# Patient Record
Sex: Female | Born: 1970
Health system: Southern US, Community
[De-identification: ages and names within clinical notes are randomized; demographics above are authoritative.]

## PROBLEM LIST (undated history)

## (undated) DIAGNOSIS — U071 COVID-19: Secondary | ICD-10-CM

## (undated) DIAGNOSIS — J189 Pneumonia, unspecified organism: Secondary | ICD-10-CM

## (undated) DIAGNOSIS — Z87442 Personal history of urinary calculi: Secondary | ICD-10-CM

## (undated) DIAGNOSIS — E785 Hyperlipidemia, unspecified: Secondary | ICD-10-CM

## (undated) DIAGNOSIS — R7303 Prediabetes: Secondary | ICD-10-CM

## (undated) DIAGNOSIS — B019 Varicella without complication: Secondary | ICD-10-CM

## (undated) DIAGNOSIS — M65312 Trigger thumb, left thumb: Secondary | ICD-10-CM

## (undated) DIAGNOSIS — K219 Gastro-esophageal reflux disease without esophagitis: Secondary | ICD-10-CM

## (undated) DIAGNOSIS — K769 Liver disease, unspecified: Secondary | ICD-10-CM

## (undated) HISTORY — DX: Hyperlipidemia, unspecified: E78.5

## (undated) HISTORY — DX: Prediabetes: R73.03

## (undated) HISTORY — DX: COVID-19: U07.1

## (undated) HISTORY — DX: Varicella without complication: B01.9

## (undated) HISTORY — PX: WISDOM TOOTH EXTRACTION: SHX21

## (undated) HISTORY — PX: ABDOMINAL HYSTERECTOMY: SHX81

## (undated) HISTORY — PX: OTHER SURGICAL HISTORY: SHX169

## (undated) HISTORY — DX: Liver disease, unspecified: K76.9

## (undated) HISTORY — PX: SHOULDER OPEN ROTATOR CUFF REPAIR: SHX2407

## (undated) HISTORY — PX: BACK SURGERY: SHX140

---

## 1994-03-24 DIAGNOSIS — Z8489 Family history of other specified conditions: Secondary | ICD-10-CM

## 1994-03-24 HISTORY — DX: Family history of other specified conditions: Z84.89

## 2006-06-23 ENCOUNTER — Ambulatory Visit: Payer: Self-pay | Admitting: Family Medicine

## 2006-08-26 ENCOUNTER — Ambulatory Visit: Payer: Self-pay | Admitting: Nurse Practitioner

## 2006-08-27 ENCOUNTER — Ambulatory Visit: Payer: Self-pay | Admitting: Family Medicine

## 2006-09-01 ENCOUNTER — Ambulatory Visit: Payer: Self-pay | Admitting: Nurse Practitioner

## 2006-11-12 ENCOUNTER — Ambulatory Visit: Payer: Self-pay | Admitting: Family Medicine

## 2008-04-13 ENCOUNTER — Ambulatory Visit: Payer: Self-pay | Admitting: Internal Medicine

## 2008-07-11 ENCOUNTER — Ambulatory Visit: Payer: Self-pay | Admitting: Family Medicine

## 2010-05-09 ENCOUNTER — Ambulatory Visit: Payer: Self-pay | Admitting: Family Medicine

## 2010-11-24 ENCOUNTER — Ambulatory Visit: Payer: Self-pay

## 2011-06-11 ENCOUNTER — Emergency Department: Payer: Self-pay | Admitting: Emergency Medicine

## 2011-06-11 LAB — COMPREHENSIVE METABOLIC PANEL
Albumin: 3.4 g/dL (ref 3.4–5.0)
Alkaline Phosphatase: 85 U/L (ref 50–136)
BUN: 12 mg/dL (ref 7–18)
Bilirubin,Total: 0.3 mg/dL (ref 0.2–1.0)
Chloride: 107 mmol/L (ref 98–107)
Creatinine: 0.81 mg/dL (ref 0.60–1.30)
Glucose: 98 mg/dL (ref 65–99)
SGPT (ALT): 19 U/L
Total Protein: 7.3 g/dL (ref 6.4–8.2)

## 2011-06-11 LAB — CK TOTAL AND CKMB (NOT AT ARMC)
CK, Total: 111 U/L (ref 21–215)
CK-MB: 0.8 ng/mL (ref 0.5–3.6)

## 2011-06-11 LAB — CBC
HGB: 14.2 g/dL (ref 12.0–16.0)
Platelet: 314 10*3/uL (ref 150–440)
RBC: 4.73 10*6/uL (ref 3.80–5.20)
WBC: 5.4 10*3/uL (ref 3.6–11.0)

## 2011-06-11 LAB — TROPONIN I: Troponin-I: <0.02 ng/mL

## 2011-06-11 LAB — PRO B NATRIURETIC PEPTIDE: B-Type Natriuretic Peptide: 20 pg/mL (ref 0–125)

## 2012-01-12 ENCOUNTER — Ambulatory Visit: Payer: Self-pay | Admitting: Internal Medicine

## 2013-01-04 ENCOUNTER — Emergency Department: Payer: Self-pay | Admitting: Emergency Medicine

## 2013-01-04 LAB — URINALYSIS, COMPLETE
Blood: NEGATIVE
Glucose,UR: NEGATIVE mg/dL (ref 0–75)
Leukocyte Esterase: NEGATIVE
Ph: 8 (ref 4.5–8.0)
Protein: NEGATIVE
RBC,UR: NONE SEEN /HPF (ref 0–5)
Specific Gravity: 1.004 (ref 1.003–1.030)
Squamous Epithelial: <1
WBC UR: NONE SEEN /HPF (ref 0–5)

## 2013-11-03 ENCOUNTER — Emergency Department: Payer: Self-pay | Admitting: Emergency Medicine

## 2013-11-04 LAB — CBC
HCT: 43.4 % (ref 35.0–47.0)
HGB: 14.3 g/dL (ref 12.0–16.0)
MCH: 29.5 pg (ref 26.0–34.0)
MCHC: 32.9 g/dL (ref 32.0–36.0)
MCV: 90 fL (ref 80–100)
Platelet: 299 10*3/uL (ref 150–440)
RBC: 4.84 10*6/uL (ref 3.80–5.20)
RDW: 13.8 % (ref 11.5–14.5)
WBC: 8.4 10*3/uL (ref 3.6–11.0)

## 2013-11-04 LAB — BASIC METABOLIC PANEL
ANION GAP: 8 (ref 7–16)
BUN: 13 mg/dL (ref 7–18)
Calcium, Total: 8.9 mg/dL (ref 8.5–10.1)
Chloride: 108 mmol/L — ABNORMAL HIGH (ref 98–107)
Co2: 24 mmol/L (ref 21–32)
Creatinine: 0.98 mg/dL (ref 0.60–1.30)
EGFR (African American): >60
EGFR (Non-African Amer.): >60
Glucose: 183 mg/dL — ABNORMAL HIGH (ref 65–99)
Osmolality: 284 (ref 275–301)
POTASSIUM: 3.7 mmol/L (ref 3.5–5.1)
Sodium: 140 mmol/L (ref 136–145)

## 2013-11-04 LAB — TROPONIN I: Troponin-I: <0.02 ng/mL

## 2015-02-01 LAB — HM MAMMOGRAPHY

## 2015-02-01 LAB — HM PAP SMEAR: HM Pap smear: NEGATIVE

## 2015-04-26 ENCOUNTER — Encounter: Payer: Self-pay | Admitting: Internal Medicine

## 2015-06-07 ENCOUNTER — Ambulatory Visit
Admission: RE | Admit: 2015-06-07 | Discharge: 2015-06-07 | Disposition: A | Discharge status: Home / Self Care | Payer: BLUE CROSS/BLUE SHIELD | Source: Ambulatory Visit | Attending: Urology | Admitting: Urology

## 2015-06-07 ENCOUNTER — Encounter: Payer: Self-pay | Admitting: *Deleted

## 2015-06-07 ENCOUNTER — Encounter
Admission: RE | Disposition: A | Discharge status: Home / Self Care | Payer: Self-pay | Source: Ambulatory Visit | Attending: Urology

## 2015-06-07 DIAGNOSIS — Z841 Family history of disorders of kidney and ureter: Secondary | ICD-10-CM | POA: Insufficient documentation

## 2015-06-07 DIAGNOSIS — Z8249 Family history of ischemic heart disease and other diseases of the circulatory system: Secondary | ICD-10-CM | POA: Insufficient documentation

## 2015-06-07 DIAGNOSIS — Z9889 Other specified postprocedural states: Secondary | ICD-10-CM | POA: Diagnosis not present

## 2015-06-07 DIAGNOSIS — N200 Calculus of kidney: Secondary | ICD-10-CM | POA: Diagnosis not present

## 2015-06-07 DIAGNOSIS — Z833 Family history of diabetes mellitus: Secondary | ICD-10-CM | POA: Diagnosis not present

## 2015-06-07 DIAGNOSIS — E7800 Pure hypercholesterolemia, unspecified: Secondary | ICD-10-CM | POA: Diagnosis not present

## 2015-06-07 DIAGNOSIS — Z79899 Other long term (current) drug therapy: Secondary | ICD-10-CM | POA: Insufficient documentation

## 2015-06-07 DIAGNOSIS — Z90710 Acquired absence of both cervix and uterus: Secondary | ICD-10-CM | POA: Diagnosis not present

## 2015-06-07 DIAGNOSIS — E119 Type 2 diabetes mellitus without complications: Secondary | ICD-10-CM | POA: Insufficient documentation

## 2015-06-07 DIAGNOSIS — F17200 Nicotine dependence, unspecified, uncomplicated: Secondary | ICD-10-CM | POA: Diagnosis not present

## 2015-06-07 HISTORY — PX: EXTRACORPOREAL SHOCK WAVE LITHOTRIPSY: SHX1557

## 2015-06-07 SURGERY — LITHOTRIPSY, ESWL
Anesthesia: Moderate Sedation | Laterality: Right

## 2015-06-07 MED ORDER — DOCUSATE SODIUM 100 MG PO CAPS: 200.0000 mg | ORAL_CAPSULE | Freq: Two times a day (BID) | ORAL | Status: DC

## 2015-06-07 MED ORDER — ONDANSETRON 8 MG PO TBDP: 8.0000 mg | ORAL_TABLET | Freq: Four times a day (QID) | ORAL | Status: DC | PRN

## 2015-06-07 MED ORDER — FUROSEMIDE 10 MG/ML IJ SOLN: 10.0000 mg | Freq: Once | INTRAMUSCULAR | Status: DC

## 2015-06-07 MED ORDER — DIPHENHYDRAMINE HCL 25 MG PO CAPS
ORAL_CAPSULE | ORAL | Status: AC
  Filled 2015-06-07: qty 1

## 2015-06-07 MED ORDER — MIDAZOLAM HCL 2 MG/2ML IJ SOLN
INTRAMUSCULAR | Status: AC
  Filled 2015-06-07: qty 2

## 2015-06-07 MED ORDER — PROMETHAZINE HCL 25 MG/ML IJ SOLN
INTRAMUSCULAR | Status: AC
  Filled 2015-06-07: qty 1

## 2015-06-07 MED ORDER — LEVOFLOXACIN 500 MG PO TABS
500.0000 mg | ORAL_TABLET | Freq: Once | ORAL | Status: AC
Start: 2015-06-07 — End: 2015-06-07
  Administered 2015-06-07: 500 mg via ORAL

## 2015-06-07 MED ORDER — PROMETHAZINE HCL 25 MG/ML IJ SOLN
25.0000 mg | Freq: Once | INTRAMUSCULAR | Status: AC
  Administered 2015-06-07: 25 mg via INTRAMUSCULAR

## 2015-06-07 MED ORDER — TAMSULOSIN HCL 0.4 MG PO CAPS: 0.4000 mg | ORAL_CAPSULE | Freq: Every day | ORAL | Status: DC

## 2015-06-07 MED ORDER — DIPHENHYDRAMINE HCL 25 MG PO CAPS
25.0000 mg | ORAL_CAPSULE | ORAL | Status: AC
  Administered 2015-06-07: 25 mg via ORAL

## 2015-06-07 MED ORDER — LEVOFLOXACIN 500 MG PO TABS
ORAL_TABLET | ORAL | Status: AC
  Filled 2015-06-07: qty 1

## 2015-06-07 MED ORDER — MIDAZOLAM HCL 2 MG/2ML IJ SOLN
1.0000 mg | Freq: Once | INTRAMUSCULAR | Status: AC
  Administered 2015-06-07: 1 mg via INTRAMUSCULAR

## 2015-06-07 MED ORDER — NUCYNTA 50 MG PO TABS: 50.0000 mg | ORAL_TABLET | Freq: Four times a day (QID) | ORAL | Status: DC | PRN

## 2015-06-07 MED ORDER — LEVOFLOXACIN 500 MG PO TABS: 500.0000 mg | ORAL_TABLET | Freq: Every day | ORAL | Status: DC

## 2015-06-07 MED ORDER — DEXTROSE-NACL 5-0.45 % IV SOLN
INTRAVENOUS | Status: DC
  Administered 2015-06-07: 12:00:00 via INTRAVENOUS

## 2015-06-07 MED ORDER — MORPHINE SULFATE (PF) 10 MG/ML IV SOLN
10.0000 mg | Freq: Once | INTRAVENOUS | Status: AC
Start: 2015-06-07 — End: 2015-06-07
  Administered 2015-06-07: 10 mg via INTRAMUSCULAR

## 2015-06-07 MED ORDER — MORPHINE SULFATE (PF) 10 MG/ML IV SOLN
INTRAVENOUS | Status: AC
  Filled 2015-06-07: qty 1

## 2015-06-07 NOTE — Discharge Instructions (Signed)
AMBULATORY SURGERY  DISCHARGE INSTRUCTIONS   1) The drugs that you were given will stay in your system until tomorrow so for the next 24 hours you should not:  A) Drive an automobile B) Make any legal decisions C) Drink any alcoholic beverage   2) You may resume regular meals tomorrow.  Today it is better to start with liquids and gradually work up to solid foods.  You may eat anything you prefer, but it is better to start with liquids, then soup and crackers, and gradually work up to solid foods.   3) Please notify your doctor immediately if you have any unusual bleeding, trouble breathing, redness and pain at the surgery site, drainage, fever, or pain not relieved by medication.    4) Additional Instructions:        Please contact your physician with any problems or Same Day Surgery at [phone], Monday through Friday 6 am to 4 pm, or Joanna at [facility] number at [phone]. Dietary Guidelines to Help Prevent Kidney Stones Your risk of kidney stones can be decreased by adjusting the foods you eat. The most important thing you can do is drink enough fluid. You should drink enough fluid to keep your urine clear or pale yellow. The following guidelines provide specific information for the type of kidney stone you have had. GUIDELINES ACCORDING TO TYPE OF KIDNEY STONE Calcium Oxalate Kidney Stones  Reduce the amount of salt you eat. Foods that have a lot of salt cause your body to release excess calcium into your urine. The excess calcium can combine with a substance called oxalate to form kidney stones.  Reduce the amount of animal protein you eat if the amount you eat is excessive. Animal protein causes your body to release excess calcium into your urine. Ask your dietitian how much protein from animal sources you should be eating.  Avoid foods that are high in oxalates. If you take vitamins, they should have less than 500 mg of vitamin C. Your body turns vitamin  C into oxalates. You do not need to avoid fruits and vegetables high in vitamin C. Calcium Phosphate Kidney Stones  Reduce the amount of salt you eat to help prevent the release of excess calcium into your urine.  Reduce the amount of animal protein you eat if the amount you eat is excessive. Animal protein causes your body to release excess calcium into your urine. Ask your dietitian how much protein from animal sources you should be eating.  Get enough calcium from food or take a calcium supplement (ask your dietitian for recommendations). Food sources of calcium that do not increase your risk of kidney stones include:  Broccoli.  Dairy products, such as cheese and yogurt.  Pudding. Uric Acid Kidney Stones  Do not have more than 6 oz of animal protein per day. FOOD SOURCES Animal Protein Sources  Meat (all types).  Poultry.  Eggs.  Fish, seafood. Foods High in [employer] seasonings.  Soy sauce.  Teriyaki sauce.  Cured and processed meats.  Salted crackers and snack foods.  Fast food.  Canned soups and most canned foods. Foods High in Oxalates  Grains:  Amaranth.  Barley.  Grits.  Wheat germ.  Bran.  Buckwheat flour.  All bran cereals.  Pretzels.  Whole wheat bread.  Vegetables:  Beans (wax).  Beets and beet greens.  Collard greens.  Eggplant.  [place].  Leeks.  Okra.  Parsley.  Rutabagas.  Spinach.  Swiss chard.  Tomato paste.  Fried potatoes.  Sweet potatoes.  Fruits:  Red currants.  Figs.  Kiwi.  Rhubarb.  Meat and Other Protein Sources:  Beans (dried).  Soy burgers and other soybean products.  Miso.  Nuts (peanuts, almonds, pecans, cashews, hazelnuts).  Nut butters.  Sesame seeds and tahini (paste made of sesame seeds).  Poppy seeds.  Beverages:  Chocolate drink mixes.  Soy milk.  Instant iced tea.  Juices made from high-oxalate fruits or  vegetables.  Other:  Carob.  Chocolate.  Fruitcake.  Marmalades.   This information is not intended to replace advice given to you by your health care provider. Make sure you discuss any questions you have with your health care provider.   Document Released: 07/05/2010 Document Revised: 03/15/2013 Document Reviewed: 02/04/2013 Elsevier Interactive Patient Education 2016 Elsevier Inc.  Kidney Stones Kidney stones (urolithiasis) are deposits that form inside your kidneys. The intense pain is caused by the stone moving through the urinary tract. When the stone moves, the ureter goes into spasm around the stone. The stone is usually passed in the urine.  CAUSES   A disorder that makes certain neck glands produce too much parathyroid hormone (primary hyperparathyroidism).  A buildup of uric acid crystals, similar to gout in your joints.  Narrowing (stricture) of the ureter.  A kidney obstruction present at birth (congenital obstruction).  Previous surgery on the kidney or ureters.  Numerous kidney infections. SYMPTOMS   Feeling sick to your stomach (nauseous).  Throwing up (vomiting).  Blood in the urine (hematuria).  Pain that usually spreads (radiates) to the groin.  Frequency or urgency of urination. DIAGNOSIS   Taking a history and physical exam.  Blood or urine tests.  CT scan.  Occasionally, an examination of the inside of the urinary bladder (cystoscopy) is performed. TREATMENT   Observation.  Increasing your fluid intake.  Extracorporeal shock wave lithotripsy--This is a noninvasive procedure that uses shock waves to break up kidney stones.  Surgery may be needed if you have severe pain or persistent obstruction. There are various surgical procedures. Most of the procedures are performed with the use of small instruments. Only small incisions are needed to accommodate these instruments, so recovery time is minimized. The size, location, and chemical  composition are all important variables that will determine the proper choice of action for you. Talk to your health care provider to better understand your situation so that you will minimize the risk of injury to yourself and your kidney.  HOME CARE INSTRUCTIONS   Drink enough water and fluids to keep your urine clear or pale yellow. This will help you to pass the stone or stone fragments.  Strain all urine through the provided strainer. Keep all particulate matter and stones for your health care provider to see. The stone causing the pain may be as small as a grain of salt. It is very important to use the strainer each and every time you pass your urine. The collection of your stone will allow your health care provider to analyze it and verify that a stone has actually passed. The stone analysis will often identify what you can do to reduce the incidence of recurrences.  Only take over-the-counter or prescription medicines for pain, discomfort, or fever as directed by your health care provider.  Keep all follow-up visits as told by your health care provider. This is important.  Get follow-up X-rays if required. The absence of pain does not always mean that the stone has passed. It may have only  stopped moving. If the urine remains completely obstructed, it can cause loss of kidney function or even complete destruction of the kidney. It is your responsibility to make sure X-rays and follow-ups are completed. Ultrasounds of the kidney can show blockages and the status of the kidney. Ultrasounds are not associated with any radiation and can be performed easily in a matter of minutes.  Make changes to your daily diet as told by your health care provider. You may be told to:  Limit the amount of salt that you eat.  Eat 5 or more servings of fruits and vegetables each day.  Limit the amount of meat, poultry, fish, and eggs that you eat.  Collect a 24-hour urine sample as told by your health care  provider.You may need to collect another urine sample every 6-12 months. SEEK MEDICAL CARE IF:  You experience pain that is progressive and unresponsive to any pain medicine you have been prescribed. SEEK IMMEDIATE MEDICAL CARE IF:   Pain cannot be controlled with the prescribed medicine.  You have a fever or shaking chills.  The severity or intensity of pain increases over 18 hours and is not relieved by pain medicine.  You develop a new onset of abdominal pain.  You feel faint or pass out.  You are unable to urinate.   This information is not intended to replace advice given to you by your health care provider. Make sure you discuss any questions you have with your health care provider.   Document Released: 03/10/2005 Document Revised: 11/29/2014 Document Reviewed: 08/11/2012 Elsevier Interactive Patient Education 2016 [place].  Kidney Stones Kidney stones (urolithiasis) are solid masses that form inside your kidneys. The intense pain is caused by the stone moving through the kidney, ureter, bladder, and urethra (urinary tract). When the stone moves, the ureter starts to spasm around the stone. The stone is usually passed in your pee (urine).  HOME CARE  Drink enough fluids to keep your pee clear or pale yellow. This helps to get the stone out.  Take a 24-hour pee (urine) sample as told by your doctor. You may need to take another sample every 6-12 months.  Strain all pee through the provided strainer. Do not pee without peeing through the strainer, not even once. If you pee the stone out, catch it in the strainer. The stone may be as small as a grain of salt. Take this to your doctor. This will help your doctor figure out what you can do to try to prevent more kidney stones.  Only take medicine as told by your doctor.  Make changes to your daily diet as told by your doctor. You may be told to:  Limit how much salt you eat.  Eat 5 or more servings of fruits and  vegetables each day.  Limit how much meat, poultry, fish, and eggs you eat.  Keep all follow-up visits as told by your doctor. This is important.  Get follow-up X-rays as told by your doctor. GET HELP IF: You have pain that gets worse even if you have been taking pain medicine. GET HELP RIGHT AWAY IF:   Your pain does not get better with medicine.  You have a fever or shaking chills.  Your pain increases and gets worse over 18 hours.  You have new belly (abdominal) pain.  You feel faint or pass out.  You are unable to pee.   This information is not intended to replace advice given to you by your health  care provider. Make sure you discuss any questions you have with your health care provider.   Document Released: 08/27/2007 Document Revised: 11/29/2014 Document Reviewed: 08/11/2012 Elsevier Interactive Patient Education 2016 [place], Care After Refer to this sheet in the next few weeks. These instructions provide you with information on caring for yourself after your procedure. Your health care provider may also give you more specific instructions. Your treatment has been planned according to current medical practices, but problems sometimes occur. Call your health care provider if you have any problems or questions after your procedure. WHAT TO EXPECT AFTER THE PROCEDURE   Your urine may have a red tinge for a few days after treatment. Blood loss is usually minimal.  You may have soreness in the back or flank area. This usually goes away after a few days. The procedure can cause blotches or bruises on the back where the pressure wave enters the skin. These marks usually cause only minimal discomfort and should disappear in a short time.  Stone fragments should begin to pass within 24 hours of treatment. However, a delayed passage is not unusual.  You may have pain, discomfort, and feel sick to your stomach (nauseated) when the crushed fragments of stone are  passed down the tube from the kidney to the bladder. Stone fragments can pass soon after the procedure and may last for up to 4-8 weeks.  A small number of patients may have severe pain when stone fragments are not able to pass, which leads to an obstruction.  If your stone is greater than 1 inch (2.5 cm) in diameter or if you have multiple stones that have a combined diameter greater than 1 inch (2.5 cm), you may require more than one treatment.  If you had a stent placed prior to your procedure, you may experience some discomfort, especially during urination. You may experience the pain or discomfort in your flank or back, or you may experience a sharp pain or discomfort at the base of your penis or in your lower abdomen. The discomfort usually lasts only a few minutes after urinating. HOME CARE INSTRUCTIONS   Rest at home until you feel your energy improving.  Only take over-the-counter or prescription medicines for pain, discomfort, or fever as directed by your health care provider. Depending on the type of lithotripsy, you may need to take antibiotics and anti-inflammatory medicines for a few days.  Drink enough water and fluids to keep your urine clear or pale yellow. This helps "flush" your kidneys. It helps pass any remaining pieces of stone and prevents stones from coming back.  Most people can resume daily activities within 1-2 days after standard lithotripsy. It can take longer to recover from laser and percutaneous lithotripsy.  Strain all urine through the provided strainer. Keep all particulate matter and stones for your health care provider to see. The stone may be as small as a grain of salt. It is very important to use the strainer each and every time you pass your urine. Any stones that are found can be sent to a medical lab for examination.  Visit your health care provider for a follow-up appointment in a few weeks. Your doctor may remove your stent if you have one. Your health  care provider will also check to see whether stone particles still remain. SEEK MEDICAL CARE IF:   Your pain is not relieved by medicine.  You have a lasting nauseous feeling.  You feel there is too much blood  in the urine.  You develop persistent problems with frequent or painful urination that does not at least partially improve after 2 days following the procedure.  You have a congested cough.  You feel lightheaded.  You develop a rash or any other signs that might suggest an allergic problem.  You develop any reaction or side effects to your medicine(s). SEEK IMMEDIATE MEDICAL CARE IF:   You experience severe back or flank pain or both.  You see nothing but blood when you urinate.  You cannot pass any urine at all.  You have a fever or shaking chills.  You develop shortness of breath, difficulty breathing, or chest pain.  You develop vomiting that will not stop after 6-8 hours.  You have a fainting episode.   This information is not intended to replace advice given to you by your health care provider. Make sure you discuss any questions you have with your health care provider.   Document Released: 03/30/2007 Document Revised: 11/29/2014 Document Reviewed: 09/23/2012 Elsevier Interactive Patient Education [employer].  Lithotripsy Lithotripsy is a treatment that can sometimes help eliminate kidney stones and pain that they cause. A form of lithotripsy, also known as extracorporeal shock wave lithotripsy, is a nonsurgical procedure that helps your body rid itself of the kidney stone when it is too big to pass on its own. Extracorporeal shock wave lithotripsy is a method of crushing a kidney stone with shock waves. These shock waves pass through your body and are focused on your stone. They cause the kidney stones to crumble while still in the urinary tract. It is then easier for the smaller pieces of stone to pass in the urine. Lithotripsy usually takes about an  hour. It is done in a hospital, a lithotripsy center, or a mobile unit. It usually does not require an overnight stay. Your health care provider will instruct you on preparation for the procedure. Your health care provider will tell you what to expect afterward. LET [facility] CARE PROVIDER KNOW ABOUT:  Any allergies you have.  All medicines you are taking, including vitamins, herbs, eye drops, creams, and over-the-counter medicines.  Previous problems you or members of your family have had with the use of anesthetics.  Any blood disorders you have.  Previous surgeries you have had.  Medical conditions you have. RISKS AND COMPLICATIONS Generally, lithotripsy for kidney stones is a safe procedure. However, as with any procedure, complications can occur. Possible complications include:  Infection.  Bleeding of the kidney.  Bruising of the kidney or skin.  Obstruction of the ureter.  Failure of the stone to fragment. BEFORE THE PROCEDURE  Do not eat or drink for 6-8 hours prior to the procedure. You may, however, take the medications with a sip of water that your physician instructs you to take  Do not take aspirin or aspirin-containing products for 7 days prior to your procedure  Do not take nonsteroidal anti-inflammatory products for 7 days prior to your procedure PROCEDURE A stent (flexible tube with holes) may be placed in your ureter. The ureter is the tube that transports the urine from the kidneys to the bladder. Your health care provider may place a stent before the procedure. This will help keep urine flowing from the kidney if the fragments of the stone block the ureter. You may have an IV tube placed in one of your veins to give you fluids and medicines. These medicines may help you relax or make you sleep. During the procedure,  you will lie comfortably on a fluid-filled cushion or in a warm-water bath. After an X-ray or ultrasound exam to locate your stone, shock waves are  aimed at the stone. If you are awake, you may feel a tapping sensation as the shock waves pass through your body. If large stone particles remain after treatment, a second procedure may be necessary at a later date. For comfort during the test:  Relax as much as possible.  Try to remain still as much as possible.  Try to follow instructions to speed up the test.  Let your health care provider know if you are uncomfortable, anxious, or in pain. AFTER THE PROCEDURE  After surgery, you will be taken to the recovery area. A nurse will watch and check your progress. Once you're awake, stable, and taking fluids well, you will be allowed to go home as long as there are no problems. You will also be allowed to pass your urine before discharge.You may be given antibiotics to help prevent infection. You may also be prescribed pain medicine if needed. In a week or two, your health care provider may remove your stent, if you have one. You may first have an X-ray exam to check on how successful the fragmentation of your stone has been and how much of the stone has passed. Your health care provider will check to see whether or not stone particles remain. SEEK IMMEDIATE MEDICAL CARE IF:  You develop a fever or shaking chills.  Your pain is not relieved by medicine.  You feel sick to your stomach (nauseated) and you vomit.  You develop heavy bleeding.  You have difficulty urinating.  You start to pass your stent from your penis.   This information is not intended to replace advice given to you by your health care provider. Make sure you discuss any questions you have with your health care provider.   Document Released: 03/07/2000 Document Revised: 03/31/2014 Document Reviewed: 09/23/2012 Elsevier Interactive Patient Education 2016 [place].  Renal Colic Renal colic is pain that is caused by passing a kidney stone. The pain can be sharp and severe. It may be felt in the back, abdomen, side  (flank), or groin. It can cause nausea. Renal colic can come and go. HOME CARE INSTRUCTIONS Watch your condition for any changes. The following actions may help to lessen any discomfort that you are feeling:  Take medicines only as directed by your health care provider.  Ask your health care provider if it is okay to take over-the-counter pain medicine.  Drink enough fluid to keep your urine clear or pale yellow. Drink 6-8 glasses of water each day.  Limit the amount of salt that you eat to less than 2 grams per day.  Reduce the amount of protein in your diet. Eat less meat, fish, nuts, and dairy.  Avoid foods such as spinach, rhubarb, nuts, or bran. These may make kidney stones more likely to form. SEEK MEDICAL CARE IF:  You have a fever or chills.  Your urine smells bad or looks cloudy.  You have pain or burning when you pass urine. SEEK IMMEDIATE MEDICAL CARE IF:  Your flank pain or groin pain suddenly worsens.  You become confused or disoriented or you lose consciousness.   This information is not intended to replace advice given to you by your health care provider. Make sure you discuss any questions you have with your health care provider.   Document Released: 12/18/2004 Document Revised: 03/31/2014 Document Reviewed: 01/18/2014  Elsevier Interactive Patient Education ©2016 Elsevier Inc. ° °

## 2015-06-29 ENCOUNTER — Other Ambulatory Visit (HOSPITAL_COMMUNITY): Payer: Self-pay | Admitting: Orthopedic Surgery

## 2015-06-29 ENCOUNTER — Other Ambulatory Visit: Payer: Self-pay | Admitting: Orthopedic Surgery

## 2015-06-29 DIAGNOSIS — M545 Low back pain: Secondary | ICD-10-CM

## 2015-07-12 ENCOUNTER — Encounter (HOSPITAL_COMMUNITY): Payer: Self-pay

## 2015-07-12 ENCOUNTER — Encounter (HOSPITAL_COMMUNITY)
Admission: RE | Admit: 2015-07-12 | Discharge: 2015-07-12 | Disposition: A | Discharge status: Home / Self Care | Payer: BLUE CROSS/BLUE SHIELD | Source: Ambulatory Visit | Attending: Orthopedic Surgery | Admitting: Orthopedic Surgery

## 2015-07-12 DIAGNOSIS — Z01818 Encounter for other preprocedural examination: Secondary | ICD-10-CM | POA: Insufficient documentation

## 2015-07-12 DIAGNOSIS — M545 Low back pain: Secondary | ICD-10-CM | POA: Diagnosis not present

## 2015-07-12 HISTORY — DX: Gastro-esophageal reflux disease without esophagitis: K21.9

## 2015-07-12 HISTORY — DX: Pneumonia, unspecified organism: J18.9

## 2015-07-12 NOTE — Pre-Procedure Instructions (Signed)
Lori Lang  07/12/2015      [facility] [id number] - [place], [place] AT [facility] OF [address] [place] [address] Phone: [phone] Fax: [phone]    Your procedure is scheduled on April 27  Report to [facility] at 600 Am   Call this number if you have problems the morning of surgery:  [phone]   Remember:  Do not eat food or drink liquids after midnight.  Take these medicines the morning of surgery with A SIP OF WATER  Oxycodone-acetaminophen (Percocet) if needed   Stop taking  Aspirin, Aleve, Ibuprofen, Advil, motrin, Herbal medications, Fish oilt   Do not wear jewelry, make-up or nail polish.  Do not wear lotions, powders, or perfumes.  You may wear deodorant.  Do not shave 48 hours prior to surgery.  Men may shave face and neck.  Do not bring valuables to the hospital.  [facility] is not responsible for any belongings or valuables.  Contacts, dentures or bridgework may not be worn into surgery.  Leave your suitcase in the car.  After surgery it may be brought to your room.  For patients admitted to the hospital, discharge time will be determined by your treatment team.  Patients discharged the day of surgery will not be allowed to drive home.    Special instructions:  [facility] - Preparing for Surgery  Before surgery, you can play an important role.  Because skin is not sterile, your skin needs to be as free of germs as possible.  You can reduce the number of germs on you skin by washing with CHG (chlorahexidine gluconate) soap before surgery.  CHG is an antiseptic cleaner which kills germs and bonds with the skin to continue killing germs even after washing.  Please DO NOT use if you have an allergy to CHG or antibacterial soaps.  If your skin becomes reddened/irritated stop using the CHG and inform your nurse when you arrive at Short Stay.  Do not shave (including legs and  underarms) for at least 48 hours prior to the first CHG shower.  You may shave your face.  Please follow these instructions carefully:   1.  Shower with CHG Soap the night before surgery and the                                morning of Surgery.  2.  If you choose to wash your hair, wash your hair first as usual with your       normal shampoo.  3.  After you shampoo, rinse your hair and body thoroughly to remove the                      Shampoo.  4.  Use CHG as you would any other liquid soap.  You can apply chg directly       to the skin and wash gently with scrungie or a clean washcloth.  5.  Apply the CHG Soap to your body ONLY FROM THE NECK DOWN.        Do not use on open wounds or open sores.  Avoid contact with your eyes,       ears, mouth and genitals (private parts).  Wash genitals (private parts)       with your normal soap.  6.  Wash thoroughly, paying special attention to the area where your surgery        will be performed.  7.  Thoroughly rinse your body with warm water from the neck down.  8.  DO NOT shower/wash with your normal soap after using and rinsing off       the CHG Soap.  9.  Pat yourself dry with a clean towel.            10.  Wear clean pajamas.            11.  Place clean sheets on your bed the night of your first shower and do not        sleep with pets.  Day of Surgery  Do not apply any lotions/deoderants the morning of surgery.  Please wear clean clothes to the hospital/surgery center.     Please read over the following fact sheets that you were given. Pain Booklet, Coughing and Deep Breathing, MRSA Information and Surgical Site Infection Prevention

## 2015-07-12 NOTE — Progress Notes (Signed)
PCP is Dr Geroge Baseman with [facility]  Denies ever seeing a cardiologist Denies ever having a card cath, stress test, or echo.

## 2015-07-19 ENCOUNTER — Ambulatory Visit (HOSPITAL_COMMUNITY): Payer: BLUE CROSS/BLUE SHIELD | Admitting: Certified Registered Nurse Anesthetist

## 2015-07-19 ENCOUNTER — Ambulatory Visit (HOSPITAL_COMMUNITY)
Admission: RE | Admit: 2015-07-19 | Discharge: 2015-07-19 | Disposition: A | Discharge status: Home / Self Care | Payer: BLUE CROSS/BLUE SHIELD | Source: Ambulatory Visit | Attending: Orthopedic Surgery | Admitting: Orthopedic Surgery

## 2015-07-19 ENCOUNTER — Encounter (HOSPITAL_COMMUNITY): Payer: Self-pay | Admitting: *Deleted

## 2015-07-19 ENCOUNTER — Encounter (HOSPITAL_COMMUNITY)
Admission: RE | Disposition: A | Discharge status: Home / Self Care | Payer: Self-pay | Source: Ambulatory Visit | Attending: Orthopedic Surgery

## 2015-07-19 DIAGNOSIS — M5127 Other intervertebral disc displacement, lumbosacral region: Secondary | ICD-10-CM | POA: Insufficient documentation

## 2015-07-19 DIAGNOSIS — M545 Low back pain: Secondary | ICD-10-CM

## 2015-07-19 DIAGNOSIS — F17200 Nicotine dependence, unspecified, uncomplicated: Secondary | ICD-10-CM | POA: Diagnosis not present

## 2015-07-19 DIAGNOSIS — Z6832 Body mass index (BMI) 32.0-32.9, adult: Secondary | ICD-10-CM | POA: Diagnosis not present

## 2015-07-19 HISTORY — PX: RADIOLOGY WITH ANESTHESIA: SHX6223

## 2015-07-19 SURGERY — RADIOLOGY WITH ANESTHESIA
Anesthesia: Monitor Anesthesia Care

## 2015-07-19 MED ORDER — LACTATED RINGERS IV SOLN
INTRAVENOUS | Status: DC
  Administered 2015-07-19: 07:00:00 via INTRAVENOUS

## 2015-07-19 NOTE — Progress Notes (Signed)
Nose ring noted- instructed to remove before going into MRI

## 2015-07-19 NOTE — Anesthesia Preprocedure Evaluation (Addendum)
Anesthesia Evaluation  Patient identified by MRN, date of birth, ID band Patient awake    Reviewed: Allergy & Precautions, NPO status , Patient's Chart, lab work & pertinent test results  History of Anesthesia Complications Negative for: history of anesthetic complications  Airway Mallampati: II  TM Distance: >3 FB Neck ROM: Full    Dental  (+) Teeth Intact   Pulmonary neg shortness of breath, neg sleep apnea, neg COPD, neg recent URI, Current Smoker,    breath sounds clear to auscultation       Cardiovascular negative cardio ROS   Rhythm:Regular     Neuro/Psych Low back pain negative psych ROS   GI/Hepatic negative GI ROS, Neg liver ROS,   Endo/Other  Morbid obesity  Renal/GU negative Renal ROS     Musculoskeletal   Abdominal   Peds  Hematology negative hematology ROS (+)   Anesthesia Other Findings   Reproductive/Obstetrics                            Anesthesia Physical Anesthesia Plan  ASA: II  Anesthesia Plan: MAC   Post-op Pain Management:    Induction: Intravenous  Airway Management Planned: Natural Airway, Nasal Cannula and Simple Face Mask  Additional Equipment: None  Intra-op Plan:   Post-operative Plan:   Informed Consent: I have reviewed the patients History and Physical, chart, labs and discussed the procedure including the risks, benefits and alternatives for the proposed anesthesia with the patient or authorized representative who has indicated his/her understanding and acceptance.   Dental advisory given  Plan Discussed with: CRNA and Surgeon  Anesthesia Plan Comments:        Anesthesia Quick Evaluation

## 2015-07-19 NOTE — Transfer of Care (Signed)
Immediate Anesthesia Transfer of Care Note  Patient: Lori Lang  Procedure(s) Performed: Procedure(s): MRI LUMBER SPIN WITHOUT (N/A)  Patient Location: PACU  Anesthesia Type:MAC  Level of Consciousness: awake, alert , oriented, patient cooperative and responds to stimulation  Airway & Oxygen Therapy: Patient Spontanous Breathing and Patient connected to nasal cannula oxygen  Post-op Assessment: Report given to RN, Post -op Vital signs reviewed and stable, Patient moving all extremities and Patient moving all extremities X 4  Post vital signs: Reviewed and stable  Last Vitals:  Filed Vitals:   07/19/15 0644  BP: 132/80  Pulse: 70  Temp: 36.6 C  Resp: 20    Last Pain:  Filed Vitals:   07/19/15 0921  PainSc: 3       Patients Stated Pain Goal: 7 ([id number])  Complications: No apparent anesthesia complications

## 2015-07-20 ENCOUNTER — Encounter (HOSPITAL_COMMUNITY): Payer: Self-pay | Admitting: Radiology

## 2015-07-25 MED FILL — Midazolam HCl Inj 5 MG/5ML (Base Equivalent): INTRAMUSCULAR | Qty: 5 | Status: AC

## 2015-07-25 MED FILL — Fentanyl Citrate Preservative Free (PF) Inj 100 MCG/2ML: INTRAMUSCULAR | Qty: 4 | Status: AC

## 2015-10-06 ENCOUNTER — Emergency Department: Payer: BLUE CROSS/BLUE SHIELD

## 2015-10-06 ENCOUNTER — Emergency Department
Admission: EM | Admit: 2015-10-06 | Discharge: 2015-10-07 | Disposition: A | Discharge status: Home / Self Care | Payer: BLUE CROSS/BLUE SHIELD | Attending: Emergency Medicine | Admitting: Emergency Medicine

## 2015-10-06 ENCOUNTER — Encounter: Payer: Self-pay | Admitting: Emergency Medicine

## 2015-10-06 DIAGNOSIS — N200 Calculus of kidney: Secondary | ICD-10-CM | POA: Diagnosis not present

## 2015-10-06 DIAGNOSIS — Z8719 Personal history of other diseases of the digestive system: Secondary | ICD-10-CM | POA: Diagnosis not present

## 2015-10-06 DIAGNOSIS — R1032 Left lower quadrant pain: Secondary | ICD-10-CM | POA: Diagnosis present

## 2015-10-06 DIAGNOSIS — Z79899 Other long term (current) drug therapy: Secondary | ICD-10-CM | POA: Insufficient documentation

## 2015-10-06 DIAGNOSIS — F17210 Nicotine dependence, cigarettes, uncomplicated: Secondary | ICD-10-CM | POA: Diagnosis not present

## 2015-10-06 LAB — CBC
HEMATOCRIT: 45.4 % (ref 35.0–47.0)
HEMOGLOBIN: 15.5 g/dL (ref 12.0–16.0)
MCH: 30.3 pg (ref 26.0–34.0)
MCHC: 34.2 g/dL (ref 32.0–36.0)
MCV: 88.5 fL (ref 80.0–100.0)
Platelets: 334 10*3/uL (ref 150–440)
RBC: 5.13 MIL/uL (ref 3.80–5.20)
RDW: 13.9 % (ref 11.5–14.5)
WBC: 9.7 10*3/uL (ref 3.6–11.0)

## 2015-10-06 LAB — BASIC METABOLIC PANEL
ANION GAP: 10 (ref 5–15)
BUN: 14 mg/dL (ref 6–20)
CHLORIDE: 107 mmol/L (ref 101–111)
CO2: 21 mmol/L — AB (ref 22–32)
Calcium: 9.1 mg/dL (ref 8.9–10.3)
Creatinine, Ser: 0.85 mg/dL (ref 0.44–1.00)
GFR calc non Af Amer: >60 mL/min (ref >60)
GLUCOSE: 127 mg/dL — AB (ref 65–99)
POTASSIUM: 4 mmol/L (ref 3.5–5.1)
Sodium: 138 mmol/L (ref 135–145)

## 2015-10-06 LAB — URINALYSIS COMPLETE WITH MICROSCOPIC (ARMC ONLY)
BILIRUBIN URINE: NEGATIVE
GLUCOSE, UA: NEGATIVE mg/dL
KETONES UR: NEGATIVE mg/dL
LEUKOCYTES UA: NEGATIVE
Nitrite: NEGATIVE
Protein, ur: 30 mg/dL — AB
Specific Gravity, Urine: 1.021 (ref 1.005–1.030)
pH: 8 (ref 5.0–8.0)

## 2015-10-06 MED ORDER — KETOROLAC TROMETHAMINE 30 MG/ML IJ SOLN
30.0000 mg | Freq: Once | INTRAMUSCULAR | Status: AC
  Administered 2015-10-06: 30 mg via INTRAVENOUS
  Filled 2015-10-06: qty 1

## 2015-10-06 MED ORDER — MORPHINE SULFATE (PF) 4 MG/ML IV SOLN
INTRAVENOUS | Status: DC
Start: 2015-10-06 — End: 2015-10-07
  Filled 2015-10-06: qty 1

## 2015-10-06 MED ORDER — MORPHINE SULFATE (PF) 4 MG/ML IV SOLN
4.0000 mg | Freq: Once | INTRAVENOUS | Status: AC
  Administered 2015-10-06: 4 mg via INTRAVENOUS

## 2015-10-06 MED ORDER — FENTANYL CITRATE (PF) 100 MCG/2ML IJ SOLN
50.0000 ug | INTRAMUSCULAR | Status: DC | PRN
  Administered 2015-10-06: 50 ug via INTRAVENOUS
  Filled 2015-10-06: qty 2

## 2015-10-06 MED ORDER — ONDANSETRON HCL 4 MG/2ML IJ SOLN
4.0000 mg | Freq: Once | INTRAMUSCULAR | Status: AC
  Administered 2015-10-06: 4 mg via INTRAVENOUS
  Filled 2015-10-06: qty 2

## 2015-10-06 NOTE — ED Notes (Signed)
Pt states had back surgery, microdiscetomy on 06/27. Pt states onset of sudden left lower back pain radiating to left groin approx 2 hours pta. Pt states has had chills, denies nausea, vomiting, diarrhea. Pt denies shob, but is hyperventilating in triage. Pt with history of renal calculi, but states this pain feels different. Cms intact to all extremities.

## 2015-10-06 NOTE — ED Notes (Addendum)
Patient assisted out of the car to a wheelchair. States she has had two hours of left back and flank pain that radiates down into her groin. Back surgery two weeks ago.

## 2015-10-07 MED ORDER — OXYCODONE-ACETAMINOPHEN 5-325 MG PO TABS
1.0000 | ORAL_TABLET | Freq: Once | ORAL | Status: AC
  Administered 2015-10-07: 1 via ORAL

## 2015-10-07 MED ORDER — TAMSULOSIN HCL 0.4 MG PO CAPS
ORAL_CAPSULE | ORAL | Status: AC
  Administered 2015-10-07: 0.4 mg via ORAL
  Filled 2015-10-07: qty 1

## 2015-10-07 MED ORDER — OXYCODONE-ACETAMINOPHEN 5-325 MG PO TABS: 1.0000 | ORAL_TABLET | ORAL | Status: DC | PRN

## 2015-10-07 MED ORDER — TAMSULOSIN HCL 0.4 MG PO CAPS
0.4000 mg | ORAL_CAPSULE | Freq: Once | ORAL | Status: AC
Start: 2015-10-07 — End: 2015-10-07
  Administered 2015-10-07: 0.4 mg via ORAL

## 2015-10-07 MED ORDER — OXYCODONE-ACETAMINOPHEN 5-325 MG PO TABS
ORAL_TABLET | ORAL | Status: AC
  Administered 2015-10-07: 1 via ORAL
  Filled 2015-10-07: qty 1

## 2015-10-07 NOTE — ED Provider Notes (Signed)
[facility] Emergency Department Provider Note  ____________________________________________  Time seen: 12:05 AM  I have reviewed the triage vital signs and the nursing notes.   HISTORY  Chief Complaint Back Pain and Flank Pain      HPI Lori Lang is a 45 y.o. female with history of one previous kidney stone presents with acute onset of 10 out of 10 left flank/groin pain approximately 2 hours before presentation. Patient denies any nausea no vomiting or diarrhea. Patient denies any fever or chills. Patient denies any urinary symptoms urinary frequency urgency immature area or dysuria.    Past Medical History  Diagnosis Date  . Pneumonia   . Kidney stones   . GERD (gastroesophageal reflux disease)     There are no active problems to display for this patient.   Past Surgical History  Procedure Laterality Date  . Extracorporeal shock wave lithotripsy Right 06/07/2015    Procedure: EXTRACORPOREAL SHOCK WAVE LITHOTRIPSY (ESWL);  Surgeon: Royston Cowper, MD;  Location: [facility] ORS;  Service: Urology;  Laterality: Right;  . Shoulder open rotator cuff repair Bilateral 2007 2008  . Wisdom tooth extraction    . Abdominal hysterectomy    . Radiology with anesthesia N/A 07/19/2015    Procedure: MRI LUMBER SPIN WITHOUT;  Surgeon: Medication Radiologist, MD;  Location: [facility];  Service: Radiology;  Laterality: N/A;    Current Outpatient Rx  Name  Route  Sig  Dispense  Refill  . diazepam (VALIUM) 5 MG tablet   Oral   Take 5 mg by mouth every 6 (six) hours as needed for muscle spasms.         Marland Kitchen oxyCODONE-acetaminophen (PERCOCET) 7.5-325 MG tablet   Oral   Take 1 tablet by mouth every 4 (four) hours as needed for severe pain.         Marland Kitchen gabapentin (NEURONTIN) 300 MG capsule   Oral   Take 300 mg by mouth at bedtime.            Allergies No known drug allergies History reviewed. No pertinent family history.  Social History Social History   Substance Use Topics  . Smoking status: Current Every Day Smoker -- 0.50 packs/day for 20 years    Types: Cigarettes  . Smokeless tobacco: Never Used  . Alcohol Use: No    Review of Systems  Constitutional: Negative for fever. Eyes: Negative for visual changes. ENT: Negative for sore throat. Cardiovascular: Negative for chest pain. Respiratory: Negative for shortness of breath. Gastrointestinal: Positive for left flank/complaint. Genitourinary: Negative for dysuria. Musculoskeletal: Negative for back pain. Skin: Negative for rash. Neurological: Negative for headaches, focal weakness or numbness.  10-point ROS otherwise negative.  ____________________________________________   PHYSICAL EXAM:  VITAL SIGNS: ED Triage Vitals  Enc Vitals Group     BP 10/06/15 2010 136/86 mmHg     Pulse Rate 10/06/15 2010 80     Resp 10/06/15 2010 24     Temp 10/06/15 2010 97.9 F (36.6 C)     Temp Source 10/06/15 2010 Oral     SpO2 10/06/15 2010 100 %     Weight 10/06/15 2010 192 lb (87.091 kg)     Height 10/06/15 2010 5\' 5"  (1.651 m)     Head Cir --      Peak Flow --      Pain Score 10/06/15 2010 10     Pain Loc --      Pain Edu? --      Excl.  in [place]? --     Constitutional: Alert and oriented. Well appearing and in no distress. Eyes: Conjunctivae are normal. PERRL. Normal extraocular movements. ENT   Head: Normocephalic and atraumatic.   Nose: No congestion/rhinnorhea.   Mouth/Throat: Mucous membranes are moist.   Neck: No stridor. Hematological/Lymphatic/Immunilogical: No cervical lymphadenopathy. Cardiovascular: Normal rate, regular rhythm. Normal and symmetric distal pulses are present in all extremities. No murmurs, rubs, or gallops. Respiratory: Normal respiratory effort without tachypnea nor retractions. Breath sounds are clear and equal bilaterally. No wheezes/rales/rhonchi. Gastrointestinal: Soft and nontender. No distention. There is no CVA  tenderness. Genitourinary: deferred Musculoskeletal: Nontender with normal range of motion in all extremities. No joint effusions.  No lower extremity tenderness nor edema. Neurologic:  Normal speech and language. No gross focal neurologic deficits are appreciated. Speech is normal.  Skin:  Skin is warm, dry and intact. No rash noted. Psychiatric: Mood and affect are normal. Speech and behavior are normal. Patient exhibits appropriate insight and judgment.  ____________________________________________    LABS (pertinent positives/negatives)  Labs Reviewed  URINALYSIS COMPLETEWITH MICROSCOPIC ([facility] ONLY) - Abnormal; Notable for the following:    Color, Urine YELLOW (*)    APPearance HAZY (*)    Hgb urine dipstick 3+ (*)    Protein, ur 30 (*)    Bacteria, UA RARE (*)    Squamous Epithelial / LPF 0-5 (*)    All other components within normal limits  BASIC METABOLIC PANEL - Abnormal; Notable for the following:    CO2 21 (*)    Glucose, Bld 127 (*)    All other components within normal limits  CBC       RADIOLOGY  CT Renal Stone Study (Final result) Result time: 10/06/15 22:15:07   Final result by Rad Results In Interface (10/06/15 22:15:07)   Narrative:   CLINICAL DATA: Sudden onset left lower back pain radiating to the left groin at 6 p.m. Chills. Previous history of renal stones. Back surgery on 06/27.  EXAM: CT ABDOMEN AND PELVIS WITHOUT CONTRAST  TECHNIQUE: Multidetector CT imaging of the abdomen and pelvis was performed following the standard protocol without IV contrast.  COMPARISON: 09/01/2006  FINDINGS: The lung bases are clear.  Mild left hydronephrosis without hydroureter. Stranding around the left kidney in ureter. There is a stone in the bladder which likely represents or recently passed stone from the left ureter. No additional left renal or ureteral stones are demonstrated. There is a nonobstructing stone in the midpole of the right  kidney.  Heterogeneous appearance of the dome of the liver demonstrating a poorly defined mass on noncontrast imaging. This corresponds to the lesion seen on the previous contrast-enhanced study and likely representing hemangioma. The unenhanced appearance of the gallbladder, pancreas, spleen, adrenal glands, inferior vena cava, and retroperitoneal lymph nodes is unremarkable. Mild scattered calcification of the aorta. The stomach, small bowel, and colon are not abnormally distended. No free air or free fluid in the abdomen.  Pelvis: The appendix is normal. Surgical absence of the uterus. No pelvic mass or lymphadenopathy. No free or loculated pelvic fluid collections. Postoperative changes in the lumbar spine with left hemi laminectomy at L5 and associated infiltration and fluid collection in the posterior subcutaneous fat. This likely represents postoperative change with a residual postoperative fluid collection. Infection cannot be entirely excluded.  IMPRESSION: Stone in the bladder likely representing recently passed stone from the left ureter. There is some residual left hydronephrosis and stranding around the left kidney. Nonobstructing intrarenal stone in the right  kidney. Lesion in the dome of the liver is poorly delineated on noncontrast imaging but was present on a previous study from 2008, likely representing hemangioma. Postoperative changes in the lumbar spine and soft tissues.   Electronically Signed By: Lucienne Capers M.D. On: 10/06/2015 22:15         Procedures     INITIAL IMPRESSION / ASSESSMENT AND PLAN / ED COURSE  Pertinent labs & imaging results that were available during my care of the patient were reviewed by me and considered in my medical decision making (see chart for details).  ----------------------------------------- 12:38 AM on 10/07/2015 -----------------------------------------  Pain currently 3 out of 10 status post fentanyl  morphine and Toradol. CT scan revealed evidence of a recently passed left ureteral stone.  ____________________________________________   FINAL CLINICAL IMPRESSION(S) / ED DIAGNOSES  Final diagnoses:  Kidney stone on left side      Gregor Hams, MD 10/07/15 [phone]

## 2015-10-07 NOTE — Discharge Instructions (Signed)
Kidney Stones °Kidney stones (urolithiasis) are deposits that form inside your kidneys. The intense pain is caused by the stone moving through the urinary tract. When the stone moves, the ureter goes into spasm around the stone. The stone is usually passed in the urine.  °CAUSES  °· A disorder that makes certain neck glands produce too much parathyroid hormone (primary hyperparathyroidism). °· A buildup of uric acid crystals, similar to gout in your joints. °· Narrowing (stricture) of the ureter. °· A kidney obstruction present at birth (congenital obstruction). °· Previous surgery on the kidney or ureters. °· Numerous kidney infections. °SYMPTOMS  °· Feeling sick to your stomach (nauseous). °· Throwing up (vomiting). °· Blood in the urine (hematuria). °· Pain that usually spreads (radiates) to the groin. °· Frequency or urgency of urination. °DIAGNOSIS  °· Taking a history and physical exam. °· Blood or urine tests. °· CT scan. °· Occasionally, an examination of the inside of the urinary bladder (cystoscopy) is performed. °TREATMENT  °· Observation. °· Increasing your fluid intake. °· Extracorporeal shock wave lithotripsy--This is a noninvasive procedure that uses shock waves to break up kidney stones. °· Surgery may be needed if you have severe pain or persistent obstruction. There are various surgical procedures. Most of the procedures are performed with the use of small instruments. Only small incisions are needed to accommodate these instruments, so recovery time is minimized. °The size, location, and chemical composition are all important variables that will determine the proper choice of action for you. Talk to your health care provider to better understand your situation so that you will minimize the risk of injury to yourself and your kidney.  °HOME CARE INSTRUCTIONS  °· Drink enough water and fluids to keep your urine clear or pale yellow. This will help you to pass the stone or stone fragments. °· Strain  all urine through the provided strainer. Keep all particulate matter and stones for your health care provider to see. The stone causing the pain may be as small as a grain of salt. It is very important to use the strainer each and every time you pass your urine. The collection of your stone will allow your health care provider to analyze it and verify that a stone has actually passed. The stone analysis will often identify what you can do to reduce the incidence of recurrences. °· Only take over-the-counter or prescription medicines for pain, discomfort, or fever as directed by your health care provider. °· Keep all follow-up visits as told by your health care provider. This is important. °· Get follow-up X-rays if required. The absence of pain does not always mean that the stone has passed. It may have only stopped moving. If the urine remains completely obstructed, it can cause loss of kidney function or even complete destruction of the kidney. It is your responsibility to make sure X-rays and follow-ups are completed. Ultrasounds of the kidney can show blockages and the status of the kidney. Ultrasounds are not associated with any radiation and can be performed easily in a matter of minutes. °· Make changes to your daily diet as told by your health care provider. You may be told to: °¨ Limit the amount of salt that you eat. °¨ Eat 5 or more servings of fruits and vegetables each day. °¨ Limit the amount of meat, poultry, fish, and eggs that you eat. °· Collect a 24-hour urine sample as told by your health care provider. You may need to collect another urine sample every 6-12   months. °SEEK MEDICAL CARE IF: °· You experience pain that is progressive and unresponsive to any pain medicine you have been prescribed. °SEEK IMMEDIATE MEDICAL CARE IF:  °· Pain cannot be controlled with the prescribed medicine. °· You have a fever or shaking chills. °· The severity or intensity of pain increases over 18 hours and is not  relieved by pain medicine. °· You develop a new onset of abdominal pain. °· You feel faint or pass out. °· You are unable to urinate. °  °This information is not intended to replace advice given to you by your health care provider. Make sure you discuss any questions you have with your health care provider. °  °Document Released: 03/10/2005 Document Revised: 11/29/2014 Document Reviewed: 08/11/2012 °Elsevier Interactive Patient Education ©2016 [employer] ° °

## 2016-01-16 ENCOUNTER — Other Ambulatory Visit: Payer: Self-pay | Admitting: Orthopedic Surgery

## 2016-02-08 ENCOUNTER — Encounter (HOSPITAL_BASED_OUTPATIENT_CLINIC_OR_DEPARTMENT_OTHER): Payer: Self-pay | Admitting: *Deleted

## 2016-02-14 NOTE — Discharge Instructions (Addendum)
Discharge Instructions   You have a light dressing on your hand.  You may begin gentle motion of your fingers and hand immediately, but you should not do any heavy lifting or gripping.  Elevate your hand above your elbow to reduce pain & swelling of the digits.  Ice over the operative site may be helpful to reduce pain & swelling.  DO NOT USE HEAT. Pain medicine has been prescribed for you.  Use your medicine for severe pain over the first 48 hours, and then you can begin to taper your use.  Take 800 mg of ibuprofen over the counter 3x per day and Tylenol as it states on the bottle. Leave the dressing in place until the third day after your surgery and then remove it, leaving it open to air.  After the bandage has been removed you may shower, regularly washing the incision and letting the water run over it, but not submerging it (no swimming, soaking it in dishwater, etc.) You may drive a car when you are off of prescription pain medications and can safely control your vehicle with both hands. We will address whether therapy will be required or not when you return to the office. You may have already made your follow-up appointment when we completed your preop visit.  If not, please call our office today or the next business day to make your return appointment for 10-15 days after surgery.   Please call [phone] during normal business hours or [phone] after hours for any problems. Including the following:  - excessive redness of the incisions - drainage for more than 4 days - fever of more than 101.5 F  *Please note that pain medications will not be refilled after hours or on weekends.  Post Anesthesia Home Care Instructions  Activity: Get plenty of rest for the remainder of the day. A responsible adult should stay with you for 24 hours following the procedure.  For the next 24 hours, DO NOT: -Drive a car -Paediatric nurse -Drink alcoholic beverages -Take any medication unless  instructed by your physician -Make any legal decisions or sign important papers.  Meals: Start with liquid foods such as gelatin or soup. Progress to regular foods as tolerated. Avoid greasy, spicy, heavy foods. If nausea and/or vomiting occur, drink only clear liquids until the nausea and/or vomiting subsides. Call your physician if vomiting continues.  Special Instructions/Symptoms: Your throat may feel dry or sore from the anesthesia or the breathing tube placed in your throat during surgery. If this causes discomfort, gargle with warm salt water. The discomfort should disappear within 24 hours.  If you had a scopolamine patch placed behind your ear for the management of post- operative nausea and/or vomiting:  1. The medication in the patch is effective for 72 hours, after which it should be removed.  Wrap patch in a tissue and discard in the trash. Wash hands thoroughly with soap and water. 2. You may remove the patch earlier than 72 hours if you experience unpleasant side effects which may include dry mouth, dizziness or visual disturbances. 3. Avoid touching the patch. Wash your hands with soap and water after contact with the patch.

## 2016-02-14 NOTE — Op Note (Signed)
02/18/2016  9:07 AM  PATIENT:  Lori Lang  46 y.o. female  PRE-OPERATIVE DIAGNOSIS:  Left trigger thumb  POST-OPERATIVE DIAGNOSIS:  Same  PROCEDURE:  Left trigger thumb release  SURGEON: Rayvon Char. Grandville Silos, MD  PHYSICIAN ASSISTANT: Morley Kos, OPA-C  ANESTHESIA:  local and MAC  SPECIMENS:  None   DRAINS:   None  EBL:  less than 50 mL  PREOPERATIVE INDICATIONS:  [name] is a  45 y.o. female with left trigger thumb that persists despite non-operative measures  The risks benefits and alternatives were discussed with the patient preoperatively including but not limited to the risks of infection, bleeding, nerve injury, cardiopulmonary complications, the need for revision surgery, among others, and the patient verbalized understanding and consented to proceed.  OPERATIVE IMPLANTS: none  OPERATIVE PROCEDURE:  After receiving prophylactic antibiotics, the patient was escorted to the operative theatre and placed in a supine position.  The surgical site was anesthetized with a mixture of lidocaine and Marcaine. A surgical "time-out" was performed during which the planned procedure, proposed operative site, and the correct patient identity were compared to the operative consent and agreement confirmed by the circulating nurse according to current facility policy.  Following application of a tourniquet to the operative extremity, the exposed skin was prepped with Chloraprep and draped in the usual sterile fashion.  The limb was exsanguinated with an Esmarch bandage and the tourniquet inflated to approximately 127mmHg higher than systolic BP.  Transverse incision was made at the base of the thumb volarly. Care was taken to protect and preserve the neurovascular bundles. Spreading dissection was carried down to the level of the flexor tendon sheath. The A1 pulley was incised, with care to preserve the oblique pulley. There was some thickening with a small retinacular cyst in the  wall of the A1 pulley which was excised. The tendon was found to be with a longitudinal split tear, and this was tubularized again with a 4-0 Vicryl Rapide interrupted suture. Tourniquet was released, the wound irrigated, and the skin closed with 4-0 Vicryl Rapide interrupted sutures. A light dressing was applied and she was taken to the recovery room in stable condition.  DISPOSITION: She'll be discharged home today with typical postop instructions, returning 10:15 days.

## 2016-02-14 NOTE — H&P (Signed)
Lori Lang is an 45 y.o. female.   CC / Reason for Visit: Left trigger thumb HPI: This patient is a 45 year old, right-hand-dominant, female who arrives to clinic today indicating that she would like her left trigger thumb treated.  She states that she had an injection back in March of this year which was helpful for approximately 3 months, but the trigger thumb has since come back and is very painful.  It pops and catches per the patient's report in the morning and at times becomes stuck.   Past Medical History:  Diagnosis Date  . GERD (gastroesophageal reflux disease)   . Kidney stones   . Pneumonia   . Trigger finger of left thumb     Past Surgical History:  Procedure Laterality Date  . ABDOMINAL HYSTERECTOMY    . BACK SURGERY  08/2015  . EXTRACORPOREAL SHOCK WAVE LITHOTRIPSY Right 06/07/2015   Procedure: EXTRACORPOREAL SHOCK WAVE LITHOTRIPSY (ESWL);  Surgeon: Royston Cowper, MD;  Location: [facility];  Service: Urology;  Laterality: Right;  . RADIOLOGY WITH ANESTHESIA N/A 07/19/2015   Procedure: MRI LUMBER SPIN WITHOUT;  Surgeon: Medication Radiologist, MD;  Location: [place];  Service: Radiology;  Laterality: N/A;  . SHOULDER OPEN ROTATOR CUFF REPAIR Bilateral 2007 2008  . WISDOM TOOTH EXTRACTION      History reviewed. No pertinent family history. Social History:  reports that she has been smoking Cigarettes.  She has a 10.00 pack-year smoking history. She has never used smokeless tobacco. She reports that she does not drink alcohol or use drugs.  Allergies: No Known Allergies  No prescriptions prior to admission.    No results found for this or any previous visit (from the past 48 hour(s)). No results found.  Review of Systems  Eyes: Positive for redness.  All other systems reviewed and are negative.   Height 5\' 5"  (1.651 m), weight 88.5 kg (195 lb). Physical Exam  Constitutional:  WD, WN, NAD HEENT:  NCAT, EOMI Neuro/Psych:  Alert & oriented to person, place, and  time; appropriate mood & affect Lymphatic: No generalized UE edema or lymphadenopathy Extremities / MSK:  Both UE are normal with respect to appearance, ranges of motion, joint stability, muscle strength/tone, sensation, & perfusion except as otherwise noted:  The left thumb appears normal.  There is pain with flexion of the IP especially over the A1 pulley.  Palpation exacerbates this and causes  locking and catching.  NVI.  Labs / X-rays:  No radiographic studies obtained today.  Assessment: Left thumb trigger finger  Plan:  The findings are discussed with the patient.  She wishes to proceed with left thumb trigger finger release. The details of the operative procedure were discussed with the patient.  Questions were invited and answered.  In addition to the goal of the procedure, the risks of the procedure to include but not limited to bleeding; infection; damage to the nerves or blood vessels that could result in bleeding, numbness, weakness, chronic pain, and the need for additional procedures; stiffness; the need for revision surgery; and anesthetic risks were reviewed.  No specific outcome was guaranteed or implied.    Wrigley Winborne A., MD 02/14/2016, 9:05 AM

## 2016-02-18 ENCOUNTER — Encounter (HOSPITAL_BASED_OUTPATIENT_CLINIC_OR_DEPARTMENT_OTHER): Payer: Self-pay | Admitting: Certified Registered"

## 2016-02-18 ENCOUNTER — Ambulatory Visit (HOSPITAL_BASED_OUTPATIENT_CLINIC_OR_DEPARTMENT_OTHER)
Admission: RE | Admit: 2016-02-18 | Discharge: 2016-02-18 | Disposition: A | Discharge status: Home / Self Care | Payer: BLUE CROSS/BLUE SHIELD | Source: Ambulatory Visit | Attending: Orthopedic Surgery | Admitting: Orthopedic Surgery

## 2016-02-18 ENCOUNTER — Encounter (HOSPITAL_BASED_OUTPATIENT_CLINIC_OR_DEPARTMENT_OTHER)
Admission: RE | Disposition: A | Discharge status: Home / Self Care | Payer: Self-pay | Source: Ambulatory Visit | Attending: Orthopedic Surgery

## 2016-02-18 ENCOUNTER — Ambulatory Visit (HOSPITAL_BASED_OUTPATIENT_CLINIC_OR_DEPARTMENT_OTHER): Payer: BLUE CROSS/BLUE SHIELD | Admitting: Certified Registered"

## 2016-02-18 DIAGNOSIS — F17210 Nicotine dependence, cigarettes, uncomplicated: Secondary | ICD-10-CM | POA: Diagnosis not present

## 2016-02-18 DIAGNOSIS — Z79899 Other long term (current) drug therapy: Secondary | ICD-10-CM | POA: Insufficient documentation

## 2016-02-18 DIAGNOSIS — M65312 Trigger thumb, left thumb: Secondary | ICD-10-CM | POA: Insufficient documentation

## 2016-02-18 HISTORY — DX: Trigger thumb, left thumb: M65.312

## 2016-02-18 HISTORY — PX: TRIGGER FINGER RELEASE: SHX641

## 2016-02-18 SURGERY — RELEASE, A1 PULLEY, FOR TRIGGER FINGER
Anesthesia: Monitor Anesthesia Care | Site: Thumb | Laterality: Left

## 2016-02-18 MED ORDER — PROPOFOL 500 MG/50ML IV EMUL
INTRAVENOUS | Status: DC | PRN
  Administered 2016-02-18: 50 ug/kg/min via INTRAVENOUS

## 2016-02-18 MED ORDER — EPHEDRINE 5 MG/ML INJ
INTRAVENOUS | Status: AC
  Filled 2016-02-18: qty 10

## 2016-02-18 MED ORDER — OXYCODONE HCL 5 MG PO TABS
ORAL_TABLET | ORAL | Status: AC
  Filled 2016-02-18: qty 1

## 2016-02-18 MED ORDER — FENTANYL CITRATE (PF) 100 MCG/2ML IJ SOLN
50.0000 ug | INTRAMUSCULAR | Status: DC | PRN
  Administered 2016-02-18: 100 ug via INTRAVENOUS

## 2016-02-18 MED ORDER — CEFAZOLIN SODIUM-DEXTROSE 2-4 GM/100ML-% IV SOLN: 2.0000 g | INTRAVENOUS | Status: DC

## 2016-02-18 MED ORDER — FENTANYL CITRATE (PF) 100 MCG/2ML IJ SOLN
INTRAMUSCULAR | Status: AC
Start: 2016-02-18 — End: 2016-02-18
  Filled 2016-02-18: qty 2

## 2016-02-18 MED ORDER — LACTATED RINGERS IV SOLN
INTRAVENOUS | Status: DC
  Administered 2016-02-18: 11:00:00 via INTRAVENOUS

## 2016-02-18 MED ORDER — ONDANSETRON HCL 4 MG/2ML IJ SOLN
INTRAMUSCULAR | Status: DC | PRN
  Administered 2016-02-18: 4 mg via INTRAVENOUS

## 2016-02-18 MED ORDER — PROPOFOL 10 MG/ML IV BOLUS
INTRAVENOUS | Status: AC
  Filled 2016-02-18: qty 20

## 2016-02-18 MED ORDER — LIDOCAINE HCL 2 % IJ SOLN
INTRAMUSCULAR | Status: AC
  Filled 2016-02-18: qty 20

## 2016-02-18 MED ORDER — MIDAZOLAM HCL 2 MG/2ML IJ SOLN
INTRAMUSCULAR | Status: AC
  Filled 2016-02-18: qty 2

## 2016-02-18 MED ORDER — OXYCODONE HCL 5 MG PO TABS
5.0000 mg | ORAL_TABLET | Freq: Once | ORAL | Status: AC
  Administered 2016-02-18: 5 mg via ORAL

## 2016-02-18 MED ORDER — MIDAZOLAM HCL 2 MG/2ML IJ SOLN
1.0000 mg | INTRAMUSCULAR | Status: DC | PRN
  Administered 2016-02-18: 2 mg via INTRAVENOUS

## 2016-02-18 MED ORDER — SCOPOLAMINE 1 MG/3DAYS TD PT72
1.0000 | MEDICATED_PATCH | Freq: Once | TRANSDERMAL | Status: DC | PRN
Start: 2016-02-18 — End: 2016-02-18

## 2016-02-18 MED ORDER — FENTANYL CITRATE (PF) 100 MCG/2ML IJ SOLN: 25.0000 ug | INTRAMUSCULAR | Status: DC | PRN

## 2016-02-18 MED ORDER — OXYCODONE HCL 5 MG PO TABS: 5.0000 mg | ORAL_TABLET | Freq: Four times a day (QID) | ORAL | 0 refills | Status: DC | PRN

## 2016-02-18 MED ORDER — LIDOCAINE HCL 2 % IJ SOLN
INTRAMUSCULAR | Status: DC | PRN
  Administered 2016-02-18: 1.5 mL

## 2016-02-18 MED ORDER — BUPIVACAINE HCL (PF) 0.5 % IJ SOLN
INTRAMUSCULAR | Status: DC | PRN
  Administered 2016-02-18: 1.5 mL

## 2016-02-18 MED ORDER — CEFAZOLIN SODIUM-DEXTROSE 2-4 GM/100ML-% IV SOLN
INTRAVENOUS | Status: AC
  Filled 2016-02-18: qty 100

## 2016-02-18 MED ORDER — BUPIVACAINE HCL (PF) 0.5 % IJ SOLN
INTRAMUSCULAR | Status: AC
  Filled 2016-02-18: qty 60

## 2016-02-18 MED ORDER — LACTATED RINGERS IV SOLN: INTRAVENOUS | Status: DC

## 2016-02-18 SURGICAL SUPPLY — 40 items
BLADE SURG 15 STRL LF DISP TIS (BLADE) ×1 IMPLANT
BLADE SURG 15 STRL SS (BLADE) ×1
BNDG COHESIVE 1X5 TAN STRL LF (GAUZE/BANDAGES/DRESSINGS) ×2 IMPLANT
BNDG CONFORM 2 STRL LF (GAUZE/BANDAGES/DRESSINGS) ×2 IMPLANT
BNDG ESMARK 4X9 LF (GAUZE/BANDAGES/DRESSINGS) ×2 IMPLANT
BNDG GAUZE ELAST 4 BULKY (GAUZE/BANDAGES/DRESSINGS) ×2 IMPLANT
CHLORAPREP W/TINT 26ML (MISCELLANEOUS) ×2 IMPLANT
COVER BACK TABLE 60X90IN (DRAPES) ×2 IMPLANT
COVER MAYO STAND STRL (DRAPES) ×2 IMPLANT
CUFF TOURNIQUET SINGLE 18IN (TOURNIQUET CUFF) ×2 IMPLANT
DRAPE EXTREMITY T 121X128X90 (DRAPE) ×2 IMPLANT
DRAPE SURG 17X23 STRL (DRAPES) ×2 IMPLANT
DRSG EMULSION OIL 3X3 NADH (GAUZE/BANDAGES/DRESSINGS) ×2 IMPLANT
GLOVE BIO SURGEON STRL SZ7.5 (GLOVE) ×2 IMPLANT
GLOVE BIOGEL PI IND STRL 7.0 (GLOVE) ×1 IMPLANT
GLOVE BIOGEL PI IND STRL 7.5 (GLOVE) ×1 IMPLANT
GLOVE BIOGEL PI IND STRL 8 (GLOVE) ×1 IMPLANT
GLOVE BIOGEL PI INDICATOR 7.0 (GLOVE) ×1
GLOVE BIOGEL PI INDICATOR 7.5 (GLOVE) ×1
GLOVE BIOGEL PI INDICATOR 8 (GLOVE) ×1
GLOVE ECLIPSE 6.5 STRL STRAW (GLOVE) ×2 IMPLANT
GLOVE SURG SYN 7.5  E (GLOVE) ×1
GLOVE SURG SYN 7.5 E (GLOVE) ×1 IMPLANT
GOWN STRL REUS W/ TWL LRG LVL3 (GOWN DISPOSABLE) ×2 IMPLANT
GOWN STRL REUS W/TWL LRG LVL3 (GOWN DISPOSABLE) ×2
GOWN STRL REUS W/TWL XL LVL3 (GOWN DISPOSABLE) ×2 IMPLANT
NDL SAFETY ECLIPSE 18X1.5 (NEEDLE) ×1 IMPLANT
NEEDLE HYPO 18GX1.5 SHARP (NEEDLE) ×1
NEEDLE HYPO 25X1 1.5 SAFETY (NEEDLE) ×2 IMPLANT
NS IRRIG 1000ML POUR BTL (IV SOLUTION) ×2 IMPLANT
PACK BASIN DAY SURGERY FS (CUSTOM PROCEDURE TRAY) ×2 IMPLANT
SPONGE GAUZE 4X4 12PLY STER LF (GAUZE/BANDAGES/DRESSINGS) ×2 IMPLANT
STOCKINETTE 6  STRL (DRAPES) ×1
STOCKINETTE 6 STRL (DRAPES) ×1 IMPLANT
SUT VICRYL RAPIDE 4/0 PS 2 (SUTURE) ×2 IMPLANT
SYR 10ML LL (SYRINGE) ×2 IMPLANT
SYR BULB 3OZ (MISCELLANEOUS) ×2 IMPLANT
TOWEL OR 17X24 6PK STRL BLUE (TOWEL DISPOSABLE) ×2 IMPLANT
TOWEL OR NON WOVEN STRL DISP B (DISPOSABLE) IMPLANT
UNDERPAD 30X30 (UNDERPADS AND DIAPERS) ×2 IMPLANT

## 2016-02-18 NOTE — Transfer of Care (Signed)
Immediate Anesthesia Transfer of Care Note  Patient: Lori Lang  Procedure(s) Performed: Procedure(s): LEFT TRIGGER THUMB RELEASE (Left)  Patient Location: PACU  Anesthesia Type:MAC  Level of Consciousness: awake, alert , oriented and patient cooperative  Airway & Oxygen Therapy: Patient Spontanous Breathing and Patient connected to face mask oxygen  Post-op Assessment: Report given to RN and Post -op Vital signs reviewed and stable  Post vital signs: Reviewed and stable  Last Vitals:  Vitals:   02/18/16 0944  BP: 126/64  Pulse: 75  Resp: 18  Temp: 36.6 C    Last Pain:  Vitals:   02/18/16 0952  TempSrc:   PainSc: 3       Patients Stated Pain Goal: 1 ([id number])  Complications: No apparent anesthesia complications

## 2016-02-18 NOTE — Interval H&P Note (Signed)
History and Physical Interval Note:  02/18/2016 11:31 AM  Lori Lang  has presented today for surgery, with the diagnosis of LEFT TRIGGER THUMB M65.312  The various methods of treatment have been discussed with the patient and family. After consideration of risks, benefits and other options for treatment, the patient has consented to  Procedure(s): LEFT TRIGGER THUMB RELEASE (Left) as a surgical intervention .  The patient's history has been reviewed, patient examined, no change in status, stable for surgery.  I have reviewed the patient's chart and labs.  Questions were answered to the patient's satisfaction.     Ken Bonn A.

## 2016-02-18 NOTE — Anesthesia Preprocedure Evaluation (Addendum)
Anesthesia Evaluation  Patient identified by MRN, date of birth, ID band Patient awake    Reviewed: Allergy & Precautions, H&P , NPO status , Patient's Chart, lab work & pertinent test results  Airway Mallampati: II  TM Distance: >3 FB Neck ROM: Full    Dental no notable dental hx. (+) Teeth Intact, Dental Advisory Given   Pulmonary Current Smoker,    Pulmonary exam normal breath sounds clear to auscultation       Cardiovascular negative cardio ROS   Rhythm:Regular Rate:Normal     Neuro/Psych negative neurological ROS  negative psych ROS   GI/Hepatic Neg liver ROS, GERD  Medicated and Controlled,  Endo/Other  negative endocrine ROS  Renal/GU Renal disease  negative genitourinary   Musculoskeletal   Abdominal   Peds  Hematology negative hematology ROS (+)   Anesthesia Other Findings   Reproductive/Obstetrics negative OB ROS                           Anesthesia Physical Anesthesia Plan  ASA: II  Anesthesia Plan: MAC   Post-op Pain Management:    Induction: Intravenous  Airway Management Planned: Simple Face Mask  Additional Equipment:   Intra-op Plan:   Post-operative Plan:   Informed Consent: I have reviewed the patients History and Physical, chart, labs and discussed the procedure including the risks, benefits and alternatives for the proposed anesthesia with the patient or authorized representative who has indicated his/her understanding and acceptance.   Dental advisory given  Plan Discussed with: CRNA  Anesthesia Plan Comments:         Anesthesia Quick Evaluation

## 2016-02-18 NOTE — Anesthesia Procedure Notes (Signed)
Procedure Name: MAC Date/Time: 02/18/2016 11:45 AM Performed by: Eilidh Marcano D Pre-anesthesia Checklist: Patient identified, Emergency Drugs available, Suction available, Patient being monitored and Timeout performed Patient Re-evaluated:Patient Re-evaluated prior to inductionOxygen Delivery Method: Simple face mask

## 2016-02-18 NOTE — Anesthesia Postprocedure Evaluation (Signed)
Anesthesia Post Note  Patient: Lori Lang  Procedure(s) Performed: Procedure(s) (LRB): LEFT TRIGGER THUMB RELEASE (Left)  Patient location during evaluation: PACU Anesthesia Type: MAC Level of consciousness: awake and alert Pain management: pain level controlled Vital Signs Assessment: post-procedure vital signs reviewed and stable Respiratory status: spontaneous breathing, nonlabored ventilation and respiratory function stable Cardiovascular status: stable and blood pressure returned to baseline Anesthetic complications: no    Last Vitals:  Vitals:   02/18/16 1230 02/18/16 1245  BP: 119/81 (!) 122/55  Pulse: 74 71  Resp: 17 18  Temp:  36.7 C    Last Pain:  Vitals:   02/18/16 1245  TempSrc:   PainSc: 3                  Abdelrahman Nair,W. EDMOND

## 2016-02-19 ENCOUNTER — Encounter (HOSPITAL_BASED_OUTPATIENT_CLINIC_OR_DEPARTMENT_OTHER): Payer: Self-pay | Admitting: Orthopedic Surgery

## 2016-08-15 DIAGNOSIS — E669 Obesity, unspecified: Secondary | ICD-10-CM | POA: Diagnosis not present

## 2016-08-15 DIAGNOSIS — E069 Thyroiditis, unspecified: Secondary | ICD-10-CM | POA: Diagnosis not present

## 2016-08-15 DIAGNOSIS — E049 Nontoxic goiter, unspecified: Secondary | ICD-10-CM | POA: Diagnosis not present

## 2016-08-19 ENCOUNTER — Other Ambulatory Visit: Payer: Self-pay | Admitting: Internal Medicine

## 2016-08-19 DIAGNOSIS — E049 Nontoxic goiter, unspecified: Secondary | ICD-10-CM

## 2016-08-25 ENCOUNTER — Encounter: Payer: Self-pay | Admitting: Internal Medicine

## 2016-08-25 DIAGNOSIS — D751 Secondary polycythemia: Secondary | ICD-10-CM | POA: Diagnosis not present

## 2016-08-25 DIAGNOSIS — E049 Nontoxic goiter, unspecified: Secondary | ICD-10-CM | POA: Diagnosis not present

## 2016-08-26 ENCOUNTER — Ambulatory Visit
Admission: RE | Admit: 2016-08-26 | Discharge: 2016-08-26 | Disposition: A | Discharge status: Home / Self Care | Payer: BLUE CROSS/BLUE SHIELD | Source: Ambulatory Visit | Attending: Internal Medicine | Admitting: Internal Medicine

## 2016-08-26 DIAGNOSIS — E049 Nontoxic goiter, unspecified: Secondary | ICD-10-CM | POA: Diagnosis not present

## 2016-08-26 DIAGNOSIS — E042 Nontoxic multinodular goiter: Secondary | ICD-10-CM | POA: Diagnosis not present

## 2017-02-12 ENCOUNTER — Other Ambulatory Visit: Payer: Self-pay

## 2017-02-12 ENCOUNTER — Encounter: Payer: Self-pay | Admitting: Emergency Medicine

## 2017-02-12 ENCOUNTER — Emergency Department
Admission: EM | Admit: 2017-02-12 | Discharge: 2017-02-12 | Disposition: A | Discharge status: Home / Self Care | Payer: BLUE CROSS/BLUE SHIELD | Attending: Emergency Medicine | Admitting: Emergency Medicine

## 2017-02-12 DIAGNOSIS — F17210 Nicotine dependence, cigarettes, uncomplicated: Secondary | ICD-10-CM | POA: Insufficient documentation

## 2017-02-12 DIAGNOSIS — J029 Acute pharyngitis, unspecified: Secondary | ICD-10-CM | POA: Diagnosis not present

## 2017-02-12 DIAGNOSIS — Z79899 Other long term (current) drug therapy: Secondary | ICD-10-CM | POA: Diagnosis not present

## 2017-02-12 DIAGNOSIS — H6691 Otitis media, unspecified, right ear: Secondary | ICD-10-CM | POA: Diagnosis not present

## 2017-02-12 DIAGNOSIS — H6690 Otitis media, unspecified, unspecified ear: Secondary | ICD-10-CM

## 2017-02-12 DIAGNOSIS — R0981 Nasal congestion: Secondary | ICD-10-CM | POA: Diagnosis not present

## 2017-02-12 LAB — POCT RAPID STREP A: Streptococcus, Group A Screen (Direct): NEGATIVE

## 2017-02-12 MED ORDER — AMOXICILLIN-POT CLAVULANATE 875-125 MG PO TABS: 1.0000 | ORAL_TABLET | Freq: Once | ORAL | Status: DC

## 2017-02-12 MED ORDER — AMOXICILLIN-POT CLAVULANATE 875-125 MG PO TABS: 1.0000 | ORAL_TABLET | Freq: Two times a day (BID) | ORAL | 0 refills | Status: AC

## 2017-02-12 MED ORDER — AMOXICILLIN-POT CLAVULANATE 875-125 MG PO TABS
1.0000 | ORAL_TABLET | Freq: Once | ORAL | Status: AC
Start: 2017-02-12 — End: 2017-02-12
  Administered 2017-02-12: 1 via ORAL
  Filled 2017-02-12: qty 1

## 2017-02-12 MED ORDER — IBUPROFEN 800 MG PO TABS
800.0000 mg | ORAL_TABLET | Freq: Once | ORAL | Status: AC
  Administered 2017-02-12: 800 mg via ORAL
  Filled 2017-02-12: qty 1

## 2017-02-12 MED ORDER — IBUPROFEN 800 MG PO TABS: 800.0000 mg | ORAL_TABLET | Freq: Three times a day (TID) | ORAL | 0 refills | Status: DC | PRN

## 2017-02-12 NOTE — ED Triage Notes (Signed)
Patient ambulatory to triage with steady gait, without difficulty or distress noted; pt reports x 2 days having right ear pain, sore throat, congestion

## 2017-02-12 NOTE — ED Provider Notes (Signed)
[facility] Emergency Department Provider Note   First MD Initiated Contact with Patient 02/12/17 [phone]     (approximate)  I have reviewed the triage vital signs and the nursing notes.   HISTORY  Chief Complaint Sore Throat and Otalgia    HPI Lori Lang is a 46 y.o. female with below list of chronic medical conditions presents to the emergency department with 2-day history of right ear pain sore throat nasal congestion.  Patient denies any fever afebrile on presentation temperature 98.5.  Patient does admit to daily tobacco use   Past Medical History:  Diagnosis Date  . GERD (gastroesophageal reflux disease)   . Kidney stones   . Pneumonia   . Trigger finger of left thumb     There are no active problems to display for this patient.   Past Surgical History:  Procedure Laterality Date  . ABDOMINAL HYSTERECTOMY    . BACK SURGERY  08/2015  . EXTRACORPOREAL SHOCK WAVE LITHOTRIPSY Right 06/07/2015   Procedure: EXTRACORPOREAL SHOCK WAVE LITHOTRIPSY (ESWL);  Surgeon: Royston Cowper, MD;  Location: [facility];  Service: Urology;  Laterality: Right;  . RADIOLOGY WITH ANESTHESIA N/A 07/19/2015   Procedure: MRI LUMBER SPIN WITHOUT;  Surgeon: Medication Radiologist, MD;  Location: [place];  Service: Radiology;  Laterality: N/A;  . SHOULDER OPEN ROTATOR CUFF REPAIR Bilateral 2007 2008  . TRIGGER FINGER RELEASE Left 02/18/2016   Procedure: LEFT TRIGGER THUMB RELEASE;  Surgeon: Milly Jakob, MD;  Location: [place];  Service: Orthopedics;  Laterality: Left;  . WISDOM TOOTH EXTRACTION      Prior to Admission medications   Medication Sig Start Date End Date Taking? Authorizing Provider  amoxicillin-clavulanate (AUGMENTIN) 875-125 MG tablet Take 1 tablet by mouth 2 (two) times daily for 10 days. 02/12/17 02/22/17  Gregor Hams, MD  diazepam (VALIUM) 5 MG tablet Take 5 mg by mouth every 6 (six) hours as needed for muscle spasms.    [provider]  gabapentin (NEURONTIN) 300 MG capsule Take 300 mg by mouth at bedtime.     [provider]  ibuprofen (ADVIL,MOTRIN) 800 MG tablet Take 1 tablet (800 mg total) by mouth every 8 (eight) hours as needed. 02/12/17   Gregor Hams, MD  oxyCODONE (ROXICODONE) 5 MG immediate release tablet Take 1 tablet (5 mg total) by mouth every 6 (six) hours as needed for severe pain or breakthrough pain (use tylenol and ibuprofen regularly as well). 02/18/16   Milly Jakob, MD    Allergies No known drug allergies No family history on file.  Social History Social History   Tobacco Use  . Smoking status: Current Every Day Smoker    Packs/day: 0.50    Years: 20.00    Pack years: 10.00    Types: Cigarettes  . Smokeless tobacco: Never Used  Substance Use Topics  . Alcohol use: No  . Drug use: No    Review of Systems Constitutional: No fever/chills Eyes: No visual changes. ENT: No sore throat.  Positive for right ear pain.  Positive for nasal congestion.  Positive for sore throat Cardiovascular: Denies chest pain. Respiratory: Denies shortness of breath. Gastrointestinal: No abdominal pain.  No nausea, no vomiting.  No diarrhea.  No constipation. Genitourinary: Negative for dysuria. Musculoskeletal: Negative for neck pain.  Negative for back pain. Integumentary: Negative for rash. Neurological: Negative for headaches, focal weakness or numbness.   ____________________________________________   PHYSICAL EXAM:  VITAL SIGNS: ED Triage Vitals [02/12/17  0343]  Enc Vitals Group     BP (!) 149/89     Pulse Rate 85     Resp 20     Temp 98.5 F (36.9 C)     Temp Source Oral     SpO2 97 %     Weight 83.9 kg (185 lb)     Height 1.651 m (5\' 5" )     Head Circumference      Peak Flow      Pain Score 10     Pain Loc      Pain Edu?      Excl. in [place]?     Constitutional: Alert and oriented. Well appearing and in no acute distress. Eyes: Conjunctivae are normal.    Head: Atraumatic. Ears:  Healthy appearing ear canals and right TM erythema bulging Mouth/Throat: Mucous membranes are moist.  Pharyngeal erythema Neck: No stridor.   Cardiovascular: Normal rate, regular rhythm. Good peripheral circulation. Grossly normal heart sounds. Respiratory: Normal respiratory effort.  No retractions. Lungs CTAB. Gastrointestinal: Soft and nontender. No distention.  Musculoskeletal: No lower extremity tenderness nor edema. No gross deformities of extremities. Neurologic:  Normal speech and language. No gross focal neurologic deficits are appreciated.  Skin:  Skin is warm, dry and intact. No rash noted.   ____________________________________________   LABS (all labs ordered are listed, but only abnormal results are displayed)  Labs Reviewed  CULTURE, GROUP A STREP [facility])  POCT RAPID STREP A    Procedures   ____________________________________________   INITIAL IMPRESSION / ASSESSMENT AND PLAN / ED COURSE  As part of my medical decision making, I reviewed the following data within the electronic MEDICAL RECORD [id number] year old female present with above-stated history and physical exam consistent with right otitis media and pharyngitis.  Patient given Augmentin in the emergency department will be prescribed the same for home ____________________________________________  FINAL CLINICAL IMPRESSION(S) / ED DIAGNOSES  Final diagnoses:  Pharyngitis, unspecified etiology  Acute otitis media, unspecified otitis media type     MEDICATIONS GIVEN DURING THIS VISIT:  Medications  amoxicillin-clavulanate (AUGMENTIN) 875-125 MG per tablet 1 tablet (1 tablet Oral Given 02/12/17 0431)  ibuprofen (ADVIL,MOTRIN) tablet 800 mg (800 mg Oral Given 02/12/17 0431)     ED Discharge Orders        Ordered    amoxicillin-clavulanate (AUGMENTIN) 875-125 MG tablet  2 times daily     02/12/17 0414    ibuprofen (ADVIL,MOTRIN) 800 MG tablet  Every 8 hours PRN     02/12/17  0414       Note:  This document was prepared using Dragon voice recognition software and may include unintentional dictation errors.    Gregor Hams, MD 02/12/17 [phone]

## 2017-02-14 LAB — CULTURE, GROUP A STREP (THRC)

## 2017-06-15 DIAGNOSIS — M545 Low back pain: Secondary | ICD-10-CM | POA: Diagnosis not present

## 2017-08-25 ENCOUNTER — Ambulatory Visit: Payer: BLUE CROSS/BLUE SHIELD | Admitting: Internal Medicine

## 2017-08-25 ENCOUNTER — Encounter: Payer: Self-pay | Admitting: Internal Medicine

## 2017-08-25 ENCOUNTER — Ambulatory Visit (INDEPENDENT_AMBULATORY_CARE_PROVIDER_SITE_OTHER): Payer: BLUE CROSS/BLUE SHIELD

## 2017-08-25 VITALS — BP 100/60 | HR 88 | Temp 98.6°F | Ht 65.0 in | Wt 197.0 lb

## 2017-08-25 DIAGNOSIS — Z1389 Encounter for screening for other disorder: Secondary | ICD-10-CM

## 2017-08-25 DIAGNOSIS — R062 Wheezing: Secondary | ICD-10-CM | POA: Diagnosis not present

## 2017-08-25 DIAGNOSIS — Z1329 Encounter for screening for other suspected endocrine disorder: Secondary | ICD-10-CM

## 2017-08-25 DIAGNOSIS — E559 Vitamin D deficiency, unspecified: Secondary | ICD-10-CM

## 2017-08-25 DIAGNOSIS — Z1159 Encounter for screening for other viral diseases: Secondary | ICD-10-CM

## 2017-08-25 DIAGNOSIS — R739 Hyperglycemia, unspecified: Secondary | ICD-10-CM

## 2017-08-25 DIAGNOSIS — E785 Hyperlipidemia, unspecified: Secondary | ICD-10-CM | POA: Diagnosis not present

## 2017-08-25 DIAGNOSIS — Z0184 Encounter for antibody response examination: Secondary | ICD-10-CM | POA: Diagnosis not present

## 2017-08-25 DIAGNOSIS — R05 Cough: Secondary | ICD-10-CM | POA: Diagnosis not present

## 2017-08-25 DIAGNOSIS — M5417 Radiculopathy, lumbosacral region: Secondary | ICD-10-CM | POA: Insufficient documentation

## 2017-08-25 DIAGNOSIS — R232 Flushing: Secondary | ICD-10-CM | POA: Insufficient documentation

## 2017-08-25 DIAGNOSIS — Z13220 Encounter for screening for lipoid disorders: Secondary | ICD-10-CM

## 2017-08-25 DIAGNOSIS — M5416 Radiculopathy, lumbar region: Secondary | ICD-10-CM

## 2017-08-25 DIAGNOSIS — Z1231 Encounter for screening mammogram for malignant neoplasm of breast: Secondary | ICD-10-CM | POA: Diagnosis not present

## 2017-08-25 MED ORDER — GABAPENTIN 300 MG PO CAPS: 300.0000 mg | ORAL_CAPSULE | Freq: Every day | ORAL | 0 refills | Status: DC

## 2017-08-25 NOTE — Patient Instructions (Addendum)
Gabapentin 300 mg at night may increase to 2x per day if tolerated  [facility] in [place] Dr. Ronald Lobo  Schedule mammogram  Schedule fasting labs    Bronchospasm, Adult Bronchospasm is a tightening of the airways going into the lungs. During an episode, it may be harder to breathe. You may cough, and you may make a whistling sound when you breathe (wheeze). This condition often affects people with asthma. What are the causes? This condition is caused by swelling and irritation in the airways. It can be triggered by:  An infection (common).  Seasonal allergies.  An allergic reaction.  Exercise.  Irritants. These include pollution, cigarette smoke, strong odors, aerosol sprays, and paint fumes.  Weather changes. Winds increase molds and pollens in the air. Cold air may cause swelling.  Stress and emotional upset.  What are the signs or symptoms? Symptoms of this condition include:  Wheezing. If the episode was triggered by an allergy, wheezing may start right away or hours later.  Nighttime coughing.  Frequent or severe coughing with a simple cold.  Chest tightness.  Shortness of breath.  Decreased ability to exercise.  How is this diagnosed? This condition is usually diagnosed with a review of your medical history and a physical exam. Tests, such as lung function tests, are sometimes done to look for other conditions. The need for a chest X-ray depends on where the wheezing occurs and whether it is the first time you have wheezed. How is this treated? This condition may be treated with:  Inhaled medicines. These open up the airways and help you breathe. They can be taken with an inhaler or a nebulizer device.  Corticosteroid medicines. These may be given for severe bronchospasm, usually when it is associated with asthma.  Avoiding triggers, such as irritants, infection, or allergies.  Follow these instructions at home: Medicines  Take  over-the-counter and prescription medicines only as told by your health care provider.  If you need to use an inhaler or nebulizer to take your medicine, ask your health care provider to explain how to use it correctly. If you were given a spacer, always use it with your inhaler. Lifestyle  Reduce the number of triggers in your home. To do this: ? Change your heating and air conditioning filter at least once a month. ? Limit your use of fireplaces and wood stoves. ? Do not smoke. Do not allow smoking in your home. ? Avoid using perfumes and fragrances. ? Get rid of pests, such as roaches and mice, and their droppings. ? Remove any mold from your home. ? Keep your house clean and dust free. Use unscented cleaning products. ? Replace carpet with wood, tile, or vinyl flooring. Carpet can trap dander and dust. ? Use allergy-proof pillows, mattress covers, and box spring covers. ? Wash bed sheets and blankets every week in hot water. Dry them in a dryer. ? Use blankets that are made of polyester or cotton. ? Wash your hands often. ? Do not allow pets in your bedroom.  Avoid breathing in cold air when you exercise. General instructions  Have a plan for seeking medical care. Know when to call your health care provider and local emergency services, and where to get emergency care.  Stay up to date on your immunizations.  When you have an episode of bronchospasm, stay calm. Try to relax and breathe more slowly.  If you have asthma, make sure you have an asthma action plan.  Keep all follow-up  visits as told by your health care provider. This is important. Contact a health care provider if:  You have muscle aches.  You have chest pain.  The mucus that you cough up (sputum) changes from clear or white to yellow, green, gray, or bloody.  You have a fever.  Your sputum gets thicker. Get help right away if:  Your wheezing and coughing get worse, even after you take your prescribed  medicines.  It gets even harder to breathe.  You develop severe chest pain. Summary  Bronchospasm is a tightening of the airways going into the lungs.  During an episode of bronchospasm, you may have a harder time breathing. You may cough and make a whistling sound when you breathe (wheeze).  Avoid exposure to triggers such as smoke, dust, mold, animal dander, and fragrances.  When you have an episode of bronchospasm, stay calm. Try to relax and breathe more slowly. This information is not intended to replace advice given to you by your health care provider. Make sure you discuss any questions you have with your health care provider. Document Released: 03/13/2003 Document Revised: 03/06/2016 Document Reviewed: 03/06/2016 Elsevier Interactive Patient Education  2017 [employer].

## 2017-08-25 NOTE — Progress Notes (Addendum)
Chief Complaint  Patient presents with  . Establish Care   Establish care former Dr. Glenaire  1. H/o chronic back pain s/p bulging disc s/p repair 08/2015 with Dr. Lynann Bologna L4/5 discectomy. Back was better until 10 or 01/2017 then flared again and having numbness and tingling down left leg to left foot. She is not taking any meds I.e mobic, robaxin, flexeril, duexis b/c they do not help. She is seeing [facility] Dr. Suezanne Cheshire in [place] offices who rec she have another MRI but she needs under conscious sedation at [facility].  Back pain is 7/10 She reports she is applying for disability  2. Still smoking <1/2 ppd smoking since age 7 and max 1 ppd  3. C/o hot flashes since s/p hysterectomy 2006.   Review of Systems  Constitutional: Negative for weight loss.  HENT: Negative for hearing loss.   Eyes: Negative for blurred vision.  Respiratory: Negative for shortness of breath.   Cardiovascular: Negative for chest pain.  Genitourinary:       +hot flashes   Musculoskeletal: Positive for back pain.  Skin: Negative for rash.  Neurological: Positive for sensory change.  Psychiatric/Behavioral: Negative for depression.   Past Medical History:  Diagnosis Date  . Chicken pox   . GERD (gastroesophageal reflux disease)   . Hyperlipidemia   . Kidney stones   . Pneumonia   . Thyroid disease   . Trigger finger of left thumb    Past Surgical History:  Procedure Laterality Date  . ABDOMINAL HYSTERECTOMY     2006   . BACK SURGERY  08/2015  . BACK SURGERY     L4/5 discectomy 2017 Dr. Lynann Bologna   . breast reduction     1994  . EXTRACORPOREAL SHOCK WAVE LITHOTRIPSY Right 06/07/2015   Procedure: EXTRACORPOREAL SHOCK WAVE LITHOTRIPSY (ESWL);  Surgeon: Royston Cowper, MD;  Location: [facility];  Service: Urology;  Laterality: Right;  . RADIOLOGY WITH ANESTHESIA N/A 07/19/2015   Procedure: MRI LUMBER SPIN WITHOUT;  Surgeon: Medication Radiologist, MD;  Location: [place];  Service: Radiology;   Laterality: N/A;  . SHOULDER OPEN ROTATOR CUFF REPAIR Bilateral 2007 2008   right 2007, left 2008   . TRIGGER FINGER RELEASE Left 02/18/2016   Procedure: LEFT TRIGGER THUMB RELEASE;  Surgeon: Milly Jakob, MD;  Location: [place];  Service: Orthopedics;  Laterality: Left;  . WISDOM TOOTH EXTRACTION     Family History  Problem Relation Age of Onset  . Arthritis Mother   . Early death Mother   . Heart disease Mother   . Hyperlipidemia Mother   . Hypertension Mother   . Kidney disease Mother   . Early death Father   . Diabetes Father   . Hyperlipidemia Father   . Heart disease Father   . Hypertension Father   . Kidney disease Father   . Asthma Daughter   . Depression Daughter   . Diabetes Maternal Grandmother   . Heart disease Maternal Grandmother   . Heart disease Maternal Grandfather   . Early death Paternal Grandmother   . Diabetes Paternal Grandmother   . Cancer Paternal Grandfather        prostate    Social History   Socioeconomic History  . Marital status: Married    Spouse name: Not on file  . Number of children: Not on file  . Years of education: Not on file  . Highest education level: Not on file  Occupational History  . Not on  file  Social Needs  . Financial resource strain: Not on file  . Food insecurity:    Worry: Not on file    Inability: Not on file  . Transportation needs:    Medical: Not on file    Non-medical: Not on file  Tobacco Use  . Smoking status: Current Every Day Smoker    Packs/day: 0.50    Years: 20.00    Pack years: 10.00    Types: Cigarettes  . Smokeless tobacco: Never Used  Substance and Sexual Activity  . Alcohol use: No  . Drug use: No  . Sexual activity: Yes  Lifestyle  . Physical activity:    Days per week: Not on file    Minutes per session: Not on file  . Stress: Not on file  Relationships  . Social connections:    Talks on phone: Not on file    Gets together: Not on file    Attends religious  service: Not on file    Active member of club or organization: Not on file    Attends meetings of clubs or organizations: Not on file    Relationship status: Not on file  . Intimate partner violence:    Fear of current or ex partner: Not on file    Emotionally abused: Not on file    Physically abused: Not on file    Forced sexual activity: Not on file  Other Topics Concern  . Not on file  Social History Narrative   Married    2 kids son going to army   Daughter going to [employer] fall 2019    Owns guns    Wears seat belt    Safe in relationship    No outpatient medications have been marked as taking for the 08/25/17 encounter (Office Visit) with McLean-Scocuzza, Nino Glow, MD.   No Known Allergies No results found for this or any previous visit (from the past 2160 hour(s)). Objective  Body mass index is 32.78 kg/m. Wt Readings from Last 3 Encounters:  08/25/17 197 lb (89.4 kg)  02/12/17 185 lb (83.9 kg)  02/18/16 197 lb (89.4 kg)   Temp Readings from Last 3 Encounters:  08/25/17 98.6 F (37 C) (Oral)  02/12/17 98.5 F (36.9 C) (Oral)  02/18/16 98.1 F (36.7 C)   BP Readings from Last 3 Encounters:  08/25/17 100/60  02/12/17 124/78  02/18/16 (!) 122/55   Pulse Readings from Last 3 Encounters:  08/25/17 88  02/12/17 85  02/18/16 71    Physical Exam  Constitutional: She is oriented to person, place, and time. Vital signs are normal. She appears well-developed and well-nourished. She is cooperative.  HENT:  Head: Normocephalic and atraumatic.  Mouth/Throat: Oropharynx is clear and moist and mucous membranes are normal.  Eyes: Pupils are equal, round, and reactive to light. Conjunctivae are normal.  Cardiovascular: Normal rate, regular rhythm and normal heart sounds.  Pulmonary/Chest: Effort normal. She has wheezes.  Musculoskeletal: She exhibits tenderness.  Neurological: She is alert and oriented to person, place, and time. Gait normal.  Skin: Skin is warm, dry and  intact.  Psychiatric: She has a normal mood and affect. Her speech is normal and behavior is normal. Judgment and thought content normal. Cognition and memory are normal.  Nursing note and vitals reviewed.   Assessment   1. Chronic back pain with lumbar radiculopathy left s/p L4/5 discectomy 08/2015 Dr. Lynann Bologna  2. Tobacco abuse with bronchospasm  3. Hot flashes  4. HM Plan  1. Will repeat MRI lumbar and fax to Dr. Baldwin Crown in future  Needs MRI at [facility] conscious sedation Trial of gabapentin 300 mg qhs if can tolerate inc. To bid has been on tid in the past  2. rec cessation smoking <1 ppd pt not ready to quit  3. Trial of gabapentin 4.  Never flu shot  Tdap ? Last with Dr. Clemmie Krill check [facility]   Referred mammo, get copy [facility]  Get copy prior [facility] s/p hysterectomy in 2006/2007 for endometriosis  sch fasting labs  rec smoknig cessation smoker since age 53 y.o max 1 ppd now < 1ppd   Get records [facility], Dr. Clemmie Krill ,[facility]  [facility]  [facility] Dr. Bronson Curb  Reviewed records [facility]:  CT ab pelvis 04/26/15 right kidney stone, left kidney stone [place] 08/26/16 head and neck small bl cystic and partially cystic nodules do not meet criteria for bx or further f/u  CXR 08/25/16 negative    Provider: Dr. Olivia Mackie McLean-Scocuzza-Internal Medicine

## 2017-08-25 NOTE — Progress Notes (Signed)
Pre visit review using our clinic review tool, if applicable. No additional management support is needed unless otherwise documented below in the visit note. 

## 2017-08-26 ENCOUNTER — Telehealth: Payer: Self-pay | Admitting: *Deleted

## 2017-08-26 NOTE — Telephone Encounter (Signed)
Copied from [place] [id number]. Topic: Referral - Question >> Aug 26, 2017 12:44 PM Synthia Innocent wrote: Reason for CRM: Returning call to schedule MRI under sedation.

## 2017-08-28 ENCOUNTER — Other Ambulatory Visit (INDEPENDENT_AMBULATORY_CARE_PROVIDER_SITE_OTHER): Payer: BLUE CROSS/BLUE SHIELD

## 2017-08-28 ENCOUNTER — Telehealth: Payer: Self-pay | Admitting: Internal Medicine

## 2017-08-28 DIAGNOSIS — Z1329 Encounter for screening for other suspected endocrine disorder: Secondary | ICD-10-CM

## 2017-08-28 DIAGNOSIS — E785 Hyperlipidemia, unspecified: Secondary | ICD-10-CM

## 2017-08-28 DIAGNOSIS — M5416 Radiculopathy, lumbar region: Secondary | ICD-10-CM

## 2017-08-28 DIAGNOSIS — R739 Hyperglycemia, unspecified: Secondary | ICD-10-CM | POA: Diagnosis not present

## 2017-08-28 DIAGNOSIS — Z13220 Encounter for screening for lipoid disorders: Secondary | ICD-10-CM

## 2017-08-28 DIAGNOSIS — Z0184 Encounter for antibody response examination: Secondary | ICD-10-CM

## 2017-08-28 DIAGNOSIS — Z1159 Encounter for screening for other viral diseases: Secondary | ICD-10-CM

## 2017-08-28 DIAGNOSIS — E559 Vitamin D deficiency, unspecified: Secondary | ICD-10-CM

## 2017-08-28 DIAGNOSIS — R232 Flushing: Secondary | ICD-10-CM

## 2017-08-28 DIAGNOSIS — Z1389 Encounter for screening for other disorder: Secondary | ICD-10-CM | POA: Diagnosis not present

## 2017-08-28 NOTE — Progress Notes (Signed)
No immunizations found in [place]

## 2017-08-28 NOTE — Telephone Encounter (Signed)
ROI from [facility] received on 08/28/2017.

## 2017-08-28 NOTE — Addendum Note (Signed)
Addended by: Arby Barrette on: 08/28/2017 09:23 AM   Modules accepted: Orders

## 2017-08-29 LAB — URINALYSIS, ROUTINE W REFLEX MICROSCOPIC
Bilirubin, UA: NEGATIVE
Glucose, UA: NEGATIVE
KETONES UA: NEGATIVE
LEUKOCYTES UA: NEGATIVE
Nitrite, UA: NEGATIVE
Protein, UA: NEGATIVE
RBC, UA: NEGATIVE
SPEC GRAV UA: 1.026 (ref 1.005–1.030)
Urobilinogen, Ur: 0.2 mg/dL (ref 0.2–1.0)
pH, UA: 5.5 (ref 5.0–7.5)

## 2017-08-31 ENCOUNTER — Other Ambulatory Visit: Payer: Self-pay | Admitting: Internal Medicine

## 2017-08-31 DIAGNOSIS — Z720 Tobacco use: Secondary | ICD-10-CM | POA: Insufficient documentation

## 2017-08-31 DIAGNOSIS — D751 Secondary polycythemia: Secondary | ICD-10-CM | POA: Insufficient documentation

## 2017-08-31 DIAGNOSIS — E559 Vitamin D deficiency, unspecified: Secondary | ICD-10-CM

## 2017-08-31 DIAGNOSIS — R7303 Prediabetes: Secondary | ICD-10-CM

## 2017-08-31 DIAGNOSIS — E785 Hyperlipidemia, unspecified: Secondary | ICD-10-CM | POA: Insufficient documentation

## 2017-08-31 DIAGNOSIS — E119 Type 2 diabetes mellitus without complications: Secondary | ICD-10-CM | POA: Insufficient documentation

## 2017-08-31 LAB — CBC WITH DIFFERENTIAL/PLATELET
BASOS ABS: 53 {cells}/uL (ref 0–200)
BASOS PCT: 0.9 %
EOS ABS: 89 {cells}/uL (ref 15–500)
Eosinophils Relative: 1.5 %
HEMATOCRIT: 46.4 % — AB (ref 35.0–45.0)
HEMOGLOBIN: 15.7 g/dL — AB (ref 11.7–15.5)
LYMPHS ABS: 2230 {cells}/uL (ref 850–3900)
MCH: 29.3 pg (ref 27.0–33.0)
MCHC: 33.8 g/dL (ref 32.0–36.0)
MCV: 86.7 fL (ref 80.0–100.0)
MPV: 10.5 fL (ref 7.5–12.5)
Monocytes Relative: 9.5 %
NEUTROS ABS: 2968 {cells}/uL (ref 1500–7800)
Neutrophils Relative %: 50.3 %
Platelets: 297 10*3/uL (ref 140–400)
RBC: 5.35 10*6/uL — ABNORMAL HIGH (ref 3.80–5.10)
RDW: 13.5 % (ref 11.0–15.0)
Total Lymphocyte: 37.8 %
WBC mixed population: 561 cells/uL (ref 200–950)
WBC: 5.9 10*3/uL (ref 3.8–10.8)

## 2017-08-31 LAB — HEMOGLOBIN A1C
HEMOGLOBIN A1C: 6.1 %{Hb} — AB (ref <5.7)
MEAN PLASMA GLUCOSE: 128 (calc)
eAG (mmol/L): 7.1 (calc)

## 2017-08-31 LAB — COMPREHENSIVE METABOLIC PANEL
AG RATIO: 1.4 (calc) (ref 1.0–2.5)
ALKALINE PHOSPHATASE (APISO): 92 U/L (ref 33–115)
ALT: 17 U/L (ref 6–29)
AST: 15 U/L (ref 10–35)
Albumin: 4.1 g/dL (ref 3.6–5.1)
BILIRUBIN TOTAL: 0.3 mg/dL (ref 0.2–1.2)
BUN: 16 mg/dL (ref 7–25)
CALCIUM: 9.3 mg/dL (ref 8.6–10.2)
CO2: 23 mmol/L (ref 20–32)
Chloride: 105 mmol/L (ref 98–110)
Creat: 0.78 mg/dL (ref 0.50–1.10)
Globulin: 2.9 g/dL (calc) (ref 1.9–3.7)
Glucose, Bld: 102 mg/dL — ABNORMAL HIGH (ref 65–99)
Potassium: 4.2 mmol/L (ref 3.5–5.3)
SODIUM: 140 mmol/L (ref 135–146)
Total Protein: 7 g/dL (ref 6.1–8.1)

## 2017-08-31 LAB — LIPID PANEL
Cholesterol: 209 mg/dL — ABNORMAL HIGH (ref <200)
HDL: 36 mg/dL — ABNORMAL LOW (ref >50)
LDL CHOLESTEROL (CALC): 146 mg/dL — AB
Non-HDL Cholesterol (Calc): 173 mg/dL (calc) — ABNORMAL HIGH (ref <130)
Total CHOL/HDL Ratio: 5.8 (calc) — ABNORMAL HIGH (ref <5.0)
Triglycerides: 145 mg/dL (ref <150)

## 2017-08-31 LAB — HEPATITIS B SURFACE ANTIBODY, QUANTITATIVE: Hepatitis B-Post: 102 m[IU]/mL (ref >=10)

## 2017-08-31 LAB — T4, FREE: Free T4: 1.1 ng/dL (ref 0.8–1.8)

## 2017-08-31 LAB — MEASLES/MUMPS/RUBELLA IMMUNITY
Mumps IgG: 225 AU/mL
RUBELLA: 5.14 {index}
Rubeola IgG: 52.9 AU/mL

## 2017-08-31 LAB — TSH: TSH: 0.93 m[IU]/L

## 2017-08-31 LAB — VITAMIN D 25 HYDROXY (VIT D DEFICIENCY, FRACTURES): Vit D, 25-Hydroxy: 9 ng/mL — ABNORMAL LOW (ref 30–100)

## 2017-08-31 MED ORDER — CHOLECALCIFEROL 1.25 MG (50000 UT) PO CAPS: 50000.0000 [IU] | ORAL_CAPSULE | ORAL | 1 refills | Status: DC

## 2017-09-01 ENCOUNTER — Encounter: Payer: Self-pay | Admitting: Internal Medicine

## 2017-09-02 ENCOUNTER — Encounter: Payer: Self-pay | Admitting: Internal Medicine

## 2017-09-02 ENCOUNTER — Other Ambulatory Visit: Payer: Self-pay | Admitting: Internal Medicine

## 2017-09-02 DIAGNOSIS — J40 Bronchitis, not specified as acute or chronic: Secondary | ICD-10-CM

## 2017-09-02 DIAGNOSIS — J42 Unspecified chronic bronchitis: Secondary | ICD-10-CM

## 2017-09-02 MED ORDER — ALBUTEROL SULFATE HFA 108 (90 BASE) MCG/ACT IN AERS: 1.0000 | INHALATION_SPRAY | Freq: Four times a day (QID) | RESPIRATORY_TRACT | 11 refills | Status: DC | PRN

## 2017-09-02 NOTE — Progress Notes (Signed)
neg pap neg HPV Dr. Ammie Dalton 02/01/15  Mammogram [facility] 02/01/15 negative

## 2017-09-07 ENCOUNTER — Encounter: Payer: Self-pay | Admitting: Internal Medicine

## 2017-09-08 ENCOUNTER — Ambulatory Visit: Payer: BLUE CROSS/BLUE SHIELD | Admitting: Family Medicine

## 2017-09-08 ENCOUNTER — Encounter: Payer: Self-pay | Admitting: Family Medicine

## 2017-09-08 VITALS — BP 120/70 | HR 72 | Temp 98.7°F | Wt 196.5 lb

## 2017-09-08 DIAGNOSIS — H6991 Unspecified Eustachian tube disorder, right ear: Secondary | ICD-10-CM

## 2017-09-08 DIAGNOSIS — H9201 Otalgia, right ear: Secondary | ICD-10-CM

## 2017-09-08 DIAGNOSIS — H6981 Other specified disorders of Eustachian tube, right ear: Secondary | ICD-10-CM

## 2017-09-08 MED ORDER — FLUTICASONE PROPIONATE 50 MCG/ACT NA SUSP: 2.0000 | Freq: Every day | NASAL | 6 refills | Status: DC

## 2017-09-08 NOTE — Patient Instructions (Signed)
Please choose either Allegra, Claritin, or Zyrtec for symptoms with flonase.  You can take ibuprofen 2 to 3 tablets with food as needed. This medication should not be taken with Duexis as ibuprofen is included in this medication. So, both cannot be taken together.  If symptoms do not improve with treatment, worsen, or new symptoms develop, follow up for further evaluation and treatment.   Eustachian Tube Dysfunction The eustachian tube connects the middle ear to the back of the nose. It regulates air pressure in the middle ear by allowing air to move between the ear and nose. It also helps to drain fluid from the middle ear space. When the eustachian tube does not function properly, air pressure, fluid, or both can build up in the middle ear. Eustachian tube dysfunction can affect one or both ears. What are the causes? This condition happens when the eustachian tube becomes blocked or cannot open normally. This may result from:  Ear infections.  Colds and other upper respiratory infections.  Allergies.  Irritation, such as from cigarette smoke or acid from the stomach coming up into the esophagus (gastroesophageal reflux).  Sudden changes in air pressure, such as from descending in an airplane.  Abnormal growths in the nose or throat, such as nasal polyps, tumors, or enlarged tissue at the back of the throat (adenoids).  What increases the risk? This condition may be more likely to develop in people who smoke and people who are overweight. Eustachian tube dysfunction may also be more likely to develop in children, especially children who have:  Certain birth defects of the mouth, such as cleft palate.  Large tonsils and adenoids.  What are the signs or symptoms? Symptoms of this condition may include:  A feeling of fullness in the ear.  Ear pain.  Clicking or popping noises in the ear.  Ringing in the ear.  Hearing loss.  Loss of balance.  Symptoms may get worse when  the air pressure around you changes, such as when you travel to an area of high elevation or fly on an airplane. How is this diagnosed? This condition may be diagnosed based on:  Your symptoms.  A physical exam of your ear, nose, and throat.  Tests, such as those that measure: ? The movement of your eardrum (tympanogram). ? Your hearing (audiometry).  How is this treated? Treatment depends on the cause and severity of your condition. If your symptoms are mild, you may be able to relieve your symptoms by moving air into ("popping") your ears. If you have symptoms of fluid in your ears, treatment may include:  Decongestants.  Antihistamines.  Nasal sprays or ear drops that contain medicines that reduce swelling (steroids).  In some cases, you may need to have a procedure to drain the fluid in your eardrum (myringotomy). In this procedure, a small tube is placed in the eardrum to:  Drain the fluid.  Restore the air in the middle ear space.  Follow these instructions at home:  Take over-the-counter and prescription medicines only as told by your health care provider.  Use techniques to help pop your ears as recommended by your health care provider. These may include: ? Chewing gum. ? Yawning. ? Frequent, forceful swallowing. ? Closing your mouth, holding your nose closed, and gently blowing as if you are trying to blow air out of your nose.  Do not do any of the following until your health care provider approves: ? Travel to high altitudes. ? Fly in airplanes. ?  Work in a Pension scheme manager or room. ? Scuba dive.  Keep your ears dry. Dry your ears completely after showering or bathing.  Do not smoke.  Keep all follow-up visits as told by your health care provider. This is important. Contact a health care provider if:  Your symptoms do not go away after treatment.  Your symptoms come back after treatment.  You are unable to pop your ears.  You have: ? A  fever. ? Pain in your ear. ? Pain in your head or neck. ? Fluid draining from your ear.  Your hearing suddenly changes.  You become very dizzy.  You lose your balance. This information is not intended to replace advice given to you by your health care provider. Make sure you discuss any questions you have with your health care provider. Document Released: 04/06/2015 Document Revised: 08/16/2015 Document Reviewed: 03/29/2014 Elsevier Interactive Patient Education  [employer].

## 2017-09-08 NOTE — Progress Notes (Signed)
Subjective:    Patient ID: Lori Lang, female    DOB: [birth year], 47 y.o.   MRN: [id number]  HPI  Lori Lang is a 47 year old female who presents with right ear pain that started one week ago. This pain has improved over the past week. She reports associated "clicking" when swallowing. Pain rated as zero today but noted previously 5 days ago as an 8 or 9. She denies fever, chills, sweats, rhinitis, sore throat, or cough. She reports feeling well overall and states that the ear pain has improved. Nothing aggravates symptoms.  No treatments have been tried at home. History of AOM 02/12/17 which improved with Augmentin.  No recent antibiotic use or recent sick contact exposure.  No history of asthma; recently noted to have chronic bronchitis and inhaler provided.  She denies SOB today  She is a current smoker with a 10 pack year history.  Review of Systems  Constitutional: Negative for chills, fatigue and fever.  HENT: Positive for ear pain. Negative for congestion, hearing loss, nosebleeds, postnasal drip, rhinorrhea, sinus pressure, sinus pain, sneezing, sore throat and tinnitus.   Eyes: Negative for itching.  Respiratory: Negative for cough, shortness of breath and wheezing.   Cardiovascular: Negative for chest pain and palpitations.  Gastrointestinal: Negative for nausea and vomiting.  Skin: Negative for rash.  Neurological: Negative for dizziness, light-headedness and headaches.   Past Medical History:  Diagnosis Date  . Chicken pox   . GERD (gastroesophageal reflux disease)   . Hyperlipidemia   . Kidney stones   . Pneumonia   . Thyroid disease   . Trigger finger of left thumb      Social History   Socioeconomic History  . Marital status: Married    Spouse name: Not on file  . Number of children: Not on file  . Years of education: Not on file  . Highest education level: Not on file  Occupational History  . Not on file  Social Needs  . Financial resource  strain: Not on file  . Food insecurity:    Worry: Not on file    Inability: Not on file  . Transportation needs:    Medical: Not on file    Non-medical: Not on file  Tobacco Use  . Smoking status: Current Every Day Smoker    Packs/day: 0.50    Years: 20.00    Pack years: 10.00    Types: Cigarettes  . Smokeless tobacco: Never Used  Substance and Sexual Activity  . Alcohol use: No  . Drug use: No  . Sexual activity: Yes  Lifestyle  . Physical activity:    Days per week: Not on file    Minutes per session: Not on file  . Stress: Not on file  Relationships  . Social connections:    Talks on phone: Not on file    Gets together: Not on file    Attends religious service: Not on file    Active member of club or organization: Not on file    Attends meetings of clubs or organizations: Not on file    Relationship status: Not on file  . Intimate partner violence:    Fear of current or ex partner: Not on file    Emotionally abused: Not on file    Physically abused: Not on file    Forced sexual activity: Not on file  Other Topics Concern  . Not on file  Social History Narrative   Married  2 kids son going to army   Daughter going to [employer] fall 2019    Owns guns    Wears seat belt    Safe in relationship     Past Surgical History:  Procedure Laterality Date  . ABDOMINAL HYSTERECTOMY     2006   . BACK SURGERY  08/2015  . BACK SURGERY     L4/5 discectomy 2017 Dr. Lynann Bologna   . breast reduction     1994  . EXTRACORPOREAL SHOCK WAVE LITHOTRIPSY Right 06/07/2015   Procedure: EXTRACORPOREAL SHOCK WAVE LITHOTRIPSY (ESWL);  Surgeon: Royston Cowper, MD;  Location: [facility];  Service: Urology;  Laterality: Right;  . RADIOLOGY WITH ANESTHESIA N/A 07/19/2015   Procedure: MRI LUMBER SPIN WITHOUT;  Surgeon: Medication Radiologist, MD;  Location: [place];  Service: Radiology;  Laterality: N/A;  . SHOULDER OPEN ROTATOR CUFF REPAIR Bilateral 2007 2008   right 2007, left 2008   . TRIGGER  FINGER RELEASE Left 02/18/2016   Procedure: LEFT TRIGGER THUMB RELEASE;  Surgeon: Milly Jakob, MD;  Location: [place];  Service: Orthopedics;  Laterality: Left;  . WISDOM TOOTH EXTRACTION      Family History  Problem Relation Age of Onset  . Arthritis Mother   . Early death Mother   . Heart disease Mother   . Hyperlipidemia Mother   . Hypertension Mother   . Kidney disease Mother   . Early death Father   . Diabetes Father   . Hyperlipidemia Father   . Heart disease Father   . Hypertension Father   . Kidney disease Father   . Asthma Daughter   . Depression Daughter   . Diabetes Maternal Grandmother   . Heart disease Maternal Grandmother   . Heart disease Maternal Grandfather   . Early death Paternal Grandmother   . Diabetes Paternal Grandmother   . Cancer Paternal Grandfather        prostate     No Known Allergies  Current Outpatient Medications on File Prior to Visit  Medication Sig Dispense Refill  . albuterol (PROVENTIL HFA;VENTOLIN HFA) 108 (90 Base) MCG/ACT inhaler Inhale 1-2 puffs into the lungs every 6 (six) hours as needed for wheezing or shortness of breath. 1 Inhaler 11  . Cholecalciferol 50000 units capsule Take 1 capsule (50,000 Units total) by mouth once a week. 13 capsule 1  . cyclobenzaprine (FLEXERIL) 10 MG tablet TK 1 T PO BID PRN  2  . DUEXIS 800-26.6 MG TABS Take 1 tablet by mouth 3 (three) times daily.  3  . gabapentin (NEURONTIN) 300 MG capsule Take 1 capsule (300 mg total) by mouth at bedtime. May increase to 2x per day if tolerated 180 capsule 0  . meloxicam (MOBIC) 15 MG tablet Take 15 mg by mouth as needed.     . methocarbamol (ROBAXIN) 500 MG tablet Take 500 mg by mouth as needed.      No current facility-administered medications on file prior to visit.     BP 120/70 (BP Location: Left Arm, Patient Position: Sitting, Cuff Size: Normal)   Pulse 72   Temp 98.7 F (37.1 C) (Oral)   Wt 196 lb 8 oz (89.1 kg)   SpO2 96%   BMI  32.70 kg/m       Objective:   Physical Exam  Constitutional: She is oriented to person, place, and time. She appears well-developed and well-nourished.  HENT:  Right Ear: Hearing normal. No tenderness. Tympanic membrane is not erythematous, not retracted and not  bulging. No decreased hearing is noted.  Left Ear: Tympanic membrane normal. No decreased hearing is noted.  Nose: No rhinorrhea. Right sinus exhibits no maxillary sinus tenderness and no frontal sinus tenderness. Left sinus exhibits no maxillary sinus tenderness and no frontal sinus tenderness.  Mouth/Throat: Oropharynx is clear and moist and mucous membranes are normal.  Right TM dull, light reflex present  Eyes: Pupils are equal, round, and reactive to light. No scleral icterus.  Neck: Neck supple.  Cardiovascular: Normal rate and regular rhythm.  Pulmonary/Chest: Effort normal and breath sounds normal. She has no wheezes. She has no rales.  Abdominal: Soft. Bowel sounds are normal. There is no tenderness.  Lymphadenopathy:    She has no cervical adenopathy.  Neurological: She is alert and oriented to person, place, and time.  Skin: Skin is warm and dry. No rash noted.  Psychiatric: She has a normal mood and affect. Her behavior is normal. Judgment and thought content normal.       Assessment & Plan:  1. Right ear pain Improved; no pain with exam today. Right TM dull; light reflex present. No fever today and pain has improved and is not present during exam.   2. Dysfunction of right eustachian tube Clicking sound reported by patient. Symptoms are most consistent with eustachian tube dysfunction. Will trial antihistamine, either Allegra, Claritin, or Zyrtec with flonase for symptoms.   - fluticasone (FLONASE) 50 MCG/ACT nasal spray; Place 2 sprays into both nostrils daily.  Dispense: 16 g; Refill: 6  Advised use of ibuprofen 2 to 3 tablets with food for discomfort if needed. Further advised her that she should not take  ibuprofen if she is taking Duexis which she reports that she is not taking at this time. She voiced understanding and agreed with plan.  Return precautions provided. Follow up if symptoms do not improve, worsen, or she develops fever, pain in head/neck or fluid coming from ear.  Delano Metz, FNP-C

## 2017-09-20 ENCOUNTER — Encounter: Payer: Self-pay | Admitting: Internal Medicine

## 2017-09-21 ENCOUNTER — Other Ambulatory Visit: Payer: Self-pay

## 2017-09-21 ENCOUNTER — Encounter (HOSPITAL_COMMUNITY): Payer: Self-pay | Admitting: *Deleted

## 2017-09-21 NOTE — Anesthesia Preprocedure Evaluation (Addendum)
Anesthesia Evaluation  Patient identified by MRN, date of birth, ID band Patient awake    Reviewed: Allergy & Precautions, NPO status , Patient's Chart, lab work & pertinent test results  Airway Mallampati: II  TM Distance: >3 FB Neck ROM: Full    Dental  (+) Dental Advisory Given   Pulmonary Current Smoker,    breath sounds clear to auscultation       Cardiovascular negative cardio ROS   Rhythm:Regular Rate:Normal     Neuro/Psych  Neuromuscular disease    GI/Hepatic Neg liver ROS, GERD  ,  Endo/Other  negative endocrine ROS  Renal/GU negative Renal ROS     Musculoskeletal   Abdominal   Peds  Hematology negative hematology ROS (+)   Anesthesia Other Findings   Reproductive/Obstetrics                            Anesthesia Physical Anesthesia Plan  ASA: II  Anesthesia Plan: MAC   Post-op Pain Management:    Induction: Intravenous  PONV Risk Score and Plan: 1 and Ondansetron, Propofol infusion and Treatment may vary due to age or medical condition  Airway Management Planned: Natural Airway and Simple Face Mask  Additional Equipment:   Intra-op Plan:   Post-operative Plan:   Informed Consent: I have reviewed the patients History and Physical, chart, labs and discussed the procedure including the risks, benefits and alternatives for the proposed anesthesia with the patient or authorized representative who has indicated his/her understanding and acceptance.   Dental advisory given  Plan Discussed with: CRNA  Anesthesia Plan Comments:        Anesthesia Quick Evaluation

## 2017-09-22 ENCOUNTER — Ambulatory Visit (HOSPITAL_COMMUNITY)
Admission: RE | Admit: 2017-09-22 | Discharge: 2017-09-22 | Disposition: A | Discharge status: Home / Self Care | Payer: BLUE CROSS/BLUE SHIELD | Source: Ambulatory Visit | Attending: Internal Medicine | Admitting: Internal Medicine

## 2017-09-22 ENCOUNTER — Ambulatory Visit (HOSPITAL_COMMUNITY): Payer: BLUE CROSS/BLUE SHIELD | Admitting: Anesthesiology

## 2017-09-22 ENCOUNTER — Encounter (HOSPITAL_COMMUNITY): Payer: Self-pay | Admitting: *Deleted

## 2017-09-22 ENCOUNTER — Encounter (HOSPITAL_COMMUNITY)
Admission: RE | Disposition: A | Discharge status: Home / Self Care | Payer: Self-pay | Source: Ambulatory Visit | Attending: Internal Medicine

## 2017-09-22 DIAGNOSIS — M5416 Radiculopathy, lumbar region: Secondary | ICD-10-CM | POA: Diagnosis not present

## 2017-09-22 DIAGNOSIS — J40 Bronchitis, not specified as acute or chronic: Secondary | ICD-10-CM | POA: Diagnosis not present

## 2017-09-22 DIAGNOSIS — E559 Vitamin D deficiency, unspecified: Secondary | ICD-10-CM | POA: Diagnosis not present

## 2017-09-22 DIAGNOSIS — M545 Low back pain: Secondary | ICD-10-CM | POA: Diagnosis not present

## 2017-09-22 DIAGNOSIS — E785 Hyperlipidemia, unspecified: Secondary | ICD-10-CM | POA: Diagnosis not present

## 2017-09-22 DIAGNOSIS — F17210 Nicotine dependence, cigarettes, uncomplicated: Secondary | ICD-10-CM | POA: Insufficient documentation

## 2017-09-22 HISTORY — DX: Prediabetes: R73.03

## 2017-09-22 HISTORY — DX: Personal history of urinary calculi: Z87.442

## 2017-09-22 HISTORY — PX: RADIOLOGY WITH ANESTHESIA: SHX6223

## 2017-09-22 LAB — CBC
HCT: 45.9 % (ref 36.0–46.0)
HEMOGLOBIN: 15 g/dL (ref 12.0–15.0)
MCH: 29.1 pg (ref 26.0–34.0)
MCHC: 32.7 g/dL (ref 30.0–36.0)
MCV: 89 fL (ref 78.0–100.0)
PLATELETS: 316 10*3/uL (ref 150–400)
RBC: 5.16 MIL/uL — AB (ref 3.87–5.11)
RDW: 14.4 % (ref 11.5–15.5)
WBC: 7.1 10*3/uL (ref 4.0–10.5)

## 2017-09-22 SURGERY — MRI WITH ANESTHESIA
Anesthesia: Monitor Anesthesia Care

## 2017-09-22 MED ORDER — FENTANYL CITRATE (PF) 100 MCG/2ML IJ SOLN: 25.0000 ug | INTRAMUSCULAR | Status: DC | PRN

## 2017-09-22 MED ORDER — MIDAZOLAM HCL 2 MG/2ML IJ SOLN
INTRAMUSCULAR | Status: DC | PRN
  Administered 2017-09-22: 2 mg via INTRAVENOUS
  Administered 2017-09-22: 1 mg via INTRAVENOUS

## 2017-09-22 MED ORDER — LACTATED RINGERS IV SOLN
INTRAVENOUS | Status: DC
  Administered 2017-09-22: 08:00:00 via INTRAVENOUS

## 2017-09-22 MED ORDER — FENTANYL CITRATE (PF) 100 MCG/2ML IJ SOLN
INTRAMUSCULAR | Status: DC | PRN
  Administered 2017-09-22: 50 ug via INTRAVENOUS
  Administered 2017-09-22: 100 ug via INTRAVENOUS
  Administered 2017-09-22: 50 ug via INTRAVENOUS

## 2017-09-22 NOTE — Anesthesia Procedure Notes (Signed)
Procedure Name: MAC Date/Time: 09/22/2017 8:10 AM Performed by: Colin Benton, CRNA Pre-anesthesia Checklist: Patient identified, Suction available, Patient being monitored and Emergency Drugs available Patient Re-evaluated:Patient Re-evaluated prior to induction Oxygen Delivery Method: Nasal cannula Preoxygenation: Pre-oxygenation with 100% oxygen Induction Type: IV induction Placement Confirmation: positive ETCO2

## 2017-09-22 NOTE — Anesthesia Postprocedure Evaluation (Signed)
Anesthesia Post Note  Patient: Lori Lang  Procedure(s) Performed: MRI LUMBAR SPINE WITHOUT CONTRAST (N/A )     Patient location during evaluation: PACU Anesthesia Type: MAC Level of consciousness: awake and alert Pain management: pain level controlled Vital Signs Assessment: post-procedure vital signs reviewed and stable Respiratory status: spontaneous breathing, nonlabored ventilation, respiratory function stable and patient connected to nasal cannula oxygen Cardiovascular status: stable and blood pressure returned to baseline Postop Assessment: no apparent nausea or vomiting Anesthetic complications: no    Last Vitals:  Vitals:   09/22/17 0926 09/22/17 0935  BP: 121/79 123/77  Pulse: 72 64  Resp: 12 16  Temp: (!) 36.4 C (!) 36.4 C  SpO2: 96% 94%    Last Pain:  Vitals:   09/22/17 0935  TempSrc:   PainSc: 0-No pain                 Tiajuana Amass

## 2017-09-22 NOTE — Discharge Instructions (Signed)

## 2017-09-22 NOTE — Progress Notes (Signed)
Release form faxed to MRI by Earlie Server PACU RN. [place], RN picked up MRI CD at Radiology and delivered to patient in [place] [facility].   Witnessed by Aleatha Borer, PACU RN.

## 2017-09-22 NOTE — Transfer of Care (Signed)
Immediate Anesthesia Transfer of Care Note  Patient: Lori Lang  Procedure(s) Performed: MRI LUMBAR SPINE WITHOUT CONTRAST (N/A )  Patient Location: PACU  Anesthesia Type:MAC  Level of Consciousness: awake, alert , oriented and patient cooperative  Airway & Oxygen Therapy: Patient Spontanous Breathing and Patient connected to nasal cannula oxygen  Post-op Assessment: Report given to RN, Post -op Vital signs reviewed and stable and Patient moving all extremities X 4  Post vital signs: Reviewed and stable  Last Vitals:  Vitals Value Taken Time  BP    Temp    Pulse 65 09/22/2017  9:02 AM  Resp 15 09/22/2017  9:02 AM  SpO2 95 % 09/22/2017  9:02 AM  Vitals shown include unvalidated device data.  Last Pain:  Vitals:   09/22/17 0900  TempSrc:   PainSc: (P) 0-No pain      Patients Stated Pain Goal: 7 (43/27/61 4709)  Complications: No apparent anesthesia complications

## 2017-09-23 ENCOUNTER — Encounter (HOSPITAL_COMMUNITY): Payer: Self-pay | Admitting: Radiology

## 2017-10-01 ENCOUNTER — Encounter: Payer: Self-pay | Admitting: Internal Medicine

## 2017-10-06 ENCOUNTER — Other Ambulatory Visit: Payer: Self-pay | Admitting: Internal Medicine

## 2017-10-06 DIAGNOSIS — M545 Low back pain: Principal | ICD-10-CM

## 2017-10-06 DIAGNOSIS — G8929 Other chronic pain: Secondary | ICD-10-CM

## 2017-10-06 MED ORDER — OXYCODONE-ACETAMINOPHEN 5-325 MG PO TABS: 1.0000 | ORAL_TABLET | Freq: Two times a day (BID) | ORAL | 0 refills | Status: DC | PRN

## 2017-10-08 DIAGNOSIS — M545 Low back pain: Secondary | ICD-10-CM | POA: Diagnosis not present

## 2017-12-14 ENCOUNTER — Encounter: Payer: Self-pay | Admitting: Emergency Medicine

## 2017-12-14 ENCOUNTER — Emergency Department
Admission: EM | Admit: 2017-12-14 | Discharge: 2017-12-14 | Disposition: A | Discharge status: Home / Self Care | Payer: BLUE CROSS/BLUE SHIELD | Attending: Emergency Medicine | Admitting: Emergency Medicine

## 2017-12-14 DIAGNOSIS — T7840XA Allergy, unspecified, initial encounter: Secondary | ICD-10-CM | POA: Insufficient documentation

## 2017-12-14 DIAGNOSIS — F17210 Nicotine dependence, cigarettes, uncomplicated: Secondary | ICD-10-CM | POA: Insufficient documentation

## 2017-12-14 DIAGNOSIS — Z79899 Other long term (current) drug therapy: Secondary | ICD-10-CM | POA: Insufficient documentation

## 2017-12-14 MED ORDER — RACEPINEPHRINE HCL 2.25 % IN NEBU
0.5000 mL | INHALATION_SOLUTION | Freq: Once | RESPIRATORY_TRACT | Status: AC
  Administered 2017-12-14: 0.5 mL via RESPIRATORY_TRACT
  Filled 2017-12-14: qty 0.5

## 2017-12-14 MED ORDER — PREDNISONE 20 MG PO TABS: 60.0000 mg | ORAL_TABLET | Freq: Every day | ORAL | 0 refills | Status: DC

## 2017-12-14 MED ORDER — DIPHENHYDRAMINE HCL 25 MG PO CAPS
25.0000 mg | ORAL_CAPSULE | Freq: Once | ORAL | Status: AC
  Administered 2017-12-14: 25 mg via ORAL
  Filled 2017-12-14: qty 1

## 2017-12-14 MED ORDER — EPINEPHRINE 0.3 MG/0.3ML IJ SOAJ: 0.3000 mg | Freq: Once | INTRAMUSCULAR | 0 refills | Status: AC

## 2017-12-14 MED ORDER — PREDNISONE 20 MG PO TABS
60.0000 mg | ORAL_TABLET | ORAL | Status: AC
  Administered 2017-12-14: 60 mg via ORAL
  Filled 2017-12-14: qty 3

## 2017-12-14 NOTE — Discharge Instructions (Signed)

## 2017-12-14 NOTE — ED Provider Notes (Signed)
[facility] Emergency Department Provider Note  ____________________________________________   First MD Initiated Contact with Patient 12/14/17 0530     (approximate)  I have reviewed the triage vital signs and the nursing notes.   HISTORY  Chief Complaint Allergic Reaction    HPI Lori Lang is a 47 y.o. female with no contributory past medical history who presents for evaluation of acute onset red raised and itching rash over her body with bowel urgency and episode of diarrhea.  She was at work and had just clean the bathroom with her usual cleaning products that she has used in the past.  She then felt urgency to have a bowel movement in which she did so she had diarrhea.  Immediately at that time she also broke out in a rash all over that she described as hives.  Symptoms were severe.  She also felt like her throat was tight although she never had any difficulty breathing, swallowing, nor speaking.  She came to the emergency department and did not take any medications.  She describes the symptoms as acute in onset and severe.  She does not know what she might of come in contact with the could have caused the symptoms and she has not had similar symptoms in the past.  She has not eaten anything new or different recently and had no contact with any other materials that were new or different.  Past Medical History:  Diagnosis Date  . Chicken pox   . GERD (gastroesophageal reflux disease)   . History of kidney stones    passed 2 , litrotrispy  . Hyperlipidemia   . Liver lesion    hemangioma vs FNH vs adenoma noted MRI 2008 (Dr. Allen Norris)   . Pneumonia   . Pre-diabetes   . Prediabetes   . Trigger finger of left thumb     Patient Active Problem List   Diagnosis Date Noted  . Bronchitis 09/02/2017  . Vitamin D deficiency 08/31/2017  . HLD (hyperlipidemia) 08/31/2017  . Prediabetes 08/31/2017  . Tobacco abuse 08/31/2017  . Polycythemia 08/31/2017  . Hot  flashes 08/25/2017  . Lumbar radiculopathy 08/25/2017    Past Surgical History:  Procedure Laterality Date  . ABDOMINAL HYSTERECTOMY     2006/2007 for endometriosis   . BACK SURGERY     L4/5 discectomy 09/18/2015 Dr. Lynann Bologna   . breast reduction     1994  . EXTRACORPOREAL SHOCK WAVE LITHOTRIPSY Right 06/07/2015   Procedure: EXTRACORPOREAL SHOCK WAVE LITHOTRIPSY (ESWL);  Surgeon: Royston Cowper, MD;  Location: [facility];  Service: Urology;  Laterality: Right;  . RADIOLOGY WITH ANESTHESIA N/A 07/19/2015   Procedure: MRI LUMBER SPIN WITHOUT;  Surgeon: Medication Radiologist, MD;  Location: [place];  Service: Radiology;  Laterality: N/A;  . RADIOLOGY WITH ANESTHESIA N/A 09/22/2017   Procedure: MRI LUMBAR SPINE WITHOUT CONTRAST;  Surgeon: Radiologist, Medication, MD;  Location: [place];  Service: Radiology;  Laterality: N/A;  . SHOULDER OPEN ROTATOR CUFF REPAIR Bilateral 2007 2008   right 2007, left 2008   . TRIGGER FINGER RELEASE Left 02/18/2016   Procedure: LEFT TRIGGER THUMB RELEASE;  Surgeon: Milly Jakob, MD;  Location: [place];  Service: Orthopedics;  Laterality: Left;  . WISDOM TOOTH EXTRACTION      Prior to Admission medications   Medication Sig Start Date End Date Taking? Authorizing Provider  albuterol (PROVENTIL HFA;VENTOLIN HFA) 108 (90 Base) MCG/ACT inhaler Inhale 1-2 puffs into the lungs every 6 (six) hours as  needed for wheezing or shortness of breath. Patient taking differently: Inhale 1 puff into the lungs every 6 (six) hours as needed for wheezing or shortness of breath.  09/02/17   McLean-Scocuzza, Nino Glow, MD  Cholecalciferol 50000 units capsule Take 1 capsule (50,000 Units total) by mouth once a week. Patient taking differently: Take 50,000 Units by mouth every Tuesday.  08/31/17   McLean-Scocuzza, Nino Glow, MD  EPINEPHrine (EPIPEN 2-PAK) 0.3 mg/0.3 mL IJ SOAJ injection Inject 0.3 mLs (0.3 mg total) into the muscle once for 1 dose. Take for severe allergic  reaction, then come immediately to the Emergency Department or call 911. 12/14/17 12/14/17  Hinda Kehr, MD  fluticasone [facility]) 50 MCG/ACT nasal spray Place 2 sprays into both nostrils daily. Patient not taking: Reported on 09/15/2017 09/08/17   Delano Metz, FNP  gabapentin (NEURONTIN) 300 MG capsule Take 1 capsule (300 mg total) by mouth at bedtime. May increase to 2x per day if tolerated Patient not taking: Reported on 09/15/2017 08/25/17   McLean-Scocuzza, Nino Glow, MD  oxyCODONE-acetaminophen (PERCOCET) 5-325 MG tablet Take 1 tablet by mouth 2 (two) times daily as needed for severe pain (low back pain). 10/06/17   McLean-Scocuzza, Nino Glow, MD  predniSONE (DELTASONE) 20 MG tablet Take 3 tablets (60 mg total) by mouth daily. 12/14/17   Hinda Kehr, MD    Allergies Patient has no known allergies.  Family History  Problem Relation Age of Onset  . Arthritis Mother   . Early death Mother   . Heart disease Mother        MVP with repair died from anestheisa   . Hyperlipidemia Mother   . Hypertension Mother   . Kidney disease Mother   . Early death Father   . Diabetes Father   . Hyperlipidemia Father   . Heart disease Father   . Hypertension Father   . Kidney disease Father   . Asthma Daughter   . Depression Daughter   . Diabetes Maternal Grandmother   . Heart disease Maternal Grandmother   . Heart disease Maternal Grandfather   . Early death Paternal Grandmother   . Diabetes Paternal Grandmother   . Cancer Paternal Grandfather        prostate     Social History Social History   Tobacco Use  . Smoking status: Current Every Day Smoker    Packs/day: 0.75    Years: 20.00    Pack years: 15.00    Types: Cigarettes  . Smokeless tobacco: Never Used  Substance Use Topics  . Alcohol use: No  . Drug use: No    Review of Systems Constitutional: No fever/chills Eyes: No visual changes. ENT: No sore throat.  Throat tightening.  No difficulty swallowing. Cardiovascular:  Denies chest pain. Respiratory: Denies shortness of breath. Gastrointestinal: No abdominal pain.  No nausea, no vomiting.  One episode of diarrhea with acute urgency. Genitourinary: Negative for dysuria. Musculoskeletal: Negative for neck pain.  Negative for back pain. Integumentary: Acute onset generalized red raised and itching rash as described above Neurological: Negative for headaches, focal weakness or numbness.   ____________________________________________   PHYSICAL EXAM:  VITAL SIGNS: ED Triage Vitals [12/14/17 0459]  Enc Vitals Group     BP 139/65     Pulse Rate 84     Resp 18     Temp 97.7 F (36.5 C)     Temp Source Oral     SpO2 98 %     Weight 88 kg (194 lb)  Height 1.651 m (5\' 5" )     Head Circumference      Peak Flow      Pain Score 0     Pain Loc      Pain Edu?      Excl. in [place]?     Constitutional: Alert and oriented. Well appearing and in no acute distress. Eyes: Conjunctivae are normal.  Head: Atraumatic. Nose: No congestion/rhinnorhea. Mouth/Throat: Mucous membranes are moist.  Nonerythematous. Neck: No stridor.  No meningeal signs.   Cardiovascular: Normal rate, regular rhythm. Good peripheral circulation. Grossly normal heart sounds. Respiratory: Normal respiratory effort.  No retractions. Lungs CTAB. Gastrointestinal: Soft and nontender. No distention.  Musculoskeletal: No lower extremity tenderness nor edema. No gross deformities of extremities. Neurologic:  Normal speech and language. No gross focal neurologic deficits are appreciated.  Skin:  Skin is warm, dry and intact.  Persistent urticarial rash primarily on the shoulders, anterior and posterior torso. Psychiatric: Mood and affect are normal. Speech and behavior are normal.  ____________________________________________   LABS (all labs ordered are listed, but only abnormal results are displayed)  Labs Reviewed - No data to  display ____________________________________________  EKG  None - EKG not ordered by ED physician  ____________________________________________  RADIOLOGY   ED MD interpretation: No indication for imaging  Official radiology report(s): No results found.  ____________________________________________   PROCEDURES  Critical Care performed: No   Procedure(s) performed:   Procedures   ____________________________________________   INITIAL IMPRESSION / ASSESSMENT AND PLAN / ED COURSE  As part of my medical decision making, I reviewed the following data within the [place] History obtained from family and Nursing notes reviewed and incorporated    Differential diagnosis includes, but is not limited to, allergic reaction, anaphylaxis,  Contact dermatitis, angioedema.  The patient still has an urticarial rash but the rest of her symptoms have improved.  I treated her with a racemic epi nebulizer, 25 mg of Benadryl by mouth, and prednisone 60 mg by mouth.  I explained to her that we are going to watch her for at least 4 hours to make sure she is not having any worsening symptoms.  Since she did not receive any intramuscular epinephrine I do not think it is absolutely critical that she stay for a full 6 hours before hours would be advisable.  The source of the allergen is unclear but she will follow-up with her primary care doctor.  I discussed with her the appropriate use of an EpiPen and I am providing prescriptions for a 2 pack of EpiPen's and prednisone.  I am transferring ED care to Dr. Jimmye Norman who will discharge her if she is asymptomatic at 9 AM.    ____________________________________________  FINAL CLINICAL IMPRESSION(S) / ED DIAGNOSES  Final diagnoses:  Allergic reaction, initial encounter     MEDICATIONS GIVEN DURING THIS VISIT:  Medications  Racepinephrine HCl 2.25 % nebulizer solution 0.5 mL (0.5 mLs Nebulization Given 12/14/17 0514)   predniSONE (DELTASONE) tablet 60 mg (60 mg Oral Given 12/14/17 0513)  diphenhydrAMINE (BENADRYL) capsule 25 mg (25 mg Oral Given 12/14/17 0514)     ED Discharge Orders         Ordered    predniSONE (DELTASONE) 20 MG tablet  Daily     12/14/17 0716    EPINEPHrine (EPIPEN 2-PAK) 0.3 mg/0.3 mL IJ SOAJ injection   Once     12/14/17 0716           Note:  This document  was prepared using Systems analyst and may include unintentional dictation errors.    Hinda Kehr, MD 12/14/17 [phone]

## 2017-12-14 NOTE — ED Provider Notes (Signed)
Patient reports she is feeling better, still has some rash although it is mild.  She is not having any difficulty speaking or swallowing.  She is stable for outpatient follow-up.   Earleen Newport, MD 12/14/17 [phone]

## 2017-12-14 NOTE — ED Triage Notes (Signed)
Patient states that about 2 hours ago she started develop hives all over. Patient states that now she is feeling like her throat and breathing feel funny.

## 2017-12-14 NOTE — ED Notes (Signed)
Dr Karma Greaser and Daiva Nakayama, RN at bedside at this time.

## 2017-12-25 ENCOUNTER — Ambulatory Visit: Payer: BLUE CROSS/BLUE SHIELD | Admitting: Internal Medicine

## 2017-12-25 DIAGNOSIS — Z0289 Encounter for other administrative examinations: Secondary | ICD-10-CM

## 2018-04-20 ENCOUNTER — Encounter: Payer: Self-pay | Admitting: Internal Medicine

## 2018-04-20 ENCOUNTER — Ambulatory Visit (INDEPENDENT_AMBULATORY_CARE_PROVIDER_SITE_OTHER): Payer: BLUE CROSS/BLUE SHIELD | Admitting: Internal Medicine

## 2018-04-20 VITALS — BP 108/70 | HR 81 | Temp 98.1°F | Ht 65.0 in | Wt 199.2 lb

## 2018-04-20 DIAGNOSIS — M545 Low back pain, unspecified: Secondary | ICD-10-CM

## 2018-04-20 DIAGNOSIS — E785 Hyperlipidemia, unspecified: Secondary | ICD-10-CM

## 2018-04-20 DIAGNOSIS — R232 Flushing: Secondary | ICD-10-CM

## 2018-04-20 DIAGNOSIS — G8929 Other chronic pain: Secondary | ICD-10-CM

## 2018-04-20 DIAGNOSIS — M5416 Radiculopathy, lumbar region: Secondary | ICD-10-CM

## 2018-04-20 DIAGNOSIS — J329 Chronic sinusitis, unspecified: Secondary | ICD-10-CM | POA: Diagnosis not present

## 2018-04-20 DIAGNOSIS — Z1231 Encounter for screening mammogram for malignant neoplasm of breast: Secondary | ICD-10-CM

## 2018-04-20 DIAGNOSIS — J42 Unspecified chronic bronchitis: Secondary | ICD-10-CM

## 2018-04-20 DIAGNOSIS — N3941 Urge incontinence: Secondary | ICD-10-CM

## 2018-04-20 DIAGNOSIS — H6690 Otitis media, unspecified, unspecified ear: Secondary | ICD-10-CM

## 2018-04-20 DIAGNOSIS — R7303 Prediabetes: Secondary | ICD-10-CM

## 2018-04-20 DIAGNOSIS — E559 Vitamin D deficiency, unspecified: Secondary | ICD-10-CM

## 2018-04-20 DIAGNOSIS — Z0000 Encounter for general adult medical examination without abnormal findings: Secondary | ICD-10-CM

## 2018-04-20 MED ORDER — ALBUTEROL SULFATE (2.5 MG/3ML) 0.083% IN NEBU
2.5000 mg | INHALATION_SOLUTION | Freq: Once | RESPIRATORY_TRACT | Status: AC
  Administered 2018-04-20: 2.5 mg via RESPIRATORY_TRACT

## 2018-04-20 MED ORDER — IPRATROPIUM BROMIDE 0.02 % IN SOLN
0.5000 mg | Freq: Once | RESPIRATORY_TRACT | Status: AC
  Administered 2018-04-20: 0.5 mg via RESPIRATORY_TRACT

## 2018-04-20 MED ORDER — AMOXICILLIN-POT CLAVULANATE 875-125 MG PO TABS: 1.0000 | ORAL_TABLET | Freq: Two times a day (BID) | ORAL | 0 refills | Status: DC

## 2018-04-20 MED ORDER — METHYLPREDNISOLONE ACETATE 40 MG/ML IJ SUSP
40.0000 mg | Freq: Once | INTRAMUSCULAR | Status: AC
  Administered 2018-04-20: 40 mg via INTRAMUSCULAR

## 2018-04-20 MED ORDER — GABAPENTIN 300 MG PO CAPS: 600.0000 mg | ORAL_CAPSULE | Freq: Every day | ORAL | 0 refills | Status: DC

## 2018-04-20 MED ORDER — OXYCODONE-ACETAMINOPHEN 7.5-325 MG PO TABS: 1.0000 | ORAL_TABLET | Freq: Two times a day (BID) | ORAL | 0 refills | Status: DC | PRN

## 2018-04-20 NOTE — Patient Instructions (Addendum)
Dr. Joselyn Arrow [facility] or Dr. Deetta Perla, Dr. Daun Peacock ([facility] [place])   D3 5000 IU daily over the counter  Call ortho Narda Amber or let me know if you want to see one of the other options above  Use albuterol inhaler   Bronchospasm, Adult  Bronchospasm is a tightening of the airways going into the lungs. During an episode, it may be harder to breathe. You may cough, and you may make a whistling sound when you breathe (wheeze). This condition often affects people with asthma. What are the causes? This condition is caused by swelling and irritation in the airways. It can be triggered by:  An infection (common).  Seasonal allergies.  An allergic reaction.  Exercise.  Irritants. These include pollution, cigarette smoke, strong odors, aerosol sprays, and paint fumes.  Weather changes. Winds increase molds and pollens in the air. Cold air may cause swelling.  Stress and emotional upset. What are the signs or symptoms? Symptoms of this condition include:  Wheezing. If the episode was triggered by an allergy, wheezing may start right away or hours later.  Nighttime coughing.  Frequent or severe coughing with a simple cold.  Chest tightness.  Shortness of breath.  Decreased ability to exercise. How is this diagnosed? This condition is usually diagnosed with a review of your medical history and a physical exam. Tests, such as lung function tests, are sometimes done to look for other conditions. The need for a chest X-ray depends on where the wheezing occurs and whether it is the first time you have wheezed. How is this treated? This condition may be treated with:  Inhaled medicines. These open up the airways and help you breathe. They can be taken with an inhaler or a nebulizer device.  Corticosteroid medicines. These may be given for severe bronchospasm, usually when it is associated with asthma.  Avoiding triggers, such as irritants, infection, or  allergies. Follow these instructions at home: Medicines  Take over-the-counter and prescription medicines only as told by your health care provider.  If you need to use an inhaler or nebulizer to take your medicine, ask your health care provider to explain how to use it correctly. If you were given a spacer, always use it with your inhaler. Lifestyle  Reduce the number of triggers in your home. To do this: ? Change your heating and air conditioning filter at least once a month. ? Limit your use of fireplaces and wood stoves. ? Do not smoke. Do not allow smoking in your home. ? Avoid using perfumes and fragrances. ? Get rid of pests, such as roaches and mice, and their droppings. ? Remove any mold from your home. ? Keep your house clean and dust free. Use unscented cleaning products. ? Replace carpet with wood, tile, or vinyl flooring. Carpet can trap dander and dust. ? Use allergy-proof pillows, mattress covers, and box spring covers. ? Wash bed sheets and blankets every week in hot water. Dry them in a dryer. ? Use blankets that are made of polyester or cotton. ? Wash your hands often. ? Do not allow pets in your bedroom.  Avoid breathing in cold air when you exercise. General instructions  Have a plan for seeking medical care. Know when to call your health care provider and local emergency services, and where to get emergency care.  Stay up to date on your immunizations.  When you have an episode of bronchospasm, stay calm. Try to relax and breathe more slowly.  If you  have asthma, make sure you have an asthma action plan.  Keep all follow-up visits as told by your health care provider. This is important. Contact a health care provider if:  You have muscle aches.  You have chest pain.  The mucus that you cough up (sputum) changes from clear or white to yellow, green, gray, or bloody.  You have a fever.  Your sputum gets thicker. Get help right away if:  Your  wheezing and coughing get worse, even after you take your prescribed medicines.  It gets even harder to breathe.  You develop severe chest pain. Summary  Bronchospasm is a tightening of the airways going into the lungs.  During an episode of bronchospasm, you may have a harder time breathing. You may cough and make a whistling sound when you breathe (wheeze).  Avoid exposure to triggers such as smoke, dust, mold, animal dander, and fragrances.  When you have an episode of bronchospasm, stay calm. Try to relax and breathe more slowly. This information is not intended to replace advice given to you by your health care provider. Make sure you discuss any questions you have with your health care provider. Document Released: 03/13/2003 Document Revised: 03/06/2016 Document Reviewed: 03/06/2016 Elsevier Interactive Patient Education  2019 [employer]    Vitamin D Deficiency Vitamin D deficiency is when your body does not have enough vitamin D. Vitamin D is important to your body for many reasons:  It helps the body to absorb two important minerals, called calcium and phosphorus.  It plays a role in bone health.  It may help to prevent some diseases, such as diabetes and multiple sclerosis.  It plays a role in muscle function, including heart function. You can get vitamin D by:  Eating foods that naturally contain vitamin D.  Eating or drinking milk or other dairy products that have vitamin D added to them.  Taking a vitamin D supplement or a multivitamin supplement that contains vitamin D.  Being in the sun. Your body naturally makes vitamin D when your skin is exposed to sunlight. Your body changes the sunlight into a form of the vitamin that the body can use. If vitamin D deficiency is severe, it can cause a condition in which your bones become soft. In adults, this condition is called osteomalacia. In children, this condition is called rickets. What are the causes? Vitamin D  deficiency may be caused by:  Not eating enough foods that contain vitamin D.  Not getting enough sun exposure.  Having certain digestive system diseases that make it difficult for your body to absorb vitamin D. These diseases include Crohn disease, chronic pancreatitis, and cystic fibrosis.  Having a surgery in which a part of the stomach or a part of the small intestine is removed.  Being obese.  Having chronic kidney disease or liver disease. What increases the risk? This condition is more likely to develop in:  Older people.  People who do not spend much time outdoors.  People who live in a long-term care facility.  People who have had broken bones.  People with weak or thin bones (osteoporosis).  People who have a disease or condition that changes how the body absorbs vitamin D.  People who have dark skin.  People who take certain medicines, such as steroid medicines or certain seizure medicines.  People who are overweight or obese. What are the signs or symptoms? In mild cases of vitamin D deficiency, there may not be any symptoms. If the  condition is severe, symptoms may include:  Bone pain.  Muscle pain.  Falling often.  Broken bones caused by a minor injury. How is this diagnosed? This condition is usually diagnosed with a blood test. How is this treated? Treatment for this condition may depend on what caused the condition. Treatment options include:  Taking vitamin D supplements.  Taking a calcium supplement. Your health care provider will suggest what dose is best for you. Follow these instructions at home:  Take medicines and supplements only as told by your health care provider.  Eat foods that contain vitamin D. Choices include: ? Fortified dairy products, cereals, or juices. Fortified means that vitamin D has been added to the food. Check the label on the package to be sure. ? Fatty fish, such as salmon or trout. ? Eggs. ? Oysters.  Do not  use a tanning bed.  Maintain a healthy weight. Lose weight, if needed.  Keep all follow-up visits as told by your health care provider. This is important. Contact a health care provider if:  Your symptoms do not go away.  You feel like throwing up (nausea) or you throw up (vomit).  You have fewer bowel movements than usual or it is difficult for you to have a bowel movement (constipation). This information is not intended to replace advice given to you by your health care provider. Make sure you discuss any questions you have with your health care provider. Document Released: 06/02/2011 Document Revised: 08/22/2015 Document Reviewed: 07/26/2014 Elsevier Interactive Patient Education  2019 [employer].

## 2018-04-21 ENCOUNTER — Encounter: Payer: Self-pay | Admitting: Internal Medicine

## 2018-04-21 LAB — URINALYSIS, ROUTINE W REFLEX MICROSCOPIC
Bilirubin Urine: NEGATIVE
GLUCOSE, UA: NEGATIVE
HGB URINE DIPSTICK: NEGATIVE
LEUKOCYTES UA: NEGATIVE
NITRITE: NEGATIVE
PH: 7 (ref 5.0–8.0)
Protein, ur: NEGATIVE
SPECIFIC GRAVITY, URINE: 1.024 (ref 1.001–1.03)

## 2018-04-21 LAB — URINE CULTURE
MICRO NUMBER:: 115604
RESULT: NO GROWTH
SPECIMEN QUALITY: ADEQUATE

## 2018-04-21 NOTE — Progress Notes (Signed)
Chief Complaint  Patient presents with  . Annual Exam   F/u with husband  1. HLD will recheck labs upcoming  2. Chronic pain back with S1 b/l irritation per MRI 09/2017 pain runs down left >right leg and surgery is needed but pt does not know when will do it established with ortho [place] now and saw PA-C Tanner last visit. Lori Lang reports 10/10 pain today and requests pain medication  3. C/o right lower back pain and wants urine checked today to r/o kidneys  4. C/o cough with right upper chest pain. Lori Lang is still smoking and not ready to quit. Lori Lang also c/o left>right ear pain today    Review of Systems  Constitutional: Negative for weight loss.  HENT: Negative for hearing loss.   Eyes: Negative for blurred vision.  Respiratory: Positive for cough. Negative for shortness of breath and wheezing.   Cardiovascular: Positive for chest pain.  Gastrointestinal: Negative for abdominal pain.  Musculoskeletal: Positive for back pain.  Skin: Negative for rash.  Neurological: Negative for headaches.  Psychiatric/Behavioral: Negative for depression.   Past Medical History:  Diagnosis Date  . Chicken pox   . GERD (gastroesophageal reflux disease)   . History of kidney stones    passed 2 , litrotrispy  . Hyperlipidemia   . Liver lesion    hemangioma vs FNH vs adenoma noted MRI 2008 (Dr. Allen Norris)   . Pneumonia   . Pre-diabetes   . Prediabetes   . Trigger finger of left thumb    Past Surgical History:  Procedure Laterality Date  . ABDOMINAL HYSTERECTOMY     2006/2007 for endometriosis   . BACK SURGERY     L4/5 discectomy 09/18/2015 Dr. Lynann Bologna   . breast reduction     1994  . EXTRACORPOREAL SHOCK WAVE LITHOTRIPSY Right 06/07/2015   Procedure: EXTRACORPOREAL SHOCK WAVE LITHOTRIPSY (ESWL);  Surgeon: Royston Cowper, MD;  Location: [facility];  Service: Urology;  Laterality: Right;  . RADIOLOGY WITH ANESTHESIA N/A 07/19/2015   Procedure: MRI LUMBER SPIN WITHOUT;  Surgeon: Medication Radiologist, MD;   Location: [place];  Service: Radiology;  Laterality: N/A;  . RADIOLOGY WITH ANESTHESIA N/A 09/22/2017   Procedure: MRI LUMBAR SPINE WITHOUT CONTRAST;  Surgeon: Radiologist, Medication, MD;  Location: [place];  Service: Radiology;  Laterality: N/A;  . SHOULDER OPEN ROTATOR CUFF REPAIR Bilateral 2007 2008   right 2007, left 2008   . TRIGGER FINGER RELEASE Left 02/18/2016   Procedure: LEFT TRIGGER THUMB RELEASE;  Surgeon: Milly Jakob, MD;  Location: [place];  Service: Orthopedics;  Laterality: Left;  . WISDOM TOOTH EXTRACTION     Family History  Problem Relation Age of Onset  . Arthritis Mother   . Early death Mother   . Heart disease Mother        MVP with repair died from anestheisa   . Hyperlipidemia Mother   . Hypertension Mother   . Kidney disease Mother   . Early death Father   . Diabetes Father   . Hyperlipidemia Father   . Heart disease Father   . Hypertension Father   . Kidney disease Father   . Asthma Daughter   . Depression Daughter   . Diabetes Maternal Grandmother   . Heart disease Maternal Grandmother   . Heart disease Maternal Grandfather   . Early death Paternal Grandmother   . Diabetes Paternal Grandmother   . Cancer Paternal Grandfather        prostate    Social History  Socioeconomic History  . Marital status: Married    Spouse name: Not on file  . Number of children: Not on file  . Years of education: Not on file  . Highest education level: Not on file  Occupational History  . Not on file  Social Needs  . Financial resource strain: Not on file  . Food insecurity:    Worry: Not on file    Inability: Not on file  . Transportation needs:    Medical: Not on file    Non-medical: Not on file  Tobacco Use  . Smoking status: Current Every Day Smoker    Packs/day: 0.75    Years: 20.00    Pack years: 15.00    Types: Cigarettes  . Smokeless tobacco: Never Used  Substance and Sexual Activity  . Alcohol use: No  . Drug use: No  .  Sexual activity: Yes  Lifestyle  . Physical activity:    Days per week: Not on file    Minutes per session: Not on file  . Stress: Not on file  Relationships  . Social connections:    Talks on phone: Not on file    Gets together: Not on file    Attends religious service: Not on file    Active member of club or organization: Not on file    Attends meetings of clubs or organizations: Not on file    Relationship status: Not on file  . Intimate partner violence:    Fear of current or ex partner: Not on file    Emotionally abused: Not on file    Physically abused: Not on file    Forced sexual activity: Not on file  Other Topics Concern  . Not on file  Social History Narrative   Married    2 kids son going to army   Daughter going to [employer] fall 2019    Owns guns    Wears seat belt    Safe in relationship    Current Meds  Medication Sig  . gabapentin (NEURONTIN) 300 MG capsule Take 2 capsules (600 mg total) by mouth at bedtime. May increase to 2x per day if tolerated  . [DISCONTINUED] gabapentin (NEURONTIN) 300 MG capsule Take 1 capsule (300 mg total) by mouth at bedtime. May increase to 2x per day if tolerated  . [DISCONTINUED] oxyCODONE-acetaminophen (PERCOCET) 5-325 MG tablet Take 1 tablet by mouth 2 (two) times daily as needed for severe pain (low back pain).   No Known Allergies Recent Results (from the past 2160 hour(s))  Urinalysis, Routine w reflex microscopic     Status: Abnormal   Collection Time: 04/20/18  2:14 PM  Result Value Ref Range   Color, Urine YELLOW YELLOW   APPearance CLEAR CLEAR   Specific Gravity, Urine 1.024 1.001 - 1.03   pH 7.0 5.0 - 8.0   Glucose, UA NEGATIVE NEGATIVE   Bilirubin Urine NEGATIVE NEGATIVE   Ketones, ur TRACE (A) NEGATIVE   Hgb urine dipstick NEGATIVE NEGATIVE   Protein, ur NEGATIVE NEGATIVE   Nitrite NEGATIVE NEGATIVE   Leukocytes, UA NEGATIVE NEGATIVE   Objective  Body mass index is 33.15 kg/m. Wt Readings from Last 3  Encounters:  04/20/18 199 lb 3.2 oz (90.4 kg)  12/14/17 194 lb (88 kg)  09/08/17 196 lb 8 oz (89.1 kg)   Temp Readings from Last 3 Encounters:  04/20/18 98.1 F (36.7 C) (Oral)  12/14/17 97.7 F (36.5 C) (Oral)  09/22/17 (!) 97.5 F (36.4 C)  BP Readings from Last 3 Encounters:  04/20/18 108/70  12/14/17 124/78  09/22/17 123/77   Pulse Readings from Last 3 Encounters:  04/20/18 81  12/14/17 90  09/22/17 64    Physical Exam Vitals signs and nursing note reviewed.  Constitutional:      Appearance: Normal appearance. Lori Lang is well-developed and well-groomed. Lori Lang is obese.  HENT:     Head: Normocephalic and atraumatic.     Comments: Redness in b/l ears      Nose: Nose normal.     Mouth/Throat:     Mouth: Mucous membranes are moist.     Pharynx: Oropharynx is clear.  Eyes:     Conjunctiva/sclera: Conjunctivae normal.     Pupils: Pupils are equal, round, and reactive to light.  Cardiovascular:     Rate and Rhythm: Normal rate and regular rhythm.     Heart sounds: Normal heart sounds.  Pulmonary:     Effort: Pulmonary effort is normal.     Breath sounds: Rhonchi present.     Comments: Right lung rhonchi   Skin:    General: Skin is warm and dry.  Neurological:     General: No focal deficit present.     Mental Status: Lori Lang is alert and oriented to person, place, and time. Mental status is at baseline.     Gait: Gait normal.  Psychiatric:        Attention and Perception: Attention and perception normal.        Mood and Affect: Mood and affect normal.        Speech: Speech normal.        Behavior: Behavior normal. Behavior is cooperative.        Thought Content: Thought content normal.        Cognition and Memory: Cognition and memory normal.        Judgment: Judgment normal.     Assessment   1. Chronic bronchitis likely 2/2 smoking with exacerbation and OM/sinusitis  2. HLD  3. Chronic back pain abnormal MRI L spine 09/2017 also r/o kidney stones/UTI  As cause  of right back pain and urge incontinence  4. HM Plan   1. duoneb x 1, depomedrol 40 x 1  Augmentin  Percocet 5 day supply increase to 7.5 5 mg did not help Increase gabapentin 300 to 600 mg qhs  rec smoking cessation  2. Check fasting labs  3. F/u orthocarolina also disc [facility] Dr. Donalee Citrin vs [facility] Dr. Joselyn Arrow  4.  Never flu shot  Tdap ? Last with Dr. Clemmie Krill check [facility]  MMR immune and hep B immune   Referred mammo today, get copy [facility]  Get copy prior [facility] s/p hysterectomy in 2006/2007 for endometriosis  -consider pap in future at f/u h/o abnormal pap sch fasting labs  rec smoknig cessation smoker since age 83 y.o max 1 ppd now < 1ppd   Get records [facility], Dr. Clemmie Krill ,[facility]  [facility]  Dentist Dr. Bronson Curb  Provider: Dr. Olivia Mackie McLean-Scocuzza-Internal Medicine

## 2018-04-21 NOTE — Progress Notes (Signed)
Patient not in [facility]. 

## 2018-04-23 ENCOUNTER — Other Ambulatory Visit (INDEPENDENT_AMBULATORY_CARE_PROVIDER_SITE_OTHER): Payer: BLUE CROSS/BLUE SHIELD

## 2018-04-23 ENCOUNTER — Encounter: Payer: Self-pay | Admitting: Internal Medicine

## 2018-04-23 ENCOUNTER — Other Ambulatory Visit: Payer: Self-pay | Admitting: Internal Medicine

## 2018-04-23 DIAGNOSIS — E559 Vitamin D deficiency, unspecified: Secondary | ICD-10-CM

## 2018-04-23 DIAGNOSIS — M5416 Radiculopathy, lumbar region: Secondary | ICD-10-CM

## 2018-04-23 DIAGNOSIS — R232 Flushing: Secondary | ICD-10-CM | POA: Diagnosis not present

## 2018-04-23 DIAGNOSIS — Z0000 Encounter for general adult medical examination without abnormal findings: Secondary | ICD-10-CM

## 2018-04-23 DIAGNOSIS — E785 Hyperlipidemia, unspecified: Secondary | ICD-10-CM | POA: Diagnosis not present

## 2018-04-23 DIAGNOSIS — M545 Low back pain: Secondary | ICD-10-CM | POA: Diagnosis not present

## 2018-04-23 DIAGNOSIS — R7303 Prediabetes: Secondary | ICD-10-CM | POA: Diagnosis not present

## 2018-04-23 DIAGNOSIS — G8929 Other chronic pain: Secondary | ICD-10-CM

## 2018-04-23 DIAGNOSIS — J42 Unspecified chronic bronchitis: Secondary | ICD-10-CM

## 2018-04-23 LAB — CBC WITH DIFFERENTIAL/PLATELET
BASOS ABS: 0.1 10*3/uL (ref 0.0–0.1)
Basophils Relative: 0.9 % (ref 0.0–3.0)
Eosinophils Absolute: 0.1 10*3/uL (ref 0.0–0.7)
Eosinophils Relative: 1.8 % (ref 0.0–5.0)
HCT: 45.5 % (ref 36.0–46.0)
Hemoglobin: 15.2 g/dL — ABNORMAL HIGH (ref 12.0–15.0)
LYMPHS ABS: 2.9 10*3/uL (ref 0.7–4.0)
Lymphocytes Relative: 36.3 % (ref 12.0–46.0)
MCHC: 33.4 g/dL (ref 30.0–36.0)
MCV: 89.8 fl (ref 78.0–100.0)
Monocytes Absolute: 0.8 10*3/uL (ref 0.1–1.0)
Monocytes Relative: 9.8 % (ref 3.0–12.0)
NEUTROS ABS: 4 10*3/uL (ref 1.4–7.7)
NEUTROS PCT: 51.2 % (ref 43.0–77.0)
PLATELETS: 326 10*3/uL (ref 150.0–400.0)
RBC: 5.07 Mil/uL (ref 3.87–5.11)
RDW: 14.5 % (ref 11.5–15.5)
WBC: 7.9 10*3/uL (ref 4.0–10.5)

## 2018-04-23 LAB — COMPREHENSIVE METABOLIC PANEL
ALK PHOS: 86 U/L (ref 39–117)
ALT: 18 U/L (ref 0–35)
AST: 15 U/L (ref 0–37)
Albumin: 4 g/dL (ref 3.5–5.2)
BUN: 14 mg/dL (ref 6–23)
CO2: 27 mEq/L (ref 19–32)
CREATININE: 0.69 mg/dL (ref 0.40–1.20)
Calcium: 9.3 mg/dL (ref 8.4–10.5)
Chloride: 103 mEq/L (ref 96–112)
GFR: 109.34 mL/min (ref >60.00)
Glucose, Bld: 89 mg/dL (ref 70–99)
POTASSIUM: 3.8 meq/L (ref 3.5–5.1)
Sodium: 138 mEq/L (ref 135–145)
Total Bilirubin: 0.4 mg/dL (ref 0.2–1.2)
Total Protein: 7.3 g/dL (ref 6.0–8.3)

## 2018-04-23 LAB — VITAMIN D 25 HYDROXY (VIT D DEFICIENCY, FRACTURES): VITD: 15.62 ng/mL — ABNORMAL LOW (ref 30.00–100.00)

## 2018-04-23 LAB — HEMOGLOBIN A1C: HEMOGLOBIN A1C: 6.8 % — AB (ref 4.6–6.5)

## 2018-04-23 LAB — LIPID PANEL
CHOL/HDL RATIO: 5
Cholesterol: 191 mg/dL (ref 0–200)
HDL: 37.1 mg/dL — AB (ref >39.00)
LDL CALC: 129 mg/dL — AB (ref 0–99)
NonHDL: 154.3
TRIGLYCERIDES: 128 mg/dL (ref 0.0–149.0)
VLDL: 25.6 mg/dL (ref 0.0–40.0)

## 2018-04-23 MED ORDER — CHOLECALCIFEROL 1.25 MG (50000 UT) PO CAPS: 50000.0000 [IU] | ORAL_CAPSULE | ORAL | 1 refills | Status: DC

## 2018-04-26 ENCOUNTER — Encounter: Payer: Self-pay | Admitting: Internal Medicine

## 2018-05-25 ENCOUNTER — Encounter: Payer: Self-pay | Admitting: Internal Medicine

## 2018-05-26 NOTE — Telephone Encounter (Signed)
Sent to PCP I do not see that a referral was placed for this please advise.

## 2018-06-22 ENCOUNTER — Telehealth (INDEPENDENT_AMBULATORY_CARE_PROVIDER_SITE_OTHER): Payer: BLUE CROSS/BLUE SHIELD | Admitting: Internal Medicine

## 2018-06-22 ENCOUNTER — Other Ambulatory Visit: Payer: Self-pay

## 2018-06-22 ENCOUNTER — Encounter: Payer: Self-pay | Admitting: Internal Medicine

## 2018-06-22 ENCOUNTER — Ambulatory Visit (INDEPENDENT_AMBULATORY_CARE_PROVIDER_SITE_OTHER): Payer: BLUE CROSS/BLUE SHIELD | Admitting: Internal Medicine

## 2018-06-22 DIAGNOSIS — M545 Low back pain, unspecified: Secondary | ICD-10-CM

## 2018-06-22 DIAGNOSIS — R252 Cramp and spasm: Secondary | ICD-10-CM | POA: Diagnosis not present

## 2018-06-22 DIAGNOSIS — M5126 Other intervertebral disc displacement, lumbar region: Secondary | ICD-10-CM | POA: Diagnosis not present

## 2018-06-22 DIAGNOSIS — Z87440 Personal history of urinary (tract) infections: Secondary | ICD-10-CM

## 2018-06-22 DIAGNOSIS — E119 Type 2 diabetes mellitus without complications: Secondary | ICD-10-CM | POA: Diagnosis not present

## 2018-06-22 DIAGNOSIS — F17200 Nicotine dependence, unspecified, uncomplicated: Secondary | ICD-10-CM

## 2018-06-22 DIAGNOSIS — M5442 Lumbago with sciatica, left side: Secondary | ICD-10-CM

## 2018-06-22 DIAGNOSIS — G8929 Other chronic pain: Secondary | ICD-10-CM

## 2018-06-22 DIAGNOSIS — G4762 Sleep related leg cramps: Secondary | ICD-10-CM | POA: Insufficient documentation

## 2018-06-22 DIAGNOSIS — E785 Hyperlipidemia, unspecified: Secondary | ICD-10-CM

## 2018-06-22 DIAGNOSIS — M5416 Radiculopathy, lumbar region: Secondary | ICD-10-CM

## 2018-06-22 DIAGNOSIS — R232 Flushing: Secondary | ICD-10-CM

## 2018-06-22 DIAGNOSIS — M5441 Lumbago with sciatica, right side: Secondary | ICD-10-CM

## 2018-06-22 DIAGNOSIS — Z720 Tobacco use: Secondary | ICD-10-CM

## 2018-06-22 MED ORDER — GABAPENTIN 300 MG PO CAPS: ORAL_CAPSULE | ORAL | 3 refills | Status: DC

## 2018-06-22 NOTE — Telephone Encounter (Signed)
telephone Visit/telephone Note  I connected with Lori Lang on 905 amat  by a phoneenabled telemedicine application and verified that I am speaking with the correct person using two identifiers.  Location patient: home Location provider:work  Persons participating in the virtual visit: patient, provider, pts husband Lori Lang   I discussed the limitations of evaluation and management by telemedicine and the availability of in person appointments. The patient expressed understanding and agreed to proceed.   HPI: 1. Chronic low back pain s/p lumbar left laminotomy for discectomy L5-S1 with numbness,tingling and burning left sided worse but happens both sides and left buttock on time son fire MRI 7/20109 with left disc protrusion and could effect left and right S1. She wants to see Dr. Deetta Perla [facility] and be referred to the pain clinic for chronic pain meds. Pain is 10/10 worse in the am. She has had a steroid injection x 1 in the past and PT but it only helped for 2 weeks and sx's back after 1 month. She did not think PT helped.  Gabapentin 600 mg at night helps some days -She also would like referral to pain clinic   2. C/o leg cramps   3. 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 DM 2 A1C 6.8 03/2018 she declines medications for now and reports eating pasta which she enjoys and eating chocholate daily. She likes to drink Sprite soda as well. Was wondering if numbness and tingling in feet had to do with dx of diabetes (I.e neuropathy) explained its possible but more than likely due to chronic back issues.  -disc Zevia soda instead of sprite   5. Resolved URI 3 weeks ago no fever had dry cough, fatigue and body aches she thinks she had the flu and did not get the flu shot this year. Her husband did not get sick but her son has similar sxs which are now resolved    ROS: See pertinent positives and negatives per  HPI.  Past Medical History:  Diagnosis Date  . Chicken pox   . GERD (gastroesophageal reflux disease)   . History of kidney stones    passed 2 , litrotrispy  . Hyperlipidemia   . Liver lesion    hemangioma vs FNH vs adenoma noted MRI 2008 (Dr. Allen Norris)   . Pneumonia   . Pre-diabetes   . Prediabetes   . Trigger finger of left thumb     Past Surgical History:  Procedure Laterality Date  . ABDOMINAL HYSTERECTOMY     2006/2007 for endometriosis   . BACK SURGERY     L4/5 discectomy 09/18/2015 Dr. Lynann Bologna   . breast reduction     1994  . EXTRACORPOREAL SHOCK WAVE LITHOTRIPSY Right 06/07/2015   Procedure: EXTRACORPOREAL SHOCK WAVE LITHOTRIPSY (ESWL);  Surgeon: Royston Cowper, MD;  Location: [facility];  Service: Urology;  Laterality: Right;  . RADIOLOGY WITH ANESTHESIA N/A 07/19/2015   Procedure: MRI LUMBER SPIN WITHOUT;  Surgeon: Medication Radiologist, MD;  Location: [place];  Service: Radiology;  Laterality: N/A;  . RADIOLOGY WITH ANESTHESIA N/A 09/22/2017   Procedure: MRI LUMBAR SPINE WITHOUT CONTRAST;  Surgeon: Radiologist, Medication, MD;  Location: [place];  Service: Radiology;  Laterality: N/A;  . SHOULDER OPEN ROTATOR CUFF REPAIR Bilateral 2007 2008   right 2007, left 2008   . TRIGGER FINGER RELEASE Left 02/18/2016   Procedure: LEFT TRIGGER THUMB RELEASE;  Surgeon: Milly Jakob, MD;  Location: [place];  Service: Orthopedics;  Laterality: Left;  . WISDOM TOOTH EXTRACTION      Family History  Problem Relation Age of Onset  . Arthritis Mother   . Early death Mother   . Heart disease Mother        MVP with repair died from anestheisa   . Hyperlipidemia Mother   . Hypertension Mother   . Kidney disease Mother   . Early death Father   . Diabetes Father   . Hyperlipidemia Father   . Heart disease Father   . Hypertension Father   . Kidney disease Father   . Asthma Daughter   . Depression Daughter   . Diabetes Maternal Grandmother   . Heart disease Maternal  Grandmother   . Heart disease Maternal Grandfather   . Early death Paternal Grandmother   . Diabetes Paternal Grandmother   . 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  Cholecalciferol 1.25 MG (50000 UT) capsule, Take 1 capsule (50,000 Units total) by mouth once a week., Disp: 13 capsule, Rfl: 1 .  gabapentin (NEURONTIN) 300 MG capsule, Take 2 capsules (600 mg total) by mouth at bedtime. May increase to 2x per day if tolerated, Disp: 180 capsule, Rfl: 0  EXAM: limited this was telephone visit not video visit   VITALS per patient if applicable: PSYCH/NEURO: pleasant and cooperative, no obvious depression or anxiety, speech and thought processing grossly intact  ASSESSMENT AND PLAN:  Discussed the following assessment and plan:  1. Lumbar radiculopathy with chronic low back pain with sciatica present b/l though L>R -refer to pain clinic [facility] and Dr. Deetta Perla NS for 2nd opinion  -will not be able to refill chronic narcotics long term pain clinic referral increase gabapentin for now  -prev steroid injection and PT w/o relief -increase gabapentin 600 mg at night to 300 mg 2x per day (breakfast or lunch or 2 pills at once) and 600 mg at night  2. C/o leg cramps  -rec increase water intake trial of increase gabapentin   3. Nicotine dependence/tobacco abuse -rec smoking cessation   4. DM 2 A1C 6.8 04/23/2018/HLD -declines medications  -rec healthy diet choices and exercise - numbness and tingling in feet more than likely due to chronic back issues not diabetes  -on gabapentin as above  -disc Zevia soda instead of sprite sugar free   5. Resolved URI  -ntd    Also will advise pt to schedule mammogram in the future order is in   I discussed the assessment and treatment plan with the patient. The patient was provided an opportunity to ask questions and all  were answered. The patient agreed with the plan and demonstrated an understanding of the instructions.   The patient was advised to call back or seek an in-person evaluation if the symptoms worsen or if the condition fails to improve as anticipated.  I provided 20 minutes of non-face-to-face time during this encounter.   Nino Glow McLean-Scocuzza, MD

## 2018-06-30 ENCOUNTER — Telehealth: Payer: Self-pay | Admitting: Internal Medicine

## 2018-06-30 NOTE — Telephone Encounter (Signed)
sch f/u 11/2018  Same day as husband   Thanks [place]

## 2018-07-13 NOTE — Progress Notes (Signed)
Patient's Name: Lori Lang  MRN: [id number]  Referring Provider: Orland Mustard *  DOB: Dec 10, 1970  PCP: McLean-Scocuzza, Nino Glow, MD  DOS: 07/14/2018  Note by: Gaspar Cola, MD  Service setting: Ambulatory outpatient  Specialty: Interventional Pain Management  Location: [facility] Virtual Visit  Visit type: Initial Patient Evaluation  Patient type: New Patient   Pain Management Virtual Encounter Note - Virtual Visit via Telephone Telehealth (real-time audio visits between healthcare provider and patient).  Patient's Phone No.:  [phone] (home); [phone] (mobile); (Preferred) [phone] [email or web address]  [id number] [place] [place] - [address] [id number]: [phone]: [phone]  Pre-screening note:  Our staff contacted Ms. LRaczand offered her an "in person", "face-to-face" appointment versus a telephone encounter. She indicated preferring the telephone encounter, at this time.  Primary Reason(s) for Visit: Tele-Encounter for initial evaluation of one or more chronic problems (new to examiner) potentially causing chronic pain, and posing a threat to normal musculoskeletal function. (Level of risk: High) CC: Back Pain (low left)  I contacted KDISA RIEDLINGERon 07/14/2018 at 11:20 AM via telephone and clearly identified myself as FGaspar Cola MD. I verified that I was speaking with the correct person using two identifiers (Name and date of birth: [birth year].  Advanced Informed Consent I sought verbal advanced consent from Lori Shyfor virtual visit interactions. I informed Lori Lang, risks, and limitations associated with providing "not-in-person" medical evaluation and management services. I also informed [name] Finally, I informed her that there would be a charge for the virtual visit and  that she could be  personally, fully or partially, financially responsible for it. Lori Lang and agreed to proceed.   HPI  [name] a 48y.o. year old, female patient, contacted today for an initial evaluation of her chronic pain. She has Hot flashes; Lumbosacral radiculopathy (S1) (Left); Vitamin D deficiency; HLD (hyperlipidemia); DM2 (diabetes mellitus, type 2) ([place]; Tobacco abuse; Polycythemia; Nocturnal leg cramps (Bilateral); Sciatica (Left); Chronic pain syndrome; Pharmacologic therapy; Disorder of skeletal system; Problems influencing health status; Failed back surgical syndrome; Chronic low back pain (Secondary Area of Pain) (Left) w/ sciatica (Left); Chronic lower extremity pain (Primary area of Pain) (Left); Lumbar facet arthropathy; DDD (degenerative disc disease), lumbosacral; Leg cramps (Bilateral); Displacement of lumbar intervertebral disc (L5-S1) w/ radiculopathy (S1) (Left); Lumbar spondylosis; Chronic musculoskeletal pain; Neurogenic pain; and Abnormal MRI, lumbar spine on their problem list.  Pain Assessment: Location: Lower, Left Back Radiating: radiated down back of left leg to sole of foot Onset: More than a month ago Duration: Chronic pain Quality: Throbbing, Aching, Heaviness, Stabbing Severity: 10-Worst pain ever/10 (subjective, self-reported pain score)  Effect on ADL: " I cant get up in the morning" Timing: Intermittent Modifying factors: medications, standing   Onset and Duration: Sudden, Date of onset: 2003 and Present longer than 3 months Cause of pain: fell in 2003 Severity: Getting worse, NAS-11 at its worse: 10/10, NAS-11 at its best: 2/10, NAS-11 now: 5/10 and NAS-11 on the average: 5/10 Timing: Morning and Night Aggravating Factors: Bending, Kneeling, Lifiting, Squatting, Stooping , Twisting and Walking Alleviating Factors: Medications and Standing Associated Problems: Day-time cramps, Night-time cramps, Numbness, Pain that  wakes patient up and Pain that does not allow patient to sleep Quality of Pain: Aching, Intermittent, Heavy, Stabbing and Throbbing Previous Examinations or Tests: MRI scan,  X-rays, Neurosurgical evaluation, Orthopedic evaluation and Chiropractic evaluation Previous Treatments: Epidural steroid injections and Narcotic medications  According to the patient her primary area of pain is that of the left lower extremity where the pain goals all the way down into the bottom of the foot and smaller toes on the lateral aspect of the foot, traveling to the back of the leg.  When asked to perform toe walking and/or standing she indicated having difficulty doing that with the left leg compared to the right.  The patient was asked if this was secondary to pain or weakness and she indicated that it was secondary to weakness of the leg.  The patient denies experiencing any type of pain on the right lower extremity, but does admit to bilateral nocturnal cramps in the area of her calves and occasionally the thighs.  She denies any trauma to the lower extremities, any surgical procedures in the legs, injections or x-rays of the lower extremity.  She does admit having had some back surgery done in June 2017 by Dr. Lynann Bologna at [facility] for orthopedics.  The patient indicates no postoperative complications and she also indicates that this completely eliminated her lower extremity pain for over a year.  After about a year, she began to experience a gradual onset and recurrence of the pain.  This pain is been described by the patient as being identical in terms of its location, to the pain that she had before the surgery.  During the year that she spent without any pain, she indicates not having been taking any pain medicine.  As of now, the patient indicates that her leg pain pattern is identical to the one she had before her surgery.  The only difference is at this time this is hurting more than before the surgery.  The patient  indicates having had physical therapy after the surgery which did seem to help at the time.  In terms of this leg pain she indicates that the worst part is in the left buttocks area, followed by the back of her thigh in the hamstring area, followed by the calf region.  The patient's secondary area of pain is that of the lower back on the left side with some occasional involvement of the midline.  She denies any pain on the right side of the lower back or the right leg.  When comparing the lower extremity pain in the low back pain she describes the left lower extremity pain to be a lot worse than the low back pain.  As previously stated the patient underwent a lumbar hemilaminectomy on the left side around June 2017, by Dr. Lynann Bologna.  She also admits to having had 1 lumbar epidural steroid injection done at [facility] and her him, before her back surgery.  She describes having had complete relief of the pain for the duration of the local anesthetic, followed by recurrence of the pain and then a second wave of relief 2 weeks later which lasted for approximately a month.  However, when she went back to [facility] to describe the results of her epidural, she mentioned that the pain was still there in the leg in that it did not help.  Apparently they took this at face value and sent the patient to have a back surgery.  Currently the patient describes managing her pain with gabapentin 300 mg 2 tablets p.o. at bedtime.  She describes no side effects or problems with the use of the gabapentin.  Today I took  the time to review the patient's last lumbar MRI as well as her lab work.  She has evidence of a vitamin D deficiency, which to a certain extent could be contributing to her inability to sleep at night.  She describes that her insomnia is secondary to her pain.  The patient was informed that my practice is divided into two sections: an interventional pain management section, as well as a  completely separate and distinct medication management section. I explained that I have procedure days for my interventional therapies, and evaluation days for follow-ups and medication management. Because of the amount of documentation required during both, they are kept separated. This means that there is the possibility that she may be scheduled for a procedure on one day, and medication management the next. I have also informed her that because of staffing and facility limitations, I no longer take patients for medication management only. To illustrate the reasons for this, I gave the patient the example of surgeons, and how inappropriate it would be to refer a patient to his/her care, just to write for the post-surgical antibiotics on a surgery done by a different surgeon.   Because interventional pain management is my board-certified specialty, the patient was informed that joining my practice means that they are open to any and all interventional therapies. I made it clear that this does not mean that they will be forced to have any procedures done. What this means is that I believe interventional therapies to be essential part of the diagnosis and proper management of chronic pain conditions. Therefore, patients not interested in these interventional alternatives will be better served under the care of a different practitioner.  The patient was also made aware of my Comprehensive Pain Management Safety Guidelines where by joining my practice, they limit all of their nerve blocks and joint injections to those done by our practice, for as long as we are retained to manage their care.   Historic Controlled Substance Pharmacotherapy Review  PMP and historical list of controlled substances: Oxycodone/APAP 7.5/325 1 tablet p.o. twice daily; tramadol 50 mg 1 tablet p.o. 3 times daily; oxycodone/APAP 5/325 1 tablet p.o. twice daily; phentermine 37.5 mg Highest opioid analgesic regimen found: Oxycodone/APAP  7.5/325 1 tablet p.o. twice daily (22.5 MME) Most recent opioid analgesic: Oxycodone/APAP 7.5/325 1 tablet p.o. twice daily (22.5 MME) (failed on 04/20/2018) (by: Olivia Mackie Mclean-Scocuzza, MD) Current opioid analgesics:  (None)  Highest recorded MME/day: 22.5 mg/day MME/day: 0 mg/day Medications: Bottles not available for inspection. Pharmacodynamics: Desired effects: Analgesia: The patient reports >50% benefit. Reported improvement in function: The patient reports medication allows her to accomplish basic ADLs. Clinically meaningful improvement in function (CMIF): Sustained CMIF goals met Perceived effectiveness: Described as relatively effective, allowing for increase in activities of daily living (ADL) Undesirable effects: Side-effects or Adverse reactions: None reported Historical Monitoring: The patient  reports no history of drug use. List of all UDS Test(s): No results found. List of other Serum/Urine Drug Screening Test(s):  No results found. Historical Background Evaluation: [place] PMP: PDMP reviewed during this encounter. Six (6) year initial data search conducted.             PMP NARX Score Report:  Narcotic: 120 Sedative: 060 Stimulant: 020 [place] Department of public safety, offender search: Editor, commissioning Information) Non-contributory Risk Assessment Profile: Aberrant behavior: None observed or detected today Risk factors for fatal opioid overdose: None identified today PMP NARX Overdose Risk Score: 210 Fatal overdose hazard ratio (HR): Calculation deferred Non-fatal overdose  hazard ratio (HR): Calculation deferred Risk of opioid abuse or dependence: 0.7-3.0% with doses ? 36 MME/day and 6.1-26% with doses ? 120 MME/day. Substance use disorder (SUD) risk level: See below Personal History of Substance Abuse (SUD-Substance use disorder):  Alcohol: Negative  Illegal Drugs: Negative  Rx Drugs: Negative  ORT Risk Level calculation: Low Risk Opioid Risk Tool - 07/14/18 0837      Family  History of Substance Abuse   Alcohol  Negative    Illegal Drugs  Negative    Rx Drugs  Negative      Personal History of Substance Abuse   Alcohol  Negative    Illegal Drugs  Negative    Rx Drugs  Negative      Age   Age between 9-45 years   No      History of Preadolescent Sexual Abuse   History of Preadolescent Sexual Abuse  Negative or Female      Psychological Disease   Psychological Disease  Negative    Depression  Negative      Total Score   Opioid Risk Tool Scoring  0    Opioid Risk Interpretation  Low Risk      ORT Scoring interpretation table:  Score <3 = Low Risk for SUD  Score between 4-7 = Moderate Risk for SUD  Score >8 = High Risk for Opioid Abuse   Pharmacologic Plan: As per protocol, I have not taken over any controlled substance management, pending the results of ordered tests and/or consults.            Initial impression: Pending review of available data and ordered tests.  Meds   Current Outpatient Medications:  .  Cholecalciferol 1.25 MG (50000 UT) capsule, Take 1 capsule (50,000 Units total) by mouth once a week., Disp: 13 capsule, Rfl: 1 .  gabapentin (NEURONTIN) 300 MG capsule, 1 pill bid bid and 2 pills qhs (Patient taking differently: 2 pills qhs), Disp: 360 capsule, Rfl: 3  ROS  Cardiovascular: No reported cardiovascular signs or symptoms such as High blood pressure, coronary artery disease, abnormal heart rate or rhythm, heart attack, blood thinner therapy or heart weakness and/or failure Pulmonary or Respiratory: Smoking Neurological: No reported neurological signs or symptoms such as seizures, abnormal skin sensations, urinary and/or fecal incontinence, being born with an abnormal open spine and/or a tethered spinal cord Review of Past Neurological Studies: No results found for this or any previous visit. Psychological-Psychiatric: No reported psychological or psychiatric signs or symptoms such as difficulty sleeping, anxiety, depression,  delusions or hallucinations (schizophrenial), mood swings (bipolar disorders) or suicidal ideations or attempts Gastrointestinal: No reported gastrointestinal signs or symptoms such as vomiting or evacuating blood, reflux, heartburn, alternating episodes of diarrhea and constipation, inflamed or scarred liver, or pancreas or irrregular and/or infrequent bowel movements Genitourinary: No reported renal or genitourinary signs or symptoms such as difficulty voiding or producing urine, peeing blood, non-functioning kidney, kidney stones, difficulty emptying the bladder, difficulty controlling the flow of urine, or chronic kidney disease Hematological: No reported hematological signs or symptoms such as prolonged bleeding, low or poor functioning platelets, bruising or bleeding easily, hereditary bleeding problems, low energy levels due to low hemoglobin or being anemic Endocrine: No reported endocrine signs or symptoms such as high or low blood sugar, rapid heart rate due to high thyroid levels, obesity or weight gain due to slow thyroid or thyroid disease Rheumatologic: No reported rheumatological signs and symptoms such as fatigue, joint pain, tenderness, swelling, redness, heat,  stiffness, decreased range of motion, with or without associated rash Musculoskeletal: Negative for myasthenia gravis, muscular dystrophy, multiple sclerosis or malignant hyperthermia Work History: Out of work due to pain  Allergies  Ms. Cronk has No Known Allergies.  Laboratory Chemistry  Inflammation Markers (CRP: Acute Phase) (ESR: Chronic Phase) No results found.  Rheumatology Markers No results found.  Renal Function Markers Lab Results  Component Value Date   BUN 14 04/23/2018   CREATININE 0.69 16/12/9602   BCR NOT APPLICABLE 54/11/8117   GFRAA >60 10/06/2015   GFRNONAA >60 10/06/2015                             Hepatic Function Markers Lab Results  Component Value Date   AST 15 04/23/2018   ALT 18  04/23/2018   ALBUMIN 4.0 04/23/2018   ALKPHOS 86 04/23/2018                        Electrolytes Lab Results  Component Value Date   NA 138 04/23/2018   K 3.8 04/23/2018   CL 103 04/23/2018   CALCIUM 9.3 04/23/2018                        Neuropathy Markers Lab Results  Component Value Date   HGBA1C 6.8 (H) 04/23/2018                        CNS Tests No results found.  Bone Pathology Markers Lab Results  Component Value Date   VD25OH 15.62 (L) 04/23/2018                         Coagulation Parameters Lab Results  Component Value Date   PLT 326.0 04/23/2018                        Cardiovascular Markers Lab Results  Component Value Date   BNP 20 06/11/2011   CKTOTAL 111 06/11/2011   CKMB 0.8 06/11/2011   TROPONINI < 0.02 11/03/2013   HGB 15.2 (H) 04/23/2018   HCT 45.5 04/23/2018                         ID Markers No results found.  CA Markers No results found.  Endocrine Markers Lab Results  Component Value Date   TSH 0.93 08/28/2017   FREET4 1.1 08/28/2017                        Note: Lab results reviewed.  Imaging Review  Lumbosacral Imaging: Lumbar MR wo contrast:  Results for orders placed during the hospital encounter of 09/22/17  MR Lumbar Spine Wo Contrast   Narrative CLINICAL DATA:  History of chronic low back pain. Patient status post lumbar spine surgery June, 2017. Recurrence of low back pain with numbness and tingling in the left leg to the foot in November, 2018. No known injury.  EXAM: MRI LUMBAR SPINE WITHOUT CONTRAST  TECHNIQUE: Multiplanar, multisequence MR imaging of the lumbar spine was performed. No intravenous contrast was administered.  COMPARISON:  MRI lumbar spine 07/19/2015.  FINDINGS: Segmentation:  Standard.  Alignment:  Maintained.  Vertebrae:  Height and signal are normal  Conus medullaris and cauda equina: Conus extends to the L1 level. Conus and cauda equina appear  normal.  Paraspinal and other soft  tissues: Negative.  Disc levels:  T10-11 and T11-12 are imaged in the sagittal plane only and negative.  T12-L1: Mild facet arthropathy.  Otherwise negative.  L1-2: Negative.  L2-3: Negative.  L3-4: Negative.  L4-5: Negative.  L5-S1: Since the prior examination, the patient has undergone left laminotomy for discectomy. There is a broad-based left paracentral protrusion which deforms the left side of the thecal sac and deflects the descending left S1 root. The disc also causes some narrowing in the right lateral recess. Overall, the disc appears smaller than on the prior examination. The foramina are open.  IMPRESSION: Status post left laminotomy for discectomy at L5-S1. The patient has a broad-based left paracentral protrusion which is smaller than on the prior examination but continues to deform the left side of the thecal sac and deflects the descending left S1 root. It also causes mild narrowing in the right lateral recess which could impact the right S1 root.   Electronically Signed   By: Inge Rise M.D.   On: 09/22/2017 10:18    Complexity Note: Imaging results reviewed. Results shared with Ms. Kellie Simmering, using Layman's terms.                        [place]  Drug: Ms. Fei  reports no history of drug use. Alcohol:  reports no history of alcohol use. Tobacco:  reports that she has been smoking cigarettes. She has a 10.00 pack-year smoking history. She has never used smokeless tobacco. Medical:  has a past medical history of Chicken pox, GERD (gastroesophageal reflux disease), History of kidney stones, Hyperlipidemia, Liver lesion, Pneumonia, Pre-diabetes, Prediabetes, and Trigger finger of left thumb. Family: family history includes Arthritis in her mother; Asthma in her daughter; Cancer in her paternal grandfather; Depression in her daughter; Diabetes in her father, maternal grandmother, and paternal grandmother; Early death in her father, mother, and paternal  grandmother; Heart disease in her father, maternal grandfather, maternal grandmother, and mother; Hyperlipidemia in her father and mother; Hypertension in her father and mother; Kidney disease in her father and mother.  Past Surgical History:  Procedure Laterality Date  . ABDOMINAL HYSTERECTOMY     2006/2007 for endometriosis   . BACK SURGERY     L4/5 discectomy 09/18/2015 Dr. Lynann Bologna   . breast reduction     1994  . EXTRACORPOREAL SHOCK WAVE LITHOTRIPSY Right 06/07/2015   Procedure: EXTRACORPOREAL SHOCK WAVE LITHOTRIPSY (ESWL);  Surgeon: Royston Cowper, MD;  Location: [facility];  Service: Urology;  Laterality: Right;  . RADIOLOGY WITH ANESTHESIA N/A 07/19/2015   Procedure: MRI LUMBER SPIN WITHOUT;  Surgeon: Medication Radiologist, MD;  Location: [facility];  Service: Radiology;  Laterality: N/A;  . RADIOLOGY WITH ANESTHESIA N/A 09/22/2017   Procedure: MRI LUMBAR SPINE WITHOUT CONTRAST;  Surgeon: Radiologist, Medication, MD;  Location: [facility];  Service: Radiology;  Laterality: N/A;  . SHOULDER OPEN ROTATOR CUFF REPAIR Bilateral 2007 2008   right 2007, left 2008   . TRIGGER FINGER RELEASE Left 02/18/2016   Procedure: LEFT TRIGGER THUMB RELEASE;  Surgeon: Milly Jakob, MD;  Location: [facility];  Service: Orthopedics;  Laterality: Left;  . WISDOM TOOTH EXTRACTION     Active Ambulatory Problems    Diagnosis Date Noted  . Hot flashes 08/25/2017  . Lumbosacral radiculopathy (S1) (Left) 08/25/2017  . Vitamin D deficiency 08/31/2017  . HLD (hyperlipidemia) 08/31/2017  . DM2 (diabetes mellitus, type 2) ([place]) 08/31/2017  . Tobacco  abuse 08/31/2017  . Polycythemia 08/31/2017  . Nocturnal leg cramps (Bilateral) 06/22/2018  . Sciatica (Left) 07/14/2018  . Chronic pain syndrome 07/14/2018  . Pharmacologic therapy 07/14/2018  . Disorder of skeletal system 07/14/2018  . Problems influencing health status 07/14/2018  . Failed back surgical syndrome 07/14/2018  . Chronic low back pain  (Secondary Area of Pain) (Left) w/ sciatica (Left) 07/14/2018  . Chronic lower extremity pain (Primary area of Pain) (Left) 07/14/2018  . Lumbar facet arthropathy 07/14/2018  . DDD (degenerative disc disease), lumbosacral 07/14/2018  . Leg cramps (Bilateral) 07/14/2018  . Displacement of lumbar intervertebral disc (L5-S1) w/ radiculopathy (S1) (Left) 07/14/2018  . Lumbar spondylosis 07/14/2018  . Chronic musculoskeletal pain 07/14/2018  . Neurogenic pain 07/14/2018  . Abnormal MRI, lumbar spine 07/14/2018   Resolved Ambulatory Problems    Diagnosis Date Noted  . Bronchitis 09/02/2017   Past Medical History:  Diagnosis Date  . Chicken pox   . GERD (gastroesophageal reflux disease)   . History of kidney stones   . Hyperlipidemia   . Liver lesion   . Pneumonia   . Pre-diabetes   . Prediabetes   . Trigger finger of left thumb    Assessment  Primary Diagnosis & Pertinent Problem List: The primary encounter diagnosis was Chronic lower extremity pain (Primary area of Pain) (Left). Diagnoses of Lumbosacral radiculopathy (S1) (Left), Sciatica (Left), Nocturnal leg cramps (Bilateral), Leg cramps (Bilateral), Chronic low back pain (Secondary Area of Pain) (Left) w/ sciatica (Left), DDD (degenerative disc disease), lumbosacral, Displacement of lumbar intervertebral disc (L5-S1) w/ radiculopathy (S1) (Left), Failed back surgical syndrome, Lumbar facet arthropathy, Lumbar spondylosis, Chronic musculoskeletal pain, Neurogenic pain, Chronic pain syndrome, Pharmacologic therapy, Disorder of skeletal system, Problems influencing health status, Vitamin D deficiency, Tobacco abuse, and Abnormal MRI, lumbar spine were also pertinent to this visit.  Visit Diagnosis (New problems to examiner): 1. Chronic lower extremity pain (Primary area of Pain) (Left)   2. Lumbosacral radiculopathy (S1) (Left)   3. Sciatica (Left)   4. Nocturnal leg cramps (Bilateral)   5. Leg cramps (Bilateral)   6. Chronic low  back pain (Secondary Area of Pain) (Left) w/ sciatica (Left)   7. DDD (degenerative disc disease), lumbosacral   8. Displacement of lumbar intervertebral disc (L5-S1) w/ radiculopathy (S1) (Left)   9. Failed back surgical syndrome   10. Lumbar facet arthropathy   11. Lumbar spondylosis   12. Chronic musculoskeletal pain   13. Neurogenic pain   14. Chronic pain syndrome   15. Pharmacologic therapy   16. Disorder of skeletal system   17. Problems influencing health status   18. Vitamin D deficiency   19. Tobacco abuse   20. Abnormal MRI, lumbar spine    Plan of Care (Initial workup plan)  Note: Ms. Selvy was reminded that as per protocol, today's visit has been an evaluation only. We have not taken over the patient's controlled substance management.  Problem-specific plan: No problem-specific Assessment & Plan notes found for this encounter.   Lab Orders     Compliance Drug Analysis, Ur     Comp. Metabolic Panel (12)     Magnesium     Vitamin B12     Sedimentation rate     25-Hydroxyvitamin D Lcms D2+D3     C-reactive protein  Imaging Orders     DG Lumbar Spine Complete W/Bend Referral Orders  No referral(s) requested today    Procedure Orders     Caudal Epidural Injection Pharmacotherapy (current): Medications ordered:  No orders of the defined types were placed in this encounter.  Medications administered during this visit: Effie Lang had no medications administered during this visit.   Pharmacological management options:  Opioid Analgesics: The patient was informed that there is no guarantee that she would be a candidate for opioid analgesics. The decision will be made following CDC guidelines. This decision will be based on the results of diagnostic studies, as well as Ms. Stream's risk profile.   Membrane stabilizer: To be determined at a later time  Muscle relaxant: To be determined at a later time  NSAID: To be determined at a later time  Other  analgesic(s): To be determined at a later time   Interventional management options: Ms. Jasmer was informed that there is no guarantee that she would be a candidate for interventional therapies. The decision will be based on the results of diagnostic studies, as well as Ms. Heiden's risk profile.  Procedure(s) under consideration:  Diagnostic left-sided caudal epidural steroid injection #1 + diagnostic epidurogram  Possible Racz procedure  Diagnostic left-sided L5 transforaminal epidural steroid injection    Provider-requested follow-up: Return for 2nd Visit (Post-tests), w/ Dr. Dossie Arbour, (Virtual) +  Procedure (w/ sedation): (L) Caudal ESI #1 + Ep.  Future Appointments  Date Time Provider [place]  11/24/2018  9:00 AM McLean-Scocuzza, Nino Glow, MD [id number]    Total duration of non-face-to-face encounter: 45 minutes.  Primary Care Physician: McLean-Scocuzza, Nino Glow, MD Location: [facility] Note by: Gaspar Cola, MD Date: 07/14/2018; Time: 1:08 PM

## 2018-07-14 ENCOUNTER — Telehealth: Payer: Self-pay

## 2018-07-14 ENCOUNTER — Other Ambulatory Visit: Payer: Self-pay

## 2018-07-14 ENCOUNTER — Ambulatory Visit: Payer: Self-pay | Attending: Pain Medicine | Admitting: Pain Medicine

## 2018-07-14 ENCOUNTER — Encounter: Payer: Self-pay | Admitting: Pain Medicine

## 2018-07-14 DIAGNOSIS — M5442 Lumbago with sciatica, left side: Secondary | ICD-10-CM

## 2018-07-14 DIAGNOSIS — M5116 Intervertebral disc disorders with radiculopathy, lumbar region: Secondary | ICD-10-CM

## 2018-07-14 DIAGNOSIS — Z720 Tobacco use: Secondary | ICD-10-CM

## 2018-07-14 DIAGNOSIS — R252 Cramp and spasm: Secondary | ICD-10-CM

## 2018-07-14 DIAGNOSIS — M5432 Sciatica, left side: Secondary | ICD-10-CM | POA: Insufficient documentation

## 2018-07-14 DIAGNOSIS — M5137 Other intervertebral disc degeneration, lumbosacral region: Secondary | ICD-10-CM | POA: Insufficient documentation

## 2018-07-14 DIAGNOSIS — M961 Postlaminectomy syndrome, not elsewhere classified: Secondary | ICD-10-CM

## 2018-07-14 DIAGNOSIS — G894 Chronic pain syndrome: Secondary | ICD-10-CM

## 2018-07-14 DIAGNOSIS — M47816 Spondylosis without myelopathy or radiculopathy, lumbar region: Secondary | ICD-10-CM | POA: Insufficient documentation

## 2018-07-14 DIAGNOSIS — G4762 Sleep related leg cramps: Secondary | ICD-10-CM

## 2018-07-14 DIAGNOSIS — Z789 Other specified health status: Secondary | ICD-10-CM

## 2018-07-14 DIAGNOSIS — M7918 Myalgia, other site: Secondary | ICD-10-CM | POA: Insufficient documentation

## 2018-07-14 DIAGNOSIS — R937 Abnormal findings on diagnostic imaging of other parts of musculoskeletal system: Secondary | ICD-10-CM | POA: Insufficient documentation

## 2018-07-14 DIAGNOSIS — M792 Neuralgia and neuritis, unspecified: Secondary | ICD-10-CM

## 2018-07-14 DIAGNOSIS — M79605 Pain in left leg: Secondary | ICD-10-CM

## 2018-07-14 DIAGNOSIS — M5417 Radiculopathy, lumbosacral region: Secondary | ICD-10-CM

## 2018-07-14 DIAGNOSIS — G8929 Other chronic pain: Secondary | ICD-10-CM | POA: Insufficient documentation

## 2018-07-14 DIAGNOSIS — Z79899 Other long term (current) drug therapy: Secondary | ICD-10-CM

## 2018-07-14 DIAGNOSIS — E559 Vitamin D deficiency, unspecified: Secondary | ICD-10-CM

## 2018-07-14 DIAGNOSIS — M899 Disorder of bone, unspecified: Secondary | ICD-10-CM

## 2018-07-14 DIAGNOSIS — M51379 Other intervertebral disc degeneration, lumbosacral region without mention of lumbar back pain or lower extremity pain: Secondary | ICD-10-CM

## 2018-07-14 DIAGNOSIS — Z0000 Encounter for general adult medical examination without abnormal findings: Secondary | ICD-10-CM | POA: Insufficient documentation

## 2018-07-14 NOTE — Patient Instructions (Signed)
Patient instructions: 1. Increase the gabapentin 300mg  to 3 tablets at bedtime (900 mg) 2.  Start taking vitamin D3 5000 international units every day. 3.  Begin taking sugar-free Gatorade 1 glass with each meal to help with the cramps. 4.  Begin taking 1 multivitamin every day. 5.  Begin taking magnesium 500 mg p.o. twice daily.  If you experience diarrhea then decrease the dose to 500 mg at bedtime.  ____________________________________________________________________________________________  Preparing for Procedure with Sedation  Procedure appointments are limited to planned procedures: . No Prescription Refills. . No disability issues will be discussed. . No medication changes will be discussed.  Instructions: . Oral Intake: Do not eat or drink anything for at least 8 hours prior to your procedure. . Transportation: Public transportation is not allowed. Bring an adult driver. The driver must be physically present in our waiting room before any procedure can be started. Marland Kitchen Physical Assistance: Bring an adult physically capable of assisting you, in the event you need help. This adult should keep you company at home for at least 6 hours after the procedure. . Blood Pressure Medicine: Take your blood pressure medicine with a sip of water the morning of the procedure. . Blood thinners: Notify our staff if you are taking any blood thinners. Depending on which one you take, there will be specific instructions on how and when to stop it. . Diabetics on insulin: Notify the staff so that you can be scheduled 1st case in the morning. If your diabetes requires high dose insulin, take only  of your normal insulin dose the morning of the procedure and notify the staff that you have done so. . Preventing infections: Shower with an antibacterial soap the morning of your procedure. . Build-up your immune system: Take 1000 mg of Vitamin C with every meal (3 times a day) the day prior to your  procedure. Marland Kitchen Antibiotics: Inform the staff if you have a condition or reason that requires you to take antibiotics before dental procedures. . Pregnancy: If you are pregnant, call and cancel the procedure. . Sickness: If you have a cold, fever, or any active infections, call and cancel the procedure. . Arrival: You must be in the facility at least 30 minutes prior to your scheduled procedure. . Children: Do not bring children with you. . Dress appropriately: Bring dark clothing that you would not mind if they get stained. . Valuables: Do not bring any jewelry or valuables.  Reasons to call and reschedule or cancel your procedure: (Following these recommendations will minimize the risk of a serious complication.) . Surgeries: Avoid having procedures within 2 weeks of any surgery. (Avoid for 2 weeks before or after any surgery). . Flu Shots: Avoid having procedures within 2 weeks of a flu shots or . (Avoid for 2 weeks before or after immunizations). . Barium: Avoid having a procedure within 7-10 days after having had a radiological study involving the use of radiological contrast. (Myelograms, Barium swallow or enema study). . Heart attacks: Avoid any elective procedures or surgeries for the initial 6 months after a "Myocardial Infarction" (Heart Attack). . Blood thinners: It is imperative that you stop these medications before procedures. Let us know if you if you take any blood thinner.  . Infection: Avoid procedures during or within two weeks of an infection (including chest colds or gastrointestinal problems). Symptoms associated with infections include: Localized redness, fever, chills, night sweats or profuse sweating, burning sensation when voiding, cough, congestion, stuffiness, runny nose, sore throat, diarrhea,  nausea, vomiting, cold or Flu symptoms, recent or current infections. It is specially important if the infection is over the area that we intend to treat. Marland Kitchen Heart and lung problems:  Symptoms that may suggest an active cardiopulmonary problem include: cough, chest pain, breathing difficulties or shortness of breath, dizziness, ankle swelling, uncontrolled high or unusually low blood pressure, and/or palpitations. If you are experiencing any of these symptoms, cancel your procedure and contact your primary care physician for an evaluation.  Remember:  Regular Business hours are:  Monday to Thursday 8:00 AM to 4:00 PM  Provider's Schedule: Milinda Pointer, MD:  Procedure days: Tuesday and Thursday 7:30 AM to 4:00 PM  Gillis Santa, MD:  Procedure days: Monday and Wednesday 7:30 AM to 4:00 PM ____________________________________________________________________________________________   ____________________________________________________________________________________________  General Risks and Possible Complications  Patient Responsibilities: It is important that you read this as it is part of your informed consent. It is our duty to inform you of the risks and possible complications associated with treatments offered to you. It is your responsibility as a patient to read this and to ask questions about anything that is not clear or that you believe was not covered in this document.  Patient's Rights: You have the right to refuse treatment. You also have the right to change your mind, even after initially having agreed to have the treatment done. However, under this last option, if you wait until the last second to change your mind, you may be charged for the materials used up to that point.  Introduction: Medicine is not an Chief Strategy Officer. Everything in Medicine, including the lack of treatment(s), carries the potential for danger, harm, or loss (which is by definition: Risk). In Medicine, a complication is a secondary problem, condition, or disease that can aggravate an already existing one. All treatments carry the risk of possible complications. The fact that a side  effects or complications occurs, does not imply that the treatment was conducted incorrectly. It must be clearly understood that these can happen even when everything is done following the highest safety standards.  No treatment: You can choose not to proceed with the proposed treatment alternative. The "PRO(s)" would include: avoiding the risk of complications associated with the therapy. The "CON(s)" would include: not getting any of the treatment benefits. These benefits fall under one of three categories: diagnostic; therapeutic; and/or palliative. Diagnostic benefits include: getting information which can ultimately lead to improvement of the disease or symptom(s). Therapeutic benefits are those associated with the successful treatment of the disease. Finally, palliative benefits are those related to the decrease of the primary symptoms, without necessarily curing the condition (example: decreasing the pain from a flare-up of a chronic condition, such as incurable terminal cancer).  General Risks and Complications: These are associated to most interventional treatments. They can occur alone, or in combination. They fall under one of the following six (6) categories: no benefit or worsening of symptoms; bleeding; infection; nerve damage; allergic reactions; and/or death. 1. No benefits or worsening of symptoms: In Medicine there are no guarantees, only probabilities. No healthcare provider can ever guarantee that a medical treatment will work, they can only state the probability that it may. Furthermore, there is always the possibility that the condition may worsen, either directly, or indirectly, as a consequence of the treatment. 2. Bleeding: This is more common if the patient is taking a blood thinner, either prescription or over the counter (example: Goody Powders, Fish oil, Aspirin, Garlic, etc.), or if suffering  a condition associated with impaired coagulation (example: Hemophilia, cirrhosis of the  liver, low platelet counts, etc.). However, even if you do not have one on these, it can still happen. If you have any of these conditions, or take one of these drugs, make sure to notify your treating physician. 3. Infection: This is more common in patients with a compromised immune system, either due to disease (example: diabetes, cancer, human immunodeficiency virus [HIV], etc.), or due to medications or treatments (example: therapies used to treat cancer and rheumatological diseases). However, even if you do not have one on these, it can still happen. If you have any of these conditions, or take one of these drugs, make sure to notify your treating physician. 4. Nerve Damage: This is more common when the treatment is an invasive one, but it can also happen with the use of medications, such as those used in the treatment of cancer. The damage can occur to small secondary nerves, or to large primary ones, such as those in the spinal cord and brain. This damage may be temporary or permanent and it may lead to impairments that can range from temporary numbness to permanent paralysis and/or brain death. 5. Allergic Reactions: Any time a substance or material comes in contact with our body, there is the possibility of an allergic reaction. These can range from a mild skin rash (contact dermatitis) to a severe systemic reaction (anaphylactic reaction), which can result in death. 6. Death: In general, any medical intervention can result in death, most of the time due to an unforeseen complication. ____________________________________________________________________________________________  ____________________________________________________________________________________________  Muscle Spasms & Cramps  Cause:  The most common cause of muscle spasms and cramps is vitamin and/or electrolyte (calcium, potassium, sodium, etc.) deficiencies.  Possible triggers: Sweating - causes loss of electrolytes thru  the skin. Steroids - causes loss of electrolytes thru the urine.  Treatment: 1. Gatorade (or any other electrolyte-replenishing drink) - Take 1, 8 oz glass with each meal (3 times a day). 2. OTC (over-the-counter) Magnesium 400 to 500 mg - Take 1 tablet twice a day (one with breakfast and one before bedtime). If you have kidney problems, talk to your primary care physician before taking any Magnesium. 3. Tonic Water with quinine - Take 1, 8 oz glass before bedtime.   ____________________________________________________________________________________________   ____________________________________________________________________________________________  Medication Rules  Purpose: To inform patients, and their family members, of our rules and regulations.  Applies to: All patients receiving prescriptions (written or electronic).  Pharmacy of record: Pharmacy where electronic prescriptions will be sent. If written prescriptions are taken to a different pharmacy, please inform the nursing staff. The pharmacy listed in the electronic medical record should be the one where you would like electronic prescriptions to be sent.  Electronic prescriptions: In compliance with the [place] (STOP) Act of 2017 (Session Lanny Cramp [phone]), effective March 24, 2018, all controlled substances must be electronically prescribed. Calling prescriptions to the pharmacy will cease to exist.  Prescription refills: Only during scheduled appointments. Applies to all prescriptions.  NOTE: The following applies primarily to controlled substances (Opioid* Pain Medications).   Patient's responsibilities: 1. Pain Pills: Bring all pain pills to every appointment (except for procedure appointments). 2. Pill Bottles: Bring pills in original pharmacy bottle. Always bring the newest bottle. Bring bottle, even if empty. 3. Medication refills: You are responsible for knowing and  keeping track of what medications you take and those you need refilled. The day before your appointment: write  a list of all prescriptions that need to be refilled. The day of the appointment: give the list to the admitting nurse. Prescriptions will be written only during appointments. No prescriptions will be written on procedure days. If you forget a medication: it will not be "Called in", "Faxed", or "electronically sent". You will need to get another appointment to get these prescribed. No early refills. Do not call asking to have your prescription filled early. 4. Prescription Accuracy: You are responsible for carefully inspecting your prescriptions before leaving our office. Have the discharge nurse carefully go over each prescription with you, before taking them home. Make sure that your name is accurately spelled, that your address is correct. Check the name and dose of your medication to make sure it is accurate. Check the number of pills, and the written instructions to make sure they are clear and accurate. Make sure that you are given enough medication to last until your next medication refill appointment. 5. Taking Medication: Take medication as prescribed. When it comes to controlled substances, taking less pills or less frequently than prescribed is permitted and encouraged. Never take more pills than instructed. Never take medication more frequently than prescribed.  6. Inform other Doctors: Always inform, all of your healthcare providers, of all the medications you take. 7. Pain Medication from other Providers: You are not allowed to accept any additional pain medication from any other Doctor or Healthcare provider. There are two exceptions to this rule. (see below) In the event that you require additional pain medication, you are responsible for notifying us, as stated below. 8. Medication Agreement: You are responsible for carefully reading and following our Medication Agreement. This  must be signed before receiving any prescriptions from our practice. Safely store a copy of your signed Agreement. Violations to the Agreement will result in no further prescriptions. (Additional copies of our Medication Agreement are available upon request.) 9. Laws, Rules, & Regulations: All patients are expected to follow all Federal and [employer], [employer], Rules, [employer]. Ignorance of the Laws does not constitute a valid excuse. The use of any illegal substances is prohibited. 10. Adopted CDC guidelines & recommendations: Target dosing levels will be at or below 60 MME/day. Use of benzodiazepines** is not recommended.  Exceptions: There are only two exceptions to the rule of not receiving pain medications from other Healthcare Providers. 1. Exception #1 (Emergencies): In the event of an emergency (i.e.: accident requiring emergency care), you are allowed to receive additional pain medication. However, you are responsible for: As soon as you are able, call our office [phone], at any time of the day or night, and leave a message stating your name, the date and nature of the emergency, and the name and dose of the medication prescribed. In the event that your call is answered by a member of our staff, make sure to document and save the date, time, and the name of the person that took your information.  2. Exception #2 (Planned Surgery): In the event that you are scheduled by another doctor or dentist to have any type of surgery or procedure, you are allowed (for a period no longer than 30 days), to receive additional pain medication, for the acute post-op pain. However, in this case, you are responsible for picking up a copy of our "Post-op Pain Management for Surgeons" handout, and giving it to your surgeon or dentist. This document is available at our office, and does not require an appointment to obtain it. Simply  go to our office during business hours (Monday-Thursday from 8:00 AM to 4:00  PM) (Friday 8:00 AM to 12:00 Noon) or if you have a scheduled appointment with [place], prior to your surgery, and ask for it by name. In addition, you will need to provide [place] with your name, name of your surgeon, type of surgery, and date of procedure or surgery.  *Opioid medications include: morphine, codeine, oxycodone, oxymorphone, hydrocodone, hydromorphone, meperidine, tramadol, tapentadol, buprenorphine, fentanyl, methadone. **Benzodiazepine medications include: diazepam (Valium), alprazolam (Xanax), clonazepam (Klonopine), lorazepam (Ativan), clorazepate (Tranxene), chlordiazepoxide (Librium), estazolam (Prosom), oxazepam (Serax), temazepam (Restoril), triazolam (Halcion) (Last updated: 05/21/2017) ____________________________________________________________________________________________   ____________________________________________________________________________________________  Medication Recommendations and Reminders  Applies to: All patients receiving prescriptions (written and/or electronic).  Medication Rules & Regulations: These rules and regulations exist for your safety and that of others. They are not flexible and neither are we. Dismissing or ignoring them will be considered "non-compliance" with medication therapy, resulting in complete and irreversible termination of such therapy. (See document titled "Medication Rules" for more details.) In all conscience, because of safety reasons, we cannot continue providing a therapy where the patient does not follow instructions.  Pharmacy of record:   Definition: This is the pharmacy where your electronic prescriptions will be sent.   We do not endorse any particular pharmacy.  You are not restricted in your choice of pharmacy.  The pharmacy listed in the electronic medical record should be the one where you want electronic prescriptions to be sent.  If you choose to change pharmacy, simply notify our nursing staff of your choice  of new pharmacy.  Recommendations:  Keep all of your pain medications in a safe place, under lock and key, even if you live alone.   After you fill your prescription, take 1 week's worth of pills and put them away in a safe place. You should keep a separate, properly labeled bottle for this purpose. The remainder should be kept in the original bottle. Use this as your primary supply, until it runs out. Once it's gone, then you know that you have 1 week's worth of medicine, and it is time to come in for a prescription refill. If you do this correctly, it is unlikely that you will ever run out of medicine.  To make sure that the above recommendation works, it is very important that you make sure your medication refill appointments are scheduled at least 1 week before you run out of medicine. To do this in an effective manner, make sure that you do not leave the office without scheduling your next medication management appointment. Always ask the nursing staff to show you in your prescription , when your medication will be running out. Then arrange for the receptionist to get you a return appointment, at least 7 days before you run out of medicine. Do not wait until you have 1 or 2 pills left, to come in. This is very poor planning and does not take into consideration that we may need to cancel appointments due to bad weather, sickness, or emergencies affecting our staff.  "Partial Fill": If for any reason your pharmacy does not have enough pills/tablets to completely fill or refill your prescription, do not allow for a "partial fill". You will need a separate prescription to fill the remaining amount, which we will not provide. If the reason for the partial fill is your insurance, you will need to talk to the pharmacist about payment alternatives for the remaining tablets, but again, do not  accept a partial fill.  Prescription refills and/or changes in medication(s):   Prescription refills, and/or  changes in dose or medication, will be conducted only during scheduled medication management appointments. (Applies to both, written and electronic prescriptions.)  No refills on procedure days. No medication will be changed or started on procedure days. No changes, adjustments, and/or refills will be conducted on a procedure day. Doing so will interfere with the diagnostic portion of the procedure.  No phone refills. No medications will be "called into the pharmacy".  No Fax refills.  No weekend refills.  No Holliday refills.  No after hours refills.  Remember:  Business hours are:  Monday to Thursday 8:00 AM to 4:00 PM Provider's Schedule: Dionisio David, NP - Appointments are:  Medication management: Monday to Thursday 8:00 AM to 4:00 PM Milinda Pointer, MD - Appointments are:  Medication management: Monday and Wednesday 8:00 AM to 4:00 PM Procedure day: Tuesday and Thursday 7:30 AM to 4:00 PM Gillis Santa, MD - Appointments are:  Medication management: Tuesday and Thursday 8:00 AM to 4:00 PM Procedure day: Monday and Wednesday 7:30 AM to 4:00 PM (Last update: 05/21/2017) ____________________________________________________________________________________________   ____________________________________________________________________________________________  CANNABIDIOL (AKA: CBD Oil or Pills)  Applies to: All patients receiving prescriptions of controlled substances (written and/or electronic).  General Information: Cannabidiol (CBD) was discovered in 40. It is one of some 113 identified cannabinoids in cannabis (Marijuana) plants, accounting for up to 40% of the plant's extract. As of 2018, preliminary clinical research on cannabidiol included studies of anxiety, cognition, movement disorders, and pain.  Cannabidiol is consummed in multiple ways, including inhalation of cannabis smoke or vapor, as an aerosol spray into the cheek, and by mouth. It may be supplied as CBD oil  containing CBD as the active ingredient (no added tetrahydrocannabinol (THC) or terpenes), a full-plant CBD-dominant hemp extract oil, capsules, dried cannabis, or as a liquid solution. CBD is thought not have the same psychoactivity as THC, and may affect the actions of THC. Studies suggest that CBD may interact with different biological targets, including cannabinoid receptors and other neurotransmitter receptors. As of 2018 the mechanism of action for its biological effects has not been determined.  In the [place], cannabidiol has a limited approval by the Food and Drug Administration (FDA) for treatment of only two types of epilepsy disorders. The side effects of long-term use of the drug include somnolence, decreased appetite, diarrhea, fatigue, malaise, weakness, sleeping problems, and others.  CBD remains a Schedule I drug prohibited for any use.  Legality: Some manufacturers ship CBD products nationally, an illegal action which the FDA has not enforced in 2018, with CBD remaining the subject of an FDA investigational new drug evaluation, and is not considered legal as a dietary supplement or food ingredient as of December 2018. Federal illegality has made it difficult historically to conduct research on CBD. CBD is openly sold in head shops and health food stores in some states where such sales have not been explicitly legalized.  Warning: Because it is not FDA approved for general use or treatment of pain, it is not required to undergo the same manufacturing controls as prescription drugs.  This means that the available cannabidiol (CBD) may be contaminated with THC.  If this is the case, it will trigger a positive urine drug screen (UDS) test for cannabinoids (Marijuana).  Because a positive UDS for illicit substances is a violation of our medication agreement, your opioid analgesics (pain medicine) may be permanently discontinued. (Last update:  06/11/2017) ____________________________________________________________________________________________    ______________________________________________________________________________________________  Specialty Pain Scale  Introduction:  There are significant differences in how pain is reported. The word pain usually refers to physical pain, but it is also a common synonym of suffering. The medical community uses a scale from 0 (zero) to 10 (ten) to report pain level. Zero (0) is described as "no pain", while ten (10) is described as "the worse pain you can imagine". The problem with this scale is that physical pain is reported along with suffering. Suffering refers to mental pain, or more often yet it refers to any unpleasant feeling, emotion or aversion associated with the perception of harm or threat of harm. It is the psychological component of pain.  Pain Specialists prefer to separate the two components. The pain scale used by this practice is the Verbal Numerical Rating Scale (VNRS-11). This scale is for the physical pain only. DO NOT INCLUDE how your pain psychologically affects you. This scale is for adults 36 years of age and older. It has 11 (eleven) levels. The 1st level is 0/10. This means: "right now, I have no pain". In the context of pain management, it also means: "right now, my physical pain is under control with the current therapy".  General Information:  The scale should reflect your current level of pain. Unless you are specifically asked for the level of your worst pain, or your average pain. If you are asked for one of these two, then it should be understood that it is over the past 24 hours.  Levels 1 (one) through 5 (five) are described below, and can be treated as an outpatient. Ambulatory pain management facilities such as ours are more than adequate to treat these levels. Levels 6 (six) through 10 (ten) are also described below, however, these must be treated as a  hospitalized patient. While levels 6 (six) and 7 (seven) may be evaluated at an urgent care facility, levels 8 (eight) through 10 (ten) constitute medical emergencies and as such, they belong in a hospital's emergency department. When having these levels (as described below), do not come to our office. Our facility is not equipped to manage these levels. Go directly to an urgent care facility or an emergency department to be evaluated.  Definitions:  Activities of Daily Living (ADL): Activities of daily living (ADL or ADLs) is a term used in healthcare to refer to people's daily self-care activities. Health professionals often use a person's ability or inability to perform ADLs as a measurement of their functional status, particularly in regard to people post injury, with disabilities and the elderly. There are two ADL levels: Basic and Instrumental. Basic Activities of Daily Living (BADL  or BADLs) consist of self-care tasks that include: Bathing and showering; personal hygiene and grooming (including brushing/combing/styling hair); dressing; Toilet hygiene (getting to the toilet, cleaning oneself, and getting back up); eating and self-feeding (not including cooking or chewing and swallowing); functional mobility, often referred to as "transferring", as measured by the ability to walk, get in and out of bed, and get into and out of a chair; the broader definition (moving from one place to another while performing activities) is useful for people with different physical abilities who are still able to get around independently. Basic ADLs include the things many people do when they get up in the morning and get ready to go out of the house: get out of bed, go to the toilet, bathe, dress, groom, and eat. On the average, loss of function  typically follows a particular order. Hygiene is the first to go, followed by loss of toilet use and locomotion. The last to go is the ability to eat. When there is only one  remaining area in which the person is independent, there is a 62.9% chance that it is eating and only a 3.5% chance that it is hygiene. Instrumental Activities of Daily Living (IADL or IADLs) are not necessary for fundamental functioning, but they let an individual live independently in a community. IADL consist of tasks that include: cleaning and maintaining the house; home establishment and maintenance; care of others (including selecting and supervising caregivers); care of pets; child rearing; managing money; managing financials (investments, etc.); meal preparation and cleanup; shopping for groceries and necessities; moving within the community; safety procedures and emergency responses; health management and maintenance (taking prescribed medications); and using the telephone or other form of communication.  Instructions:  Most patients tend to report their pain as a combination of two factors, their physical pain and their psychosocial pain. This last one is also known as "suffering" and it is reflection of how physical pain affects you socially and psychologically. From now on, report them separately.  From this point on, when asked to report your pain level, report only your physical pain. Use the following table for reference.  Pain Clinic Pain Levels (0-5/10)  Pain Level Score  Description  No Pain 0   Mild pain 1 Nagging, annoying, but does not interfere with basic activities of daily living (ADL). Patients are able to eat, bathe, get dressed, toileting (being able to get on and off the toilet and perform personal hygiene functions), transfer (move in and out of bed or a chair without assistance), and maintain continence (able to control bladder and bowel functions). Blood pressure and heart rate are unaffected. A normal heart rate for a healthy adult ranges from 60 to 100 bpm (beats per minute).   Mild to moderate pain 2 Noticeable and distracting. Impossible to hide from other people. More  frequent flare-ups. Still possible to adapt and function close to normal. It can be very annoying and may have occasional stronger flare-ups. With discipline, patients may get used to it and adapt.   Moderate pain 3 Interferes significantly with activities of daily living (ADL). It becomes difficult to feed, bathe, get dressed, get on and off the toilet or to perform personal hygiene functions. Difficult to get in and out of bed or a chair without assistance. Very distracting. With effort, it can be ignored when deeply involved in activities.   Moderately severe pain 4 Impossible to ignore for more than a few minutes. With effort, patients may still be able to manage work or participate in some social activities. Very difficult to concentrate. Signs of autonomic nervous system discharge are evident: dilated pupils (mydriasis); mild sweating (diaphoresis); sleep interference. Heart rate becomes elevated (>115 bpm). Diastolic blood pressure (lower number) rises above 100 mmHg. Patients find relief in laying down and not moving.   Severe pain 5 Intense and extremely unpleasant. Associated with frowning face and frequent crying. Pain overwhelms the senses.  Ability to do any activity or maintain social relationships becomes significantly limited. Conversation becomes difficult. Pacing back and forth is common, as getting into a comfortable position is nearly impossible. Pain wakes you up from deep sleep. Physical signs will be obvious: pupillary dilation; increased sweating; goosebumps; brisk reflexes; cold, clammy hands and feet; nausea, vomiting or dry heaves; loss of appetite; significant sleep disturbance with inability  to fall asleep or to remain asleep. When persistent, significant weight loss is observed due to the complete loss of appetite and sleep deprivation.  Blood pressure and heart rate becomes significantly elevated. Caution: If elevated blood pressure triggers a pounding headache associated with  blurred vision, then the patient should immediately seek attention at an urgent or emergency care unit, as these may be signs of an impending stroke.    Emergency Department Pain Levels (6-10/10)  Emergency Room Pain 6 Severely limiting. Requires emergency care and should not be seen or managed at an outpatient pain management facility. Communication becomes difficult and requires great effort. Assistance to reach the emergency department may be required. Facial flushing and profuse sweating along with potentially dangerous increases in heart rate and blood pressure will be evident.   Distressing pain 7 Self-care is very difficult. Assistance is required to transport, or use restroom. Assistance to reach the emergency department will be required. Tasks requiring coordination, such as bathing and getting dressed become very difficult.   Disabling pain 8 Self-care is no longer possible. At this level, pain is disabling. The individual is unable to do even the most "basic" activities such as walking, eating, bathing, dressing, transferring to a bed, or toileting. Fine motor skills are lost. It is difficult to think clearly.   Incapacitating pain 9 Pain becomes incapacitating. Thought processing is no longer possible. Difficult to remember your own name. Control of movement and coordination are lost.   The worst pain imaginable 10 At this level, most patients pass out from pain. When this level is reached, collapse of the autonomic nervous system occurs, leading to a sudden drop in blood pressure and heart rate. This in turn results in a temporary and dramatic drop in blood flow to the brain, leading to a loss of consciousness. Fainting is one of the body's self defense mechanisms. Passing out puts the brain in a calmed state and causes it to shut down for a while, in order to begin the healing process.    Summary: 1.   Refer to this scale when providing [place] with your pain level. 2.   Be accurate and  careful when reporting your pain level. This will help with your care. 3.   Over-reporting your pain level will lead to loss of credibility. 4.   Even a level of 1/10 means that there is pain and will be treated at our facility. 5.   High, inaccurate reporting will be documented as "Symptom Exaggeration", leading to loss of credibility and suspicions of possible secondary gains such as obtaining more narcotics, or wanting to appear disabled, for fraudulent reasons. 6.   Only pain levels of 5 or below will be seen at our facility. 7.   Pain levels of 6 and above will be sent to the Emergency Department and the appointment cancelled.  ______________________________________________________________________________________________

## 2018-07-14 NOTE — Telephone Encounter (Signed)
Mailed AVS from 07/14/18 visit

## 2018-07-26 NOTE — Progress Notes (Deleted)
Patient's Name: Lori Lang  MRN: [id number]  Referring Provider: Orland Mustard *  DOB: October 21, 1970  PCP: McLean-Scocuzza, Nino Glow, MD  DOS: 07/28/2018  Note by: Gaspar Cola, MD  Service setting: Virtual Visit (Telephone)  Attending: Gaspar Cola, MD  Location: Telephone Encounter  Specialty: Interventional Pain Management  Patient type: Established   Pain Management Virtual Encounter Note - Virtual Visit via  ***  Telehealth (real-time audio visits between healthcare provider and patient).  Patient's Phone No.:  [phone] (home); [phone] (mobile); (Preferred) [phone] [email or web address]  [id number] [place], [place] - [address] [place] [place]Phone: [phone]: [phone]  Pre-screening note:  Our staff contacted Ms. LLapidand offered her an "in person", "face-to-face" appointment versus a telephone encounter. She indicated preferring the telephone encounter, at this time.   Primary Reason(s) for Virtual Visit: Encounter for evaluation before starting new chronic pain management plan of care (Level of risk: moderate) COVID-19*  Social distancing based on CDC ans AMA recommendations.    I contacted KBLU MCGLAUNon 07/28/2018 at 7:15 PM via  ***  and clearly identified myself as FGaspar Cola MD. I verified that I was speaking with the correct person using two identifiers (Name and date of birth: 50-Jul-1972.  Advanced Informed Consent I sought verbal advanced consent from KEffie Shyfor virtual visit interactions. I informed Ms. LDuranteof possible security and privacy concerns, risks, and limitations associated with providing "not-in-person" medical evaluation and management services. I also informed Ms. LKloosterof the availability of "in-person" appointments. Finally, I informed her that there would be a charge for the virtual visit and that she could be  personally, fully or partially, financially  responsible for it. Ms. LFauthexpressed understanding and agreed to proceed.   Historic Elements   Ms. KMARGARETH KANNERis a 48y.o. year old, female patient evaluated today after her last encounter by our practice on 07/14/2018. Ms. LLimburg has a past medical history of Chicken pox, GERD (gastroesophageal reflux disease), History of kidney stones, Hyperlipidemia, Liver lesion, Pneumonia, Pre-diabetes, Prediabetes, and Trigger finger of left thumb. She also  has a past surgical history that includes Extracorporeal shock wave lithotripsy (Right, 06/07/2015); Shoulder open rotator cuff repair (Bilateral, 2007 2008); Wisdom tooth extraction; Radiology with anesthesia (N/A, 07/19/2015); Trigger finger release (Left, 02/18/2016); Back surgery; breast reduction; Abdominal hysterectomy; and Radiology with anesthesia (N/A, 09/22/2017). Ms. LDandyhas a current medication list which includes the following prescription(s): cholecalciferol and gabapentin. She  reports that she has been smoking cigarettes. She has a 10.00 pack-year smoking history. She has never used smokeless tobacco. She reports that she does not drink alcohol or use drugs. Ms. LDunckelhas No Known Allergies.   HPI  She is being evaluated for review of studies ordered on initial visit and to consider treatment plan options. Today I went over the results of her tests. These were explained in "Layman's terms". During today's appointment I went over my diagnostic impression, as well as the proposed treatment plan.  According to the patient her primary area of pain is that of the left lower extremity where the pain goes all the way down into the bottom of the foot and smaller toes on the lateral aspect of the foot, traveling thru the back of the leg.  When asked to perform toe walking and/or standing she indicated having difficulty doing that with the left leg compared to the right.  The  patient was asked if this was secondary to pain or weakness and she  indicated that it was secondary to weakness of the leg.  The patient denies experiencing any type of pain on the right lower extremity, but does admit to bilateral nocturnal cramps in the area of her calves and occasionally the thighs.  She denies any trauma to the lower extremities, any surgical procedures in the legs, injections or x-rays of the lower extremity.  She does admit having had some back surgery done in June 2017 by Dr. Lynann Bologna at [facility].  The patient indicates no postoperative complications and she also indicates that this completely eliminated her lower extremity pain for over a year.  After about a year, she began to experience a gradual onset and recurrence of the pain.  This pain is been described by the patient as being identical in terms of its location, to the pain that she had before the surgery.  During the year that she spent without any pain, she indicates not having been taking any pain medicine.  As of now, the patient indicates that her leg pain pattern is identical to the one she had before her surgery.  The only difference is at this time this is hurting more than before the surgery.  The patient indicates having had physical therapy after the surgery which did seem to help at the time.  In terms of this leg pain she indicates that the worst part is in the left buttocks area, followed by the back of her thigh in the hamstring area, followed by the calf region.  The patient's secondary area of pain is that of the lower back on the left side with some occasional involvement of the midline.  She denies any pain on the right side of the lower back or the right leg.  When comparing the lower extremity pain in the low back pain she describes the left lower extremity pain to be a lot worse than the low back pain.  As previously stated the patient underwent a lumbar hemilaminectomy on the left side around June 2017, by Dr. Lynann Bologna.  She also admits to having had 1 lumbar  epidural steroid injection done at [place] and her him, before her back surgery.  She describes having had complete relief of the pain for the duration of the local anesthetic, followed by recurrence of the pain and then a second wave of relief 2 weeks later which lasted for approximately a month.  However, when she went back to [facility] to describe the results of her epidural, she mentioned that the pain was still there in the leg in that it did not help.  Apparently they took this at face value and sent the patient to have a back surgery.  Currently the patient describes managing her pain with gabapentin 300 mg 2 tablets p.o. at bedtime.  She describes no side effects or problems with the use of the gabapentin.  Today I took the time to review the patient's last lumbar MRI as well as her lab work.  She has evidence of a vitamin D deficiency, which to a certain extent could be contributing to her inability to sleep at night.  She describes that her insomnia is secondary to her pain.  ***  In considering the treatment plan options, Ms. Limon was reminded that I no longer take patients for medication management only. I asked her to let me know if she had no intention of taking advantage of  the interventional therapies, so that we could make arrangements to provide this space to someone interested. I also made it clear that undergoing interventional therapies for the purpose of getting pain medications is very inappropriate on the part of a patient, and it will not be tolerated in this practice. This type of behavior would suggest true addiction and therefore it requires referral to an addiction specialist.   I discussed the assessment and treatment plan with the patient. The patient was provided an opportunity to ask questions and all were answered. The patient agreed with the plan and demonstrated an understanding of the instructions.  Patient advised to call back or seek an  in-person evaluation if the symptoms or condition worsens.  Controlled Substance Pharmacotherapy Assessment REMS (Risk Evaluation and Mitigation Strategy)  Analgesic: ***   Monitoring: [place] PMP: PDMP not reviewed this encounter.       Not applicable at this point since we have not taken over the patient's medication management yet. List of other Serum/Urine Drug Screening Test(s):  No results found. List of all UDS test(s) done:  No results found. Last UDS on record: No results found. UDS interpretation: No unexpected findings.          Medication Assessment Form: Patient introduced to form today Treatment compliance: Treatment may start today if patient agrees with proposed plan. Evaluation of compliance is not applicable at this point Risk Assessment Profile: Aberrant behavior: See initial evaluations. None observed or detected today Comorbid factors increasing risk of overdose: See initial evaluation. No additional risks detected today Opioid risk tool (ORT):  Opioid Risk  07/14/2018  Alcohol 0  Illegal Drugs 0  Rx Drugs 0  Alcohol 0  Illegal Drugs 0  Rx Drugs 0  Age between 16-45 years  0  History of Preadolescent Sexual Abuse 0  Psychological Disease 0  Depression 0  Opioid Risk Tool Scoring 0  Opioid Risk Interpretation Low Risk    ORT Scoring interpretation table:  Score <3 = Low Risk for SUD  Score between 4-7 = Moderate Risk for SUD  Score >8 = High Risk for Opioid Abuse   Risk of substance use disorder (SUD): Low  Risk Mitigation Strategies:  Patient opioid safety counseling: Completed today. Counseling provided to patient as per "Patient Counseling Document". Document signed by patient, attesting to counseling and understanding Patient-Prescriber Agreement (PPA): Obtained today.  Controlled substance notification to other providers: Written and sent today.  Pharmacologic Plan: Today we may be taking over the patient's pharmacological regimen. See below.              Meds   Current Outpatient Medications:  .  Cholecalciferol 1.25 MG (50000 UT) capsule, Take 1 capsule (50,000 Units total) by mouth once a week., Disp: 13 capsule, Rfl: 1 .  gabapentin (NEURONTIN) 300 MG capsule, 1 pill bid bid and 2 pills qhs (Patient taking differently: 2 pills qhs), Disp: 360 capsule, Rfl: 3  Laboratory Chemistry  Inflammation Markers (CRP: Acute Phase) (ESR: Chronic Phase) No results found.  Rheumatology Markers No results found.  Renal Function Markers Lab Results  Component Value Date   BUN 14 04/23/2018   CREATININE 0.69 40/12/2723   BCR NOT APPLICABLE 36/64/4034   GFRAA >60 10/06/2015   GFRNONAA >60 10/06/2015                             Hepatic Function Markers Lab Results  Component Value Date   AST  15 04/23/2018   ALT 18 04/23/2018   ALBUMIN 4.0 04/23/2018   ALKPHOS 86 04/23/2018                        Electrolytes Lab Results  Component Value Date   NA 138 04/23/2018   K 3.8 04/23/2018   CL 103 04/23/2018   CALCIUM 9.3 04/23/2018                        Neuropathy Markers Lab Results  Component Value Date   HGBA1C 6.8 (H) 04/23/2018                        CNS Tests No results found.  Bone Pathology Markers Lab Results  Component Value Date   VD25OH 15.62 (L) 04/23/2018                         Coagulation Parameters Lab Results  Component Value Date   PLT 326.0 04/23/2018                        Cardiovascular Markers Lab Results  Component Value Date   BNP 20 06/11/2011   CKTOTAL 111 06/11/2011   CKMB 0.8 06/11/2011   TROPONINI < 0.02 11/03/2013   HGB 15.2 (H) 04/23/2018   HCT 45.5 04/23/2018                         ID Markers No results found.  CA Markers No results found.  Endocrine Markers Lab Results  Component Value Date   TSH 0.93 08/28/2017   FREET4 1.1 08/28/2017                        Note: Lab results reviewed.  Recent Diagnostic Imaging Review  Lumbosacral Imaging: Lumbar MR wo  contrast:  Results for orders placed during the hospital encounter of 09/22/17  MR Lumbar Spine Wo Contrast   Narrative CLINICAL DATA:  History of chronic low back pain. Patient status post lumbar spine surgery June, 2017. Recurrence of low back pain with numbness and tingling in the left leg to the foot in November, 2018. No known injury.  EXAM: MRI LUMBAR SPINE WITHOUT CONTRAST  TECHNIQUE: Multiplanar, multisequence MR imaging of the lumbar spine was performed. No intravenous contrast was administered.  COMPARISON:  MRI lumbar spine 07/19/2015.  FINDINGS: Segmentation:  Standard.  Alignment:  Maintained.  Vertebrae:  Height and signal are normal  Conus medullaris and cauda equina: Conus extends to the L1 level. Conus and cauda equina appear normal.  Paraspinal and other soft tissues: Negative.  Disc levels:  T10-11 and T11-12 are imaged in the sagittal plane only and negative.  T12-L1: Mild facet arthropathy.  Otherwise negative.  L1-2: Negative.  L2-3: Negative.  L3-4: Negative.  L4-5: Negative.  L5-S1: Since the prior examination, the patient has undergone left laminotomy for discectomy. There is a broad-based left paracentral protrusion which deforms the left side of the thecal sac and deflects the descending left S1 root. The disc also causes some narrowing in the right lateral recess. Overall, the disc appears smaller than on the prior examination. The foramina are open.  IMPRESSION: Status post left laminotomy for discectomy at L5-S1. The patient has a broad-based left paracentral protrusion which is smaller than on the prior examination but continues  to deform the left side of the thecal sac and deflects the descending left S1 root. It also causes mild narrowing in the right lateral recess which could impact the right S1 root.   Electronically Signed   By: Inge Rise M.D.   On: 09/22/2017 10:18    Complexity Note: Imaging results  reviewed. Results shared with Ms. Kellie Simmering, using Layman's terms.                         Assessment  The primary encounter diagnosis was Chronic lower extremity pain (Primary area of Pain) (Left). Diagnoses of Lumbosacral radiculopathy (S1) (Left), Displacement of lumbar intervertebral disc (L5-S1) w/ radiculopathy (S1) (Left), Chronic low back pain (Secondary Area of Pain) (Left) w/ sciatica (Left), DDD (degenerative disc disease), lumbosacral, Failed back surgical syndrome, Lumbar facet arthropathy, and Nocturnal leg cramps (Bilateral) were also pertinent to this visit.  Plan of Care  I am having Effie Shy maintain her Cholecalciferol and gabapentin. Pharmacotherapy (Medications Ordered): No orders of the defined types were placed in this encounter.  Procedure Orders    No procedure(s) ordered today   Lab Orders  No laboratory test(s) ordered today   Imaging Orders  No imaging studies ordered today   Referral Orders  No referral(s) requested today    Orders:  No orders of the defined types were placed in this encounter.  Pharmacological management options:  Opioid Analgesics: We'll take over management today. See above orders Membrane stabilizer: Options discussed, including a trial. Muscle relaxant: We have discussed the possibility of a trial NSAID: Trial discussed. Other analgesic(s): To be determined at a later time   Interventional management options: Planned, scheduled, and/or pending:    ***   Considering:   Diagnostic left-sided caudal epidural steroid injection #1 + diagnostic epidurogram  Possible Racz procedure  Diagnostic left-sided L5 transforaminal epidural steroid injection    PRN Procedures:   None at this time   Total duration of non-face-to-face encounter: *** minutes.  Follow-up plan:   No follow-ups on file.    Future Appointments  Date Time Provider [place]  07/28/2018  8:15 AM Milinda Pointer, MD [facility] None  11/24/2018   9:00 AM McLean-Scocuzza, Nino Glow, MD [facility]    Primary Care Physician: McLean-Scocuzza, Nino Glow, MD Location: Telephone Virtual Visit Note by: Gaspar Cola, MD Date: 07/28/2018; Time: 7:15 PM  Disclaimer:  * Given the special circumstances of the COVID-19 pandemic, the federal government has announced that [employer] for Civil Rights ([employer]) will exercise its enforcement discretion and will not impose penalties on physicians using telehealth in the event of noncompliance with regulatory requirements under the [place] and Accountability Act (HIPAA) in connection with the good faith provision of telehealth during the MMHWK-08 national public health emergency. (AMA)

## 2018-07-28 ENCOUNTER — Ambulatory Visit: Payer: Self-pay | Admitting: Pain Medicine

## 2018-11-16 ENCOUNTER — Encounter: Payer: Self-pay | Admitting: Emergency Medicine

## 2018-11-16 ENCOUNTER — Other Ambulatory Visit: Payer: Self-pay

## 2018-11-16 ENCOUNTER — Emergency Department: Payer: Self-pay

## 2018-11-16 ENCOUNTER — Emergency Department
Admission: EM | Admit: 2018-11-16 | Discharge: 2018-11-17 | Disposition: A | Discharge status: Home / Self Care | Payer: Self-pay | Attending: Emergency Medicine | Admitting: Emergency Medicine

## 2018-11-16 DIAGNOSIS — F17210 Nicotine dependence, cigarettes, uncomplicated: Secondary | ICD-10-CM | POA: Insufficient documentation

## 2018-11-16 DIAGNOSIS — E119 Type 2 diabetes mellitus without complications: Secondary | ICD-10-CM | POA: Insufficient documentation

## 2018-11-16 DIAGNOSIS — R109 Unspecified abdominal pain: Secondary | ICD-10-CM

## 2018-11-16 DIAGNOSIS — R310 Gross hematuria: Secondary | ICD-10-CM | POA: Insufficient documentation

## 2018-11-16 DIAGNOSIS — N201 Calculus of ureter: Secondary | ICD-10-CM | POA: Insufficient documentation

## 2018-11-16 DIAGNOSIS — Z79899 Other long term (current) drug therapy: Secondary | ICD-10-CM | POA: Insufficient documentation

## 2018-11-16 MED ORDER — ONDANSETRON HCL 4 MG/2ML IJ SOLN
4.0000 mg | Freq: Once | INTRAMUSCULAR | Status: AC
  Administered 2018-11-17: 4 mg via INTRAVENOUS
  Filled 2018-11-16: qty 2

## 2018-11-16 MED ORDER — FENTANYL CITRATE (PF) 100 MCG/2ML IJ SOLN
50.0000 ug | Freq: Once | INTRAMUSCULAR | Status: AC
  Administered 2018-11-17: 50 ug via INTRAVENOUS
  Filled 2018-11-16: qty 2

## 2018-11-16 MED ORDER — SODIUM CHLORIDE 0.9 % IV BOLUS
1000.0000 mL | Freq: Once | INTRAVENOUS | Status: AC
  Administered 2018-11-17: 1000 mL via INTRAVENOUS

## 2018-11-16 NOTE — ED Triage Notes (Signed)
Pt to triage via w/c, appears uncomfortable; reports hematuria x 2 days; tonight began having rt flank pain radiating into rt lower abd; st hx kidney stones

## 2018-11-16 NOTE — ED Provider Notes (Signed)
[facility] Emergency Department Provider Note  ____________________________________________  Time seen: Approximately 11:45 PM  I have reviewed the triage vital signs and the nursing notes.   HISTORY  Chief Complaint Flank Pain    HPI KLOEI SCHWANDER is a 48 y.o. female with a history of GERD hyperlipidemia kidney stones who comes the ED complaining of right flank pain radiating around to the right abdomen, constant, waxing and waning for the past 24 hours.  Associated with nausea, no vomiting constipation or diarrhea.  No fevers chills or body aches.  No aggravating or alleviating factors.  Pain is severe.      Past Medical History:  Diagnosis Date  . Chicken pox   . GERD (gastroesophageal reflux disease)   . History of kidney stones    passed 2 , litrotrispy  . Hyperlipidemia   . Liver lesion    hemangioma vs FNH vs adenoma noted MRI 2008 (Dr. Allen Norris)   . Pneumonia   . Pre-diabetes   . Prediabetes   . Trigger finger of left thumb      Patient Active Problem List   Diagnosis Date Noted  . Sciatica (Left) 07/14/2018  . Chronic pain syndrome 07/14/2018  . Pharmacologic therapy 07/14/2018  . Disorder of skeletal system 07/14/2018  . Problems influencing health status 07/14/2018  . Failed back surgical syndrome 07/14/2018  . Chronic low back pain (Secondary Area of Pain) (Left) w/ sciatica (Left) 07/14/2018  . Chronic lower extremity pain (Primary area of Pain) (Left) 07/14/2018  . Lumbar facet arthropathy 07/14/2018  . DDD (degenerative disc disease), lumbosacral 07/14/2018  . Leg cramps (Bilateral) 07/14/2018  . Displacement of lumbar intervertebral disc (L5-S1) w/ radiculopathy (S1) (Left) 07/14/2018  . Lumbar spondylosis 07/14/2018  . Chronic musculoskeletal pain 07/14/2018  . Neurogenic pain 07/14/2018  . Abnormal MRI, lumbar spine 07/14/2018  . Nocturnal leg cramps (Bilateral) 06/22/2018  . Vitamin D deficiency 08/31/2017  . HLD  (hyperlipidemia) 08/31/2017  . DM2 (diabetes mellitus, type 2) ([place]) 08/31/2017  . Tobacco abuse 08/31/2017  . Polycythemia 08/31/2017  . Hot flashes 08/25/2017  . Lumbosacral radiculopathy (S1) (Left) 08/25/2017     Past Surgical History:  Procedure Laterality Date  . ABDOMINAL HYSTERECTOMY     2006/2007 for endometriosis   . BACK SURGERY     L4/5 discectomy 09/18/2015 Dr. Lynann Bologna   . breast reduction     1994  . EXTRACORPOREAL SHOCK WAVE LITHOTRIPSY Right 06/07/2015   Procedure: EXTRACORPOREAL SHOCK WAVE LITHOTRIPSY (ESWL);  Surgeon: Royston Cowper, MD;  Location: [facility];  Service: Urology;  Laterality: Right;  . RADIOLOGY WITH ANESTHESIA N/A 07/19/2015   Procedure: MRI LUMBER SPIN WITHOUT;  Surgeon: Medication Radiologist, MD;  Location: [place];  Service: Radiology;  Laterality: N/A;  . RADIOLOGY WITH ANESTHESIA N/A 09/22/2017   Procedure: MRI LUMBAR SPINE WITHOUT CONTRAST;  Surgeon: Radiologist, Medication, MD;  Location: [place];  Service: Radiology;  Laterality: N/A;  . SHOULDER OPEN ROTATOR CUFF REPAIR Bilateral 2007 2008   right 2007, left 2008   . TRIGGER FINGER RELEASE Left 02/18/2016   Procedure: LEFT TRIGGER THUMB RELEASE;  Surgeon: Milly Jakob, MD;  Location: [place];  Service: Orthopedics;  Laterality: Left;  . WISDOM TOOTH EXTRACTION       Prior to Admission medications   Medication Sig Start Date End Date Taking? Authorizing Provider  Cholecalciferol 1.25 MG (50000 UT) capsule Take 1 capsule (50,000 Units total) by mouth once a week. 04/23/18   McLean-Scocuzza, Olivia Mackie  N, MD  gabapentin (NEURONTIN) 300 MG capsule 1 pill bid bid and 2 pills qhs Patient taking differently: 2 pills qhs 06/22/18   McLean-Scocuzza, Nino Glow, MD  ketorolac (TORADOL) 10 MG tablet Take 1 tablet (10 mg total) by mouth every 6 (six) hours as needed for moderate pain. 11/17/18   Carrie Mew, MD  ondansetron (ZOFRAN ODT) 4 MG disintegrating tablet Take 1 tablet (4 mg total)  by mouth every 8 (eight) hours as needed for nausea or vomiting. 11/17/18   Carrie Mew, MD  oxyCODONE-acetaminophen (PERCOCET) 5-325 MG tablet Take 1 tablet by mouth every 6 (six) hours as needed for severe pain. 11/17/18 11/17/19  Carrie Mew, MD  tamsulosin (FLOMAX) 0.4 MG CAPS capsule Take 1 capsule (0.4 mg total) by mouth daily for 10 days. Discontinue after symptoms improve 11/17/18 11/27/18  Carrie Mew, MD     Allergies Patient has no known allergies.   Family History  Problem Relation Age of Onset  . Arthritis Mother   . Early death Mother   . Heart disease Mother        MVP with repair died from anestheisa   . Hyperlipidemia Mother   . Hypertension Mother   . Kidney disease Mother   . Early death Father   . Diabetes Father   . Hyperlipidemia Father   . Heart disease Father   . Hypertension Father   . Kidney disease Father   . Asthma Daughter   . Depression Daughter   . Diabetes Maternal Grandmother   . Heart disease Maternal Grandmother   . Heart disease Maternal Grandfather   . Early death Paternal Grandmother   . Diabetes Paternal Grandmother   . Cancer Paternal Grandfather        prostate     Social History Social History   Tobacco Use  . Smoking status: Current Every Day Smoker    Packs/day: 0.50    Years: 20.00    Pack years: 10.00    Types: Cigarettes  . Smokeless tobacco: Never Used  Substance Use Topics  . Alcohol use: No  . Drug use: No    Review of Systems  Constitutional:   No fever or chills.  ENT:   No sore throat. No rhinorrhea. Cardiovascular:   No chest pain or syncope. Respiratory:   No dyspnea or cough. Gastrointestinal: Positive as above for right-sided flank and abdominal pain without vomiting and diarrhea.  Musculoskeletal:   Negative for focal pain or swelling All other systems reviewed and are negative except as documented above in ROS and HPI.  ____________________________________________   PHYSICAL  EXAM:  VITAL SIGNS: ED Triage Vitals  Enc Vitals Group     BP 11/16/18 2321 117/86     Pulse Rate 11/16/18 2321 75     Resp 11/16/18 2321 18     Temp 11/16/18 2321 98.2 F (36.8 C)     Temp Source 11/16/18 2321 Oral     SpO2 11/16/18 2321 97 %     Weight --      Height --      Head Circumference --      Peak Flow --      Pain Score 11/16/18 2316 8     Pain Loc --      Pain Edu? --      Excl. in [place]? --     Vital signs reviewed, nursing assessments reviewed.   Constitutional:   Alert and oriented. Non-toxic appearance. Eyes:   Conjunctivae are normal. EOMI.  PERRL. ENT      Head:   Normocephalic and atraumatic.         Neck:   No meningismus. Full ROM. Hematological/Lymphatic/Immunilogical:   No cervical lymphadenopathy. Cardiovascular:   RRR. Symmetric bilateral radial and DP pulses.  No murmurs. Cap refill less than 2 seconds.  No pulsatile abdominal mass or bruit Respiratory:   Normal respiratory effort without tachypnea/retractions. Breath sounds are clear and equal bilaterally. No wheezes/rales/rhonchi. Gastrointestinal:   Soft with tenderness in the epigastrium right upper quadrant and right mid abdomen.  No tenderness to McBurney's point.. Non distended. There is no CVA tenderness.  No rebound, rigidity, or guarding.  Musculoskeletal:   Normal range of motion in all extremities. No joint effusions.  No lower extremity tenderness.  No edema. Neurologic:   Normal speech and language.  Motor grossly intact. No acute focal neurologic deficits are appreciated.  Skin:    Skin is warm, dry and intact. No rash noted.  No petechiae, purpura, or bullae.  ____________________________________________    LABS (pertinent positives/negatives) (all labs ordered are listed, but only abnormal results are displayed) Labs Reviewed  CBC WITH DIFFERENTIAL/PLATELET - Abnormal; Notable for the following components:      Result Value   Lymphs Abs 4.1 (*)    Monocytes Absolute 1.6 (*)     All other components within normal limits  URINALYSIS, COMPLETE (UACMP) WITH MICROSCOPIC - Abnormal; Notable for the following components:   Color, Urine YELLOW (*)    APPearance CLOUDY (*)    Hgb urine dipstick LARGE (*)    Protein, ur 100 (*)    RBC / HPF >50 (*)    All other components within normal limits  URINE CULTURE  COMPREHENSIVE METABOLIC PANEL  LIPASE, BLOOD   ____________________________________________   EKG    ____________________________________________    RADIOLOGY  Ct Renal Stone Study  Addendum Date: 11/17/2018   ADDENDUM REPORT: 11/17/2018 01:34 ADDENDUM: Correction to report. Vague hypodense mass within the dome of the liver measuring approximately 4.1 cm, grossly stable as compared with 2017 and corresponding to enhancing mass from 2008 CT, possibly hemangioma. Electronically Signed   By: Donavan Foil M.D.   On: 11/17/2018 01:34   Result Date: 11/17/2018 CLINICAL DATA:  Hematuria and right flank pain EXAM: CT ABDOMEN AND PELVIS WITHOUT CONTRAST TECHNIQUE: Multidetector CT imaging of the abdomen and pelvis was performed following the standard protocol without IV contrast. COMPARISON:  CT 10/06/2015 FINDINGS: Lower chest: Lung bases demonstrate no acute consolidation or effusion. The heart size is normal Hepatobiliary: No focal liver abnormality is seen. No gallstones, gallbladder wall thickening, or biliary dilatation. Pancreas: Unremarkable. No pancreatic ductal dilatation or surrounding inflammatory changes. Spleen: Normal in size without focal abnormality. Adrenals/Urinary Tract: Adrenal glands are normal. Punctate stones within the mid right kidney. Mild right hydronephrosis, secondary to a 4 mm stone in the proximal right ureter just past the UPJ. The bladder is unremarkable Stomach/Bowel: Stomach is within normal limits. Appendix appears normal. No evidence of bowel wall thickening, distention, or inflammatory changes. Vascular/Lymphatic: Mild aortic  atherosclerosis. No aneurysm. No significantly enlarged lymph nodes Reproductive: Status post hysterectomy. No adnexal masses. Other: Negative for free air or free fluid Musculoskeletal: Degenerative changes at L5-S1 IMPRESSION: 1. Mild right hydronephrosis and proximal hydroureter, secondary to a 4 mm stone in the proximal right ureter just past the right UPJ 2. Additional small stones in the right kidney Electronically Signed: By: Donavan Foil M.D. On: 11/17/2018 00:26    ____________________________________________  PROCEDURES Procedures  ____________________________________________  DIFFERENTIAL DIAGNOSIS   Ureterolithiasis, cystitis, pyelonephritis, biliary colic, pancreatitis  CLINICAL IMPRESSION / ASSESSMENT AND PLAN / ED COURSE  Medications ordered in the ED: Medications  oxyCODONE-acetaminophen (PERCOCET/ROXICET) 5-325 MG per tablet 1 tablet (has no administration in time range)  sodium chloride 0.9 % bolus 1,000 mL (0 mLs Intravenous Stopped 11/17/18 0132)  ondansetron (ZOFRAN) injection 4 mg (4 mg Intravenous Given 11/17/18 0001)  fentaNYL (SUBLIMAZE) injection 50 mcg (50 mcg Intravenous Given 11/17/18 0001)  ketorolac (TORADOL) 30 MG/ML injection 15 mg (15 mg Intravenous Given 11/17/18 0042)  tamsulosin (FLOMAX) capsule 0.4 mg (0.4 mg Oral Given 11/17/18 0042)    Pertinent labs & imaging results that were available during my care of the patient were reviewed by me and considered in my medical decision making (see chart for details).  ORTENSIA MOTZER was evaluated in Emergency Department on 11/17/2018 for the symptoms described in the history of present illness. She was evaluated in the context of the global COVID-19 pandemic, which necessitated consideration that the patient might be at risk for infection with the SARS-CoV-2 virus that causes COVID-19. Institutional protocols and algorithms that pertain to the evaluation of patients at risk for COVID-19 are in a state of rapid  change based on information released by regulatory bodies including the CDC and federal and state organizations. These policies and algorithms were followed during the patient's care in the ED.   Patient presents with right-sided abdominal pain, most likely renal colic.  Vital signs are normal, nontoxic.  Doubt mesenteric ischemia dissection AAA ovarian cyst torsion.  Due to tenderness and location, differential is broad, will need to obtain a CT scan in addition to labs will giving IV fluids for hydration, fentanyl 50 mcg IV for pain control, Zofran 4 mg IV for nausea control.  Clinical Course as of Nov 16 140  Wed Nov 17, 2018  0032 UA does not show signs of infection.  CT shows 4 mm stone at the right UPJ with some proximal hydronephrosis, consistent with her clinical presentation.   [PS]    Clinical Course User Index [PS] Carrie Mew, MD    ----------------------------------------- 1:42 AM on 11/17/2018 -----------------------------------------  Labs unremarkable without evidence of renal failure or urinary tract infection.  Symptoms are much improved, patient feels comfortable and manageable at this time.  Results discussed with the patient, she agrees with discharge home and outpatient follow-up with symptomatic management in the meantime.  Return precautions discussed.   ____________________________________________   FINAL CLINICAL IMPRESSION(S) / ED DIAGNOSES    Final diagnoses:  Ureterolithiasis  Gross hematuria  Right flank pain     ED Discharge Orders         Ordered    ketorolac (TORADOL) 10 MG tablet  Every 6 hours PRN     11/17/18 0141    oxyCODONE-acetaminophen (PERCOCET) 5-325 MG tablet  Every 6 hours PRN     11/17/18 0141    tamsulosin (FLOMAX) 0.4 MG CAPS capsule  Daily     11/17/18 0141    ondansetron (ZOFRAN ODT) 4 MG disintegrating tablet  Every 8 hours PRN     11/17/18 0141          Portions of this note were generated with dragon  dictation software. Dictation errors may occur despite best attempts at proofreading.   Carrie Mew, MD 11/17/18 [phone]

## 2018-11-17 LAB — URINALYSIS, COMPLETE (UACMP) WITH MICROSCOPIC
Bacteria, UA: NONE SEEN
Bilirubin Urine: NEGATIVE
Glucose, UA: NEGATIVE mg/dL
Ketones, ur: NEGATIVE mg/dL
Leukocytes,Ua: NEGATIVE
Nitrite: NEGATIVE
Protein, ur: 100 mg/dL — AB
RBC / HPF: >50 RBC/hpf — ABNORMAL HIGH (ref 0–5)
Specific Gravity, Urine: 1.023 (ref 1.005–1.030)
WBC, UA: NONE SEEN WBC/hpf (ref 0–5)
pH: 6 (ref 5.0–8.0)

## 2018-11-17 LAB — COMPREHENSIVE METABOLIC PANEL
ALT: 20 U/L (ref 0–44)
AST: 23 U/L (ref 15–41)
Albumin: 3.8 g/dL (ref 3.5–5.0)
Alkaline Phosphatase: 77 U/L (ref 38–126)
Anion gap: 9 (ref 5–15)
BUN: 17 mg/dL (ref 6–20)
CO2: 24 mmol/L (ref 22–32)
Calcium: 9 mg/dL (ref 8.9–10.3)
Chloride: 104 mmol/L (ref 98–111)
Creatinine, Ser: 0.85 mg/dL (ref 0.44–1.00)
GFR calc Af Amer: >60 mL/min (ref >60)
GFR calc non Af Amer: >60 mL/min (ref >60)
Glucose, Bld: 92 mg/dL (ref 70–99)
Potassium: 4.2 mmol/L (ref 3.5–5.1)
Sodium: 137 mmol/L (ref 135–145)
Total Bilirubin: 0.9 mg/dL (ref 0.3–1.2)
Total Protein: 7.3 g/dL (ref 6.5–8.1)

## 2018-11-17 LAB — CBC WITH DIFFERENTIAL/PLATELET
Abs Immature Granulocytes: 0.05 10*3/uL (ref 0.00–0.07)
Basophils Absolute: 0.1 10*3/uL (ref 0.0–0.1)
Basophils Relative: 1 %
Eosinophils Absolute: 0.1 10*3/uL (ref 0.0–0.5)
Eosinophils Relative: 1 %
HCT: 43.1 % (ref 36.0–46.0)
Hemoglobin: 14.6 g/dL (ref 12.0–15.0)
Immature Granulocytes: 1 %
Lymphocytes Relative: 41 %
Lymphs Abs: 4.1 10*3/uL — ABNORMAL HIGH (ref 0.7–4.0)
MCH: 29.5 pg (ref 26.0–34.0)
MCHC: 33.9 g/dL (ref 30.0–36.0)
MCV: 87.1 fL (ref 80.0–100.0)
Monocytes Absolute: 1.6 10*3/uL — ABNORMAL HIGH (ref 0.1–1.0)
Monocytes Relative: 16 %
Neutro Abs: 3.8 10*3/uL (ref 1.7–7.7)
Neutrophils Relative %: 40 %
Platelets: 317 10*3/uL (ref 150–400)
RBC: 4.95 MIL/uL (ref 3.87–5.11)
RDW: 14.2 % (ref 11.5–15.5)
WBC: 9.6 10*3/uL (ref 4.0–10.5)
nRBC: 0 % (ref 0.0–0.2)

## 2018-11-17 LAB — LIPASE, BLOOD: Lipase: 30 U/L (ref 11–51)

## 2018-11-17 MED ORDER — ONDANSETRON 4 MG PO TBDP: 4.0000 mg | ORAL_TABLET | Freq: Three times a day (TID) | ORAL | 0 refills | Status: DC | PRN

## 2018-11-17 MED ORDER — KETOROLAC TROMETHAMINE 30 MG/ML IJ SOLN
15.0000 mg | INTRAMUSCULAR | Status: AC
  Administered 2018-11-17: 15 mg via INTRAVENOUS
  Filled 2018-11-17: qty 1

## 2018-11-17 MED ORDER — TAMSULOSIN HCL 0.4 MG PO CAPS
0.4000 mg | ORAL_CAPSULE | ORAL | Status: AC
  Administered 2018-11-17: 0.4 mg via ORAL
  Filled 2018-11-17: qty 1

## 2018-11-17 MED ORDER — TAMSULOSIN HCL 0.4 MG PO CAPS: 0.4000 mg | ORAL_CAPSULE | Freq: Every day | ORAL | 0 refills | Status: AC

## 2018-11-17 MED ORDER — KETOROLAC TROMETHAMINE 10 MG PO TABS: 10.0000 mg | ORAL_TABLET | Freq: Four times a day (QID) | ORAL | 0 refills | Status: DC | PRN

## 2018-11-17 MED ORDER — OXYCODONE-ACETAMINOPHEN 5-325 MG PO TABS: 1.0000 | ORAL_TABLET | Freq: Four times a day (QID) | ORAL | 0 refills | Status: DC | PRN

## 2018-11-17 MED ORDER — OXYCODONE-ACETAMINOPHEN 5-325 MG PO TABS
1.0000 | ORAL_TABLET | Freq: Once | ORAL | Status: AC
  Administered 2018-11-17: 1 via ORAL
  Filled 2018-11-17: qty 1

## 2018-11-17 NOTE — Discharge Instructions (Signed)
°  Ct Renal Stone Study  Addendum Date: 11/17/2018   ADDENDUM REPORT: 11/17/2018 01:34 ADDENDUM: Correction to report. Vague hypodense mass within the dome of the liver measuring approximately 4.1 cm, grossly stable as compared with 2017 and corresponding to enhancing mass from 2008 CT, possibly hemangioma. Electronically Signed   By: Donavan Foil M.D.   On: 11/17/2018 01:34   Result Date: 11/17/2018 CLINICAL DATA:  Hematuria and right flank pain EXAM: CT ABDOMEN AND PELVIS WITHOUT CONTRAST TECHNIQUE: Multidetector CT imaging of the abdomen and pelvis was performed following the standard protocol without IV contrast. COMPARISON:  CT 10/06/2015 FINDINGS: Lower chest: Lung bases demonstrate no acute consolidation or effusion. The heart size is normal Hepatobiliary: No focal liver abnormality is seen. No gallstones, gallbladder wall thickening, or biliary dilatation. Pancreas: Unremarkable. No pancreatic ductal dilatation or surrounding inflammatory changes. Spleen: Normal in size without focal abnormality. Adrenals/Urinary Tract: Adrenal glands are normal. Punctate stones within the mid right kidney. Mild right hydronephrosis, secondary to a 4 mm stone in the proximal right ureter just past the UPJ. The bladder is unremarkable Stomach/Bowel: Stomach is within normal limits. Appendix appears normal. No evidence of bowel wall thickening, distention, or inflammatory changes. Vascular/Lymphatic: Mild aortic atherosclerosis. No aneurysm. No significantly enlarged lymph nodes Reproductive: Status post hysterectomy. No adnexal masses. Other: Negative for free air or free fluid Musculoskeletal: Degenerative changes at L5-S1 IMPRESSION: 1. Mild right hydronephrosis and proximal hydroureter, secondary to a 4 mm stone in the proximal right ureter just past the right UPJ 2. Additional small stones in the right kidney Electronically Signed: By: Donavan Foil M.D. On: 11/17/2018 00:26

## 2018-11-17 NOTE — ED Notes (Signed)
Iv started  meds given.   

## 2018-11-17 NOTE — ED Notes (Signed)
Patient transported to CT 

## 2018-11-17 NOTE — ED Notes (Signed)
Pt has hematuria and right flank pain   Pt has nausea.  Hx kidney stones.  Sx for 2 days.  Worse tonight.  Iv started and meds given.    Family with pt

## 2018-11-18 LAB — URINE CULTURE: Culture: 30000 — AB

## 2018-11-24 ENCOUNTER — Ambulatory Visit: Payer: Self-pay | Admitting: Internal Medicine

## 2018-12-14 ENCOUNTER — Encounter: Payer: Self-pay | Admitting: Internal Medicine

## 2019-05-11 DIAGNOSIS — M545 Low back pain: Secondary | ICD-10-CM | POA: Diagnosis not present

## 2019-05-20 DIAGNOSIS — M79641 Pain in right hand: Secondary | ICD-10-CM | POA: Diagnosis not present

## 2019-05-20 DIAGNOSIS — M65311 Trigger thumb, right thumb: Secondary | ICD-10-CM | POA: Diagnosis not present

## 2019-05-24 ENCOUNTER — Encounter: Payer: Self-pay | Admitting: Pain Medicine

## 2019-05-24 NOTE — Progress Notes (Signed)
Patient: Lori Lang  Service Category: E/M  Provider: Gaspar Cola, MD  DOB: January 20, 1971  DOS: 05/25/2019  Location: Office  MRN: [id number]  Setting: Ambulatory outpatient  Referring Provider: McLean-Scocuzza, Olivia Mackie *  Type: Established Patient  Specialty: Interventional Pain Management  PCP: McLean-Scocuzza, Nino Glow, MD  Location: Remote location  Delivery: TeleHealth     Virtual Encounter - Pain Management PROVIDER NOTE: Information contained herein reflects review and annotations entered in association with encounter. Interpretation of such information and data should be left to medically-trained personnel. Information provided to patient can be located elsewhere in the medical record under "Patient Instructions". Document created using STT-dictation technology, any transcriptional errors that may result from process are unintentional.    Contact & Pharmacy Preferred: [phone] Home: [phone] (home) Mobile: [phone] (mobile) E-mail: [email or web address]  [id number] [address], [address] [place] [address] Phone: [phone] Fax: [phone]   Pre-screening note:  Our staff contacted Lori Lang and offered her an "in person", "face-to-face" appointment versus a telephone encounter. She indicated preferring the telephone encounter, at this time.   Primary Reason(s) for Virtual Visit: Encounter for evaluation before starting new chronic pain management plan of care (Level of risk: moderate) COVID-19*  Social distancing based on CDC ans AMA recommendations.    I contacted Lori Lang on 05/25/2019 via telephone.      I clearly identified myself as Gaspar Cola, MD. I verified that I was speaking with the correct person using two identifiers (Name: Lori Lang, and date of birth: 07-Apr-1970).  Advanced Informed Consent I sought verbal advanced consent from Lori Lang  for virtual visit interactions. I informed Lori Lang of possible security and privacy concerns, risks, and limitations associated with providing "not-in-person" medical evaluation and management services. I also informed Lori Lang of the availability of "in-person" appointments. Finally, I informed her that there would be a charge for the virtual visit and that she could be  personally, fully or partially, financially responsible for it. Lori Lang expressed understanding and agreed to proceed.   Historic Elements   Lori Lang is a 49 y.o. year old, female patient evaluated today after her last encounter by our practice on Visit date not found. Lori Lang  has a past medical history of Chicken pox, GERD (gastroesophageal reflux disease), History of kidney stones, Hyperlipidemia, Liver lesion, Pneumonia, Pre-diabetes, Prediabetes, and Trigger finger of left thumb. She also  has a past surgical history that includes Extracorporeal shock wave lithotripsy (Right, 06/07/2015); Shoulder open rotator cuff repair (Bilateral, 2007 2008); Wisdom tooth extraction; Radiology with anesthesia (N/A, 07/19/2015); Trigger finger release (Left, 02/18/2016); Back surgery; breast reduction; Abdominal hysterectomy; and Radiology with anesthesia (N/A, 09/22/2017). Lori Lang has a current medication list which includes the following prescription(s): calcium carbonate, vitamin d3, [START ON 05/26/2019] ergocalciferol, gabapentin, and magnesium. She  reports that she has been smoking cigarettes. She has a 10.00 pack-year smoking history. She has never used smokeless tobacco. She reports that she does not drink alcohol or use drugs. Lori Lang has No Known Allergies.   HPI  She is being evaluated for review of studies ordered on initial visit and to consider treatment plan options. Today I went over the results of her tests. These were explained in "Layman's terms". During today's appointment I went over my diagnostic impression,  as well as the proposed treatment plan.  According  to the patient her primary area of pain is that of the left lower extremity (LLEP) where the pain goes all the way down into the bottom of the foot and smaller toes on the lateral aspect of the foot, traveling to the back of the leg. (S1)  When asked to perform toe walking and/or standing she indicated having difficulty doing that with the left leg compared to the right.  The patient was asked if this was secondary to pain or weakness and she indicated that it was secondary to weakness of the leg.  The patient denies experiencing any type of pain on the right lower extremity, but does admit to bilateral nocturnal cramps in the area of her calves and occasionally the thighs.  She denies any trauma to the lower extremities, any surgical procedures in the legs, injections or x-rays of the lower extremity.  She does admit having had some back surgery done in June 2017 by Dr. Lynann Bologna at [facility]  The patient indicates no postoperative complications and she also indicates that this completely eliminated her lower extremity pain for over a year.  After about a year, she began to experience a gradual onset and recurrence of the pain.  This pain is been described by the patient as being identical in terms of its location, to the pain that she had before the surgery.  During the year that she spent without any pain, she indicates not having been taking any pain medicine.  As of now, the patient indicates that her leg pain pattern is identical to the one she had before her surgery.  The only difference is at this time this is hurting more than before the surgery.  The patient indicates having had physical therapy after the surgery which did seem to help at the time.  In terms of this leg pain she indicates that the worst part is in the left buttocks area, followed by the back of her thigh in the hamstring area, followed by the calf region.  The patient's  secondary area of pain is that of the lower back on the left side (LLBP) with some occasional involvement of the midline.  She denies any pain on the right side of the lower back or the right leg.  When comparing the lower extremity pain in the low back pain she describes the left lower extremity pain to be a lot worse than the low back pain.  As previously stated the patient underwent a lumbar hemilaminectomy on the left side around June 2017, by Dr. Lynann Bologna.  She also admits to having had 1 lumbar epidural steroid injection done at [facility] and her him, before her back surgery.  She describes having had complete relief of the pain for the duration of the local anesthetic, followed by recurrence of the pain and then a second wave of relief 2 weeks later which lasted for approximately a month.  However, when she went back to [facility] to describe the results of her epidural, she mentioned that the pain was still there in the leg in that it did not help.  Apparently they took this at face value and sent the patient to have a back surgery.  Currently the patient describes managing her pain with gabapentin 300 mg 2 tablets p.o. at bedtime.  She describes no side effects or problems with the use of the gabapentin.  Today I took the time to review the patient's last lumbar MRI as well as her lab work.  She has evidence of a vitamin D deficiency, which to a certain extent could be contributing to her inability to sleep at night.  She describes that her insomnia is secondary to her pain.  Today we reviewed the patient's problems and they have not changed from her initial evaluation on 07/14/2018.  Apparently she was not able to follow-up with the orders that I had put in, do to a loss of insurance secondary to her husband losing his job.  Apparently he is now back working and they again have access to insurance.  Therefore, we will simply resume the original plan.  In considering the  treatment plan options, Lori Lang was reminded that I no longer take patients for medication management only. I asked her to let me know if she had no intention of taking advantage of the interventional therapies, so that we could make arrangements to provide this space to someone interested. I also made it clear that undergoing interventional therapies for the purpose of getting pain medications is very inappropriate on the part of a patient, and it will not be tolerated in this practice. This type of behavior would suggest true addiction and therefore it requires referral to an addiction specialist.   I discussed the assessment and treatment plan with the patient. The patient was provided an opportunity to ask questions and all were answered. The patient agreed with the plan and demonstrated an understanding of the instructions.  Patient advised to call back or seek an in-person evaluation if the symptoms or condition worsens.  Controlled Substance Pharmacotherapy Assessment REMS (Risk Evaluation and Mitigation Strategy)  Analgesic: No opioid analgesics prescribed by our practice.   Monitoring: [place] PMP: PDMP reviewed during this encounter.       Not applicable at this point since we have not taken over the patient's medication management yet. List of other Serum/Urine Drug Screening Test(s):  No results found. List of all UDS test(s) done:  No results found. Last UDS on record: No results found. UDS interpretation: No unexpected findings.          Medication Assessment Form: Patient introduced to form today Treatment compliance: Treatment may start today if patient agrees with proposed plan. Evaluation of compliance is not applicable at this point Risk Assessment Profile: Aberrant behavior: See initial evaluations. None observed or detected today Comorbid factors increasing risk of overdose: See initial evaluation. No additional risks detected today Opioid risk tool (ORT):  Opioid Risk   07/14/2018  Alcohol 0  Illegal Drugs 0  Rx Drugs 0  Alcohol 0  Illegal Drugs 0  Rx Drugs 0  Age between 16-45 years  0  History of Preadolescent Sexual Abuse 0  Psychological Disease 0  Depression 0  Opioid Risk Tool Scoring 0  Opioid Risk Interpretation Low Risk    ORT Scoring interpretation table:  Score <3 = Low Risk for SUD  Score between 4-7 = Moderate Risk for SUD  Score >8 = High Risk for Opioid Abuse   Risk of substance use disorder (SUD): Low  Risk Mitigation Strategies:  Patient opioid safety counseling: Completed today. Counseling provided to patient as per "Patient Counseling Document". Document signed by patient, attesting to counseling and understanding Patient-Prescriber Agreement (PPA): Obtained today.  Controlled substance notification to other providers: Written and sent today.  Pharmacologic Plan: Today we may be taking over the patient's pharmacological regimen. See below.             Meds   Current Outpatient Medications:  .  calcium carbonate (CALCIUM 600) 600 MG TABS tablet, Take 2 tablets (1,200 mg total) by mouth daily with breakfast., Disp: 60 tablet, Rfl: 5 .  Cholecalciferol (VITAMIN D3) 125 MCG (5000 UT) CAPS, Take 1 capsule (5,000 Units total) by mouth daily with breakfast. Take along with calcium and magnesium., Disp: 30 capsule, Rfl: 5 .  [START ON 05/26/2019] ergocalciferol (VITAMIN D2) 1.25 MG (50000 UT) capsule, Take 1 capsule (50,000 Units total) by mouth 2 (two) times a week. X 6 weeks., Disp: 12 capsule, Rfl: 0 .  gabapentin (NEURONTIN) 300 MG capsule, Take 1 capsule (300 mg total) by mouth at bedtime for 14 days, THEN 2 capsules (600 mg total) at bedtime for 14 days, THEN 3 capsules (900 mg total) at bedtime for 14 days. 1 pill bid bid and 2 pills qhs., Disp: 84 capsule, Rfl: 0 .  Magnesium 500 MG CAPS, Take 1 capsule (500 mg total) by mouth 2 (two) times daily at 8 am and 10 pm., Disp: 60 capsule, Rfl: 5  Laboratory Chemistry Profile    Renal Lab Results  Component Value Date   BUN 17 11/16/2018   CREATININE 0.85 02/77/4128   BCR NOT APPLICABLE 78/67/6720   GFR 109.34 04/23/2018   GFRAA >60 11/16/2018   GFRNONAA >60 11/16/2018   SPECGRAV 1.026 08/28/2017   PHUR 5.5 08/28/2017   PROTEINUR 100 (A) 11/16/2018    Electrolytes Lab Results  Component Value Date   NA 137 11/16/2018   K 4.2 11/16/2018   CL 104 11/16/2018   CALCIUM 9.0 11/16/2018    Hepatic Lab Results  Component Value Date   AST 23 11/16/2018   ALT 20 11/16/2018   ALBUMIN 3.8 11/16/2018   ALKPHOS 77 11/16/2018   LIPASE 30 11/16/2018    ID No results found.  Bone Lab Results  Component Value Date   VD25OH 15.62 (L) 04/23/2018    Endocrine Lab Results  Component Value Date   GLUCOSE 92 11/16/2018   GLUCOSEU NEGATIVE 11/16/2018   HGBA1C 6.8 (H) 04/23/2018   TSH 0.93 08/28/2017   FREET4 1.1 08/28/2017    Neuropathy Lab Results  Component Value Date   HGBA1C 6.8 (H) 04/23/2018    CNS No results found.  Inflammation (CRP: Acute  ESR: Chronic) No results found.   Rheumatology No results found.  Coagulation Lab Results  Component Value Date   PLT 317 11/16/2018    Cardiovascular Lab Results  Component Value Date   BNP 20 06/11/2011   CKTOTAL 111 06/11/2011   CKMB 0.8 06/11/2011   TROPONINI < 0.02 11/03/2013   HGB 14.6 11/16/2018   HCT 43.1 11/16/2018    Screening No results found.   Cancer No results found.  Allergens No results found.     Note: Lab results reviewed.   Recent Diagnostic Imaging Review  Lumbosacral Imaging: Lumbar MR wo contrast:  Results for orders placed during the hospital encounter of 09/22/17  MR Lumbar Spine Wo Contrast   Narrative CLINICAL DATA:  History of chronic low back pain. Patient status post lumbar spine surgery June, 2017. Recurrence of low back pain with numbness and tingling in the left leg to the foot in November, 2018. No known injury.  EXAM: MRI LUMBAR SPINE WITHOUT  CONTRAST  TECHNIQUE: Multiplanar, multisequence MR imaging of the lumbar spine was performed. No intravenous contrast was administered.  COMPARISON:  MRI lumbar spine 07/19/2015.  FINDINGS: Segmentation:  Standard.  Alignment:  Maintained.  Vertebrae:  Height and signal are normal  Conus medullaris  and cauda equina: Conus extends to the L1 level. Conus and cauda equina appear normal.  Paraspinal and other soft tissues: Negative.  Disc levels:  T10-11 and T11-12 are imaged in the sagittal plane only and negative.  T12-L1: Mild facet arthropathy.  Otherwise negative.  L1-2: Negative.  L2-3: Negative.  L3-4: Negative.  L4-5: Negative.  L5-S1: Since the prior examination, the patient has undergone left laminotomy for discectomy. There is a broad-based left paracentral protrusion which deforms the left side of the thecal sac and deflects the descending left S1 root. The disc also causes some narrowing in the right lateral recess. Overall, the disc appears smaller than on the prior examination. The foramina are open.  IMPRESSION: Status post left laminotomy for discectomy at L5-S1. The patient has a broad-based left paracentral protrusion which is smaller than on the prior examination but continues to deform the left side of the thecal sac and deflects the descending left S1 root. It also causes mild narrowing in the right lateral recess which could impact the right S1 root.   Electronically Signed   By: Inge Rise M.D.   On: 09/22/2017 10:18    Complexity Note: Imaging results reviewed. Results shared with Lori Lang, using Layman's terms.                        Assessment  The primary encounter diagnosis was Lumbar radiculopathy. Diagnoses of Chronic lower extremity pain (Primary area of Pain) (Left), Displacement of lumbar intervertebral disc (L5-S1) w/ radiculopathy (S1) (Left), Failed back surgical syndrome, Chronic low back pain (Secondary Area of  Pain) (Left) w/ sciatica (Left), DDD (degenerative disc disease), lumbosacral, Neurogenic pain, Chronic pain syndrome, Pharmacologic therapy, Disorder of skeletal system, Problems influencing health status, and Vitamin D deficiency were also pertinent to this visit.  Plan of Care  I have discontinued Lori Lang's Cholecalciferol, ketorolac, oxyCODONE-acetaminophen, and ondansetron. I have also changed her gabapentin. Additionally, I am having her start on ergocalciferol, Vitamin D3, Magnesium, and calcium carbonate. Pharmacotherapy (Medications Ordered): Meds ordered this encounter  Medications  . gabapentin (NEURONTIN) 300 MG capsule    Sig: Take 1 capsule (300 mg total) by mouth at bedtime for 14 days, THEN 2 capsules (600 mg total) at bedtime for 14 days, THEN 3 capsules (900 mg total) at bedtime for 14 days. 1 pill bid bid and 2 pills qhs.    Dispense:  84 capsule    Refill:  0    Fill one day early if pharmacy is closed on scheduled refill date. May substitute for generic if available.  . ergocalciferol (VITAMIN D2) 1.25 MG (50000 UT) capsule    Sig: Take 1 capsule (50,000 Units total) by mouth 2 (two) times a week. X 6 weeks.    Dispense:  12 capsule    Refill:  0    Fill one day early if pharmacy is closed on scheduled refill date. May substitute for generic if available.  . Cholecalciferol (VITAMIN D3) 125 MCG (5000 UT) CAPS    Sig: Take 1 capsule (5,000 Units total) by mouth daily with breakfast. Take along with calcium and magnesium.    Dispense:  30 capsule    Refill:  5    Fill one day early if pharmacy is closed on scheduled refill date. May substitute for generic if available. May substitute with similar over-the-counter product.  . Magnesium 500 MG CAPS    Sig: Take 1 capsule (500 mg total) by mouth 2 (  two) times daily at 8 am and 10 pm.    Dispense:  60 capsule    Refill:  5    Fill one day early if pharmacy is closed on scheduled refill date. May substitute for generic  if available. May substitute with similar over-the-counter product.  . calcium carbonate (CALCIUM 600) 600 MG TABS tablet    Sig: Take 2 tablets (1,200 mg total) by mouth daily with breakfast.    Dispense:  60 tablet    Refill:  5    Fill one day early if pharmacy is closed on scheduled refill date. May substitute for generic if available.    Procedure Orders     Caudal Epidural Injection  Lab Orders     Compliance Drug Analysis, Ur     Comp. Metabolic Panel (12)     Magnesium     Vitamin B12     Sedimentation rate     25-Hydroxy vitamin D Lcms D2+D3     C-reactive protein  Imaging Orders     DG Lumbar Spine Complete W/Bend Referral Orders  No referral(s) requested today    Orders:  Orders Placed This Encounter  Procedures  . Caudal Epidural Injection    Standing Status:   Future    Standing Expiration Date:   06/25/2019    Scheduling Instructions:     Laterality: Left-sided     Level(s): Sacrococcygeal canal (Tailbone area)     Sedation: Patient's choice     Scheduling Timeframe: As soon as pre-approved    Order Specific Question:   Where will this procedure be performed?    Answer:   [facility]  . DG Lumbar Spine Complete W/Bend    Patient presents with axial pain with possible radicular component.  In addition to any acute findings, please report on:  1. Facet (Zygapophyseal) joint DJD (Hypertrophy, space narrowing, subchondral sclerosis, and/or osteophyte formation) 2. DDD and/or IVDD (Loss of disc height, desiccation or "Black disc disease") 3. Pars defects 4. Spondylolisthesis, spondylosis, and/or spondyloarthropathies (include Degree/Grade of displacement in mm) 5. Vertebral body Fractures, including age (old, new/acute) 28. Modic Type Changes 7. Demineralization 8. Bone pathology 9. Central, Lateral Recess, and/or Foraminal Stenosis (include AP diameter of stenosis in mm) 10. Surgical changes (hardware type, status, and presence of fibrosis)  NOTE:  Please specify level(s) and laterality. If applicable: Please indicate ROM and/or evidence of instability (>59m displacement between flexion and extension views)    Standing Status:   Future    Standing Expiration Date:   08/25/2019    Order Specific Question:   Reason for Exam (SYMPTOM  OR DIAGNOSIS REQUIRED)    Answer:   Low back pain    Order Specific Question:   Is patient pregnant?    Answer:   No    Order Specific Question:   Preferred imaging location?    Answer:   [facility]    Order Specific Question:   Call Results- Best Contact Number?    Answer:   [phone]([facility]    Order Specific Question:   Radiology Contrast Protocol - do NOT remove file path    Answer:   [email or web address]  . Compliance Drug Analysis, Ur    Volume: 30 ml(s). Minimum 3 ml of urine is needed. Document temperature of fresh sample. Indications: Long term (current) use of opiate analgesic ((V37.106 Test#: [id number](Comprehensive Profile)    Order Specific Question:   Release to patient    Answer:   Immediate  .  Comp. Metabolic Panel (12)    With GFR. Indications: Chronic Pain Syndrome (G89.4) & Pharmacotherapy (G33.582)    Order Specific Question:   Has the patient fasted?    Answer:   No    Order Specific Question:   CC Results    Answer:   [id number] [[id number]]    Order Specific Question:   Release to patient    Answer:   Immediate  . Magnesium    Indication: Pharmacologic therapy (Z79.899)    Order Specific Question:   CC Results    Answer:   PCP-NURSE [[id number]]    Order Specific Question:   Release to patient    Answer:   Immediate  . Vitamin B12    Indication: Pharmacologic therapy (J73.668).    Order Specific Question:   CC Results    Answer:   [id number] [[id number]]    Order Specific Question:   Release to patient    Answer:   Immediate  . Sedimentation rate    Indication: Disorder of skeletal system (M89.9)    Order Specific Question:    CC Results    Answer:   PCP-NURSE [[id number]]    Order Specific Question:   Release to patient    Answer:   Immediate  . 25-Hydroxy vitamin D Lcms D2+D3    Indication: Disorder of skeletal system (M89.9).    Order Specific Question:   CC Results    Answer:   XBO-ERQSX [[id number]]    Order Specific Question:   Release to patient    Answer:   Immediate  . C-reactive protein    Indication: Problems influencing health status (Z78.9)    Order Specific Question:   CC Results    Answer:   PCP-NURSE [[id number]]    Order Specific Question:   Release to patient    Answer:   Immediate   Pharmacological management options:  Opioid Analgesics: We'll take over management today. See above orders Membrane stabilizer: Options discussed, including a trial. Muscle relaxant: We have discussed the possibility of a trial NSAID: Trial discussed. Other analgesic(s): To be determined at a later time    Interventional management options: Planned, scheduled, and/or pending:    Diagnostic caudal ESI #1+ diagnostic epidurogram #1    Under consideration:   Diagnostic caudal ESI #1+ diagnostic epidurogram #1  Possible Racz procedure  Diagnostic left L5 TFESI #1  Diagnostic left S1 TFESI #1  Diagnostic left lumbar facet block  Possible left lumbar facet RFA    PRN Procedures:   None at this time    Total duration of non-face-to-face encounter: 20 minutes.  Follow-up plan:   Return for Procedure (w/ sedation): (L) Caudal ESI #1 + Epidurogram #1.    Recent Visits No visits were found meeting these conditions.  Showing recent visits within past 90 days and meeting all other requirements   Future Appointments No visits were found meeting these conditions.  Showing future appointments within next 90 days and meeting all other requirements   Primary Care Physician: McLean-Scocuzza, Nino Glow, MD Location: Telephone Virtual Visit Note by: Gaspar Cola, MD Date: 05/25/2019; Time: 1:53 PM  Note: This  dictation was prepared with Dragon dictation. Any transcriptional errors that may result from this process are unintentional.  Disclaimer:  * Given the special circumstances of the COVID-19 pandemic, the federal government has announced that the Office for Civil Rights (OCR) will exercise its enforcement discretion and will not impose penalties on physicians using telehealth in the event of noncompliance with regulatory  requirements under the [employer] and Accountability Act (HIPAA) in connection with the good faith provision of telehealth during the TSSQS-47 national public health emergency. (AMA)

## 2019-05-25 ENCOUNTER — Telehealth: Payer: Self-pay | Admitting: *Deleted

## 2019-05-25 ENCOUNTER — Ambulatory Visit: Payer: BC Managed Care – PPO | Attending: Pain Medicine | Admitting: Pain Medicine

## 2019-05-25 ENCOUNTER — Other Ambulatory Visit: Payer: Self-pay

## 2019-05-25 DIAGNOSIS — G8929 Other chronic pain: Secondary | ICD-10-CM

## 2019-05-25 DIAGNOSIS — M5442 Lumbago with sciatica, left side: Secondary | ICD-10-CM | POA: Diagnosis not present

## 2019-05-25 DIAGNOSIS — Z79899 Other long term (current) drug therapy: Secondary | ICD-10-CM

## 2019-05-25 DIAGNOSIS — Z789 Other specified health status: Secondary | ICD-10-CM

## 2019-05-25 DIAGNOSIS — M79605 Pain in left leg: Secondary | ICD-10-CM | POA: Diagnosis not present

## 2019-05-25 DIAGNOSIS — M961 Postlaminectomy syndrome, not elsewhere classified: Secondary | ICD-10-CM | POA: Diagnosis not present

## 2019-05-25 DIAGNOSIS — M792 Neuralgia and neuritis, unspecified: Secondary | ICD-10-CM

## 2019-05-25 DIAGNOSIS — M5116 Intervertebral disc disorders with radiculopathy, lumbar region: Secondary | ICD-10-CM | POA: Diagnosis not present

## 2019-05-25 DIAGNOSIS — E559 Vitamin D deficiency, unspecified: Secondary | ICD-10-CM

## 2019-05-25 DIAGNOSIS — M5416 Radiculopathy, lumbar region: Secondary | ICD-10-CM | POA: Insufficient documentation

## 2019-05-25 DIAGNOSIS — G894 Chronic pain syndrome: Secondary | ICD-10-CM

## 2019-05-25 DIAGNOSIS — M899 Disorder of bone, unspecified: Secondary | ICD-10-CM

## 2019-05-25 DIAGNOSIS — M5137 Other intervertebral disc degeneration, lumbosacral region: Secondary | ICD-10-CM

## 2019-05-25 MED ORDER — CALCIUM CARBONATE 600 MG PO TABS: 1200.0000 mg | ORAL_TABLET | Freq: Every day | ORAL | 5 refills | Status: DC

## 2019-05-25 MED ORDER — VITAMIN D3 125 MCG (5000 UT) PO CAPS: 1.0000 | ORAL_CAPSULE | Freq: Every day | ORAL | 5 refills | Status: AC

## 2019-05-25 MED ORDER — GABAPENTIN 300 MG PO CAPS: ORAL_CAPSULE | ORAL | 0 refills | Status: DC

## 2019-05-25 MED ORDER — MAGNESIUM 500 MG PO CAPS: 500.0000 mg | ORAL_CAPSULE | Freq: Two times a day (BID) | ORAL | 5 refills | Status: AC

## 2019-05-25 MED ORDER — ERGOCALCIFEROL 1.25 MG (50000 UT) PO CAPS: 50000.0000 [IU] | ORAL_CAPSULE | ORAL | 0 refills | Status: AC

## 2019-05-25 NOTE — Patient Instructions (Signed)

## 2019-06-10 ENCOUNTER — Encounter: Payer: Self-pay | Admitting: Internal Medicine

## 2019-12-22 ENCOUNTER — Encounter: Payer: Self-pay | Admitting: Internal Medicine

## 2019-12-22 ENCOUNTER — Other Ambulatory Visit: Payer: Self-pay

## 2019-12-22 ENCOUNTER — Ambulatory Visit (INDEPENDENT_AMBULATORY_CARE_PROVIDER_SITE_OTHER): Payer: BC Managed Care – PPO | Admitting: Internal Medicine

## 2019-12-22 VITALS — BP 124/80 | HR 61 | Temp 97.9°F | Ht 65.0 in | Wt 172.0 lb

## 2019-12-22 DIAGNOSIS — E1165 Type 2 diabetes mellitus with hyperglycemia: Secondary | ICD-10-CM

## 2019-12-22 DIAGNOSIS — E559 Vitamin D deficiency, unspecified: Secondary | ICD-10-CM | POA: Diagnosis not present

## 2019-12-22 DIAGNOSIS — E538 Deficiency of other specified B group vitamins: Secondary | ICD-10-CM

## 2019-12-22 DIAGNOSIS — E663 Overweight: Secondary | ICD-10-CM | POA: Insufficient documentation

## 2019-12-22 DIAGNOSIS — M5137 Other intervertebral disc degeneration, lumbosacral region: Secondary | ICD-10-CM

## 2019-12-22 DIAGNOSIS — Z13818 Encounter for screening for other digestive system disorders: Secondary | ICD-10-CM

## 2019-12-22 DIAGNOSIS — M5416 Radiculopathy, lumbar region: Secondary | ICD-10-CM

## 2019-12-22 DIAGNOSIS — R634 Abnormal weight loss: Secondary | ICD-10-CM

## 2019-12-22 DIAGNOSIS — G8929 Other chronic pain: Secondary | ICD-10-CM

## 2019-12-22 DIAGNOSIS — Z1211 Encounter for screening for malignant neoplasm of colon: Secondary | ICD-10-CM

## 2019-12-22 DIAGNOSIS — M5417 Radiculopathy, lumbosacral region: Secondary | ICD-10-CM

## 2019-12-22 DIAGNOSIS — G894 Chronic pain syndrome: Secondary | ICD-10-CM

## 2019-12-22 DIAGNOSIS — M47816 Spondylosis without myelopathy or radiculopathy, lumbar region: Secondary | ICD-10-CM

## 2019-12-22 DIAGNOSIS — R937 Abnormal findings on diagnostic imaging of other parts of musculoskeletal system: Secondary | ICD-10-CM

## 2019-12-22 DIAGNOSIS — M5442 Lumbago with sciatica, left side: Secondary | ICD-10-CM

## 2019-12-22 DIAGNOSIS — R319 Hematuria, unspecified: Secondary | ICD-10-CM | POA: Diagnosis not present

## 2019-12-22 DIAGNOSIS — Z1231 Encounter for screening mammogram for malignant neoplasm of breast: Secondary | ICD-10-CM

## 2019-12-22 DIAGNOSIS — M51379 Other intervertebral disc degeneration, lumbosacral region without mention of lumbar back pain or lower extremity pain: Secondary | ICD-10-CM

## 2019-12-22 LAB — CBC WITH DIFFERENTIAL/PLATELET
Basophils Absolute: 0 10*3/uL (ref 0.0–0.1)
Basophils Relative: 0.8 % (ref 0.0–3.0)
Eosinophils Absolute: 0.1 10*3/uL (ref 0.0–0.7)
Eosinophils Relative: 1.3 % (ref 0.0–5.0)
HCT: 45.5 % (ref 36.0–46.0)
Hemoglobin: 15.4 g/dL — ABNORMAL HIGH (ref 12.0–15.0)
Lymphocytes Relative: 42.9 % (ref 12.0–46.0)
Lymphs Abs: 2 10*3/uL (ref 0.7–4.0)
MCHC: 33.8 g/dL (ref 30.0–36.0)
MCV: 90.8 fl (ref 78.0–100.0)
Monocytes Absolute: 0.6 10*3/uL (ref 0.1–1.0)
Monocytes Relative: 12.5 % — ABNORMAL HIGH (ref 3.0–12.0)
Neutro Abs: 2 10*3/uL (ref 1.4–7.7)
Neutrophils Relative %: 42.5 % — ABNORMAL LOW (ref 43.0–77.0)
Platelets: 275 10*3/uL (ref 150.0–400.0)
RBC: 5.02 Mil/uL (ref 3.87–5.11)
RDW: 14.1 % (ref 11.5–15.5)
WBC: 4.6 10*3/uL (ref 4.0–10.5)

## 2019-12-22 LAB — COMPREHENSIVE METABOLIC PANEL
ALT: 16 U/L (ref 0–35)
AST: 14 U/L (ref 0–37)
Albumin: 4.1 g/dL (ref 3.5–5.2)
Alkaline Phosphatase: 78 U/L (ref 39–117)
BUN: 15 mg/dL (ref 6–23)
CO2: 27 mEq/L (ref 19–32)
Calcium: 9.4 mg/dL (ref 8.4–10.5)
Chloride: 104 mEq/L (ref 96–112)
Creatinine, Ser: 0.71 mg/dL (ref 0.40–1.20)
GFR: 105.08 mL/min (ref >60.00)
Glucose, Bld: 78 mg/dL (ref 70–99)
Potassium: 4.4 mEq/L (ref 3.5–5.1)
Sodium: 138 mEq/L (ref 135–145)
Total Bilirubin: 0.7 mg/dL (ref 0.2–1.2)
Total Protein: 7 g/dL (ref 6.0–8.3)

## 2019-12-22 LAB — VITAMIN D 25 HYDROXY (VIT D DEFICIENCY, FRACTURES): VITD: 19.22 ng/mL — ABNORMAL LOW (ref 30.00–100.00)

## 2019-12-22 LAB — HEMOGLOBIN A1C: Hgb A1c MFr Bld: 6.1 % (ref 4.6–6.5)

## 2019-12-22 LAB — T4, FREE: Free T4: 0.72 ng/dL (ref 0.60–1.60)

## 2019-12-22 LAB — LIPID PANEL
Cholesterol: 211 mg/dL — ABNORMAL HIGH (ref 0–200)
HDL: 41.7 mg/dL (ref >39.00)
LDL Cholesterol: 139 mg/dL — ABNORMAL HIGH (ref 0–99)
NonHDL: 169.31
Total CHOL/HDL Ratio: 5
Triglycerides: 154 mg/dL — ABNORMAL HIGH (ref 0.0–149.0)
VLDL: 30.8 mg/dL (ref 0.0–40.0)

## 2019-12-22 LAB — TSH: TSH: 0.96 u[IU]/mL (ref 0.35–4.50)

## 2019-12-22 LAB — VITAMIN B12: Vitamin B-12: 258 pg/mL (ref 211–911)

## 2019-12-22 MED ORDER — OXYCODONE-ACETAMINOPHEN 10-325 MG PO TABS: 1.0000 | ORAL_TABLET | Freq: Two times a day (BID) | ORAL | 0 refills | Status: AC | PRN

## 2019-12-22 MED ORDER — CHLORZOXAZONE 750 MG PO TABS: 1.0000 | ORAL_TABLET | Freq: Every evening | ORAL | 0 refills | Status: DC | PRN

## 2019-12-22 NOTE — Progress Notes (Signed)
Chief Complaint  Patient presents with   Weight Loss   Diabetes   F/u  1. C/o weight loss 20 lbs since 11/2019 she has had stressors 51 y.o daughter with bipolar and stressing her out  Will do HM exams she reports she is eating the same overall and smoking 1/2 ppd cig and not trying to lose weight but admits anxiety ahs been up  H/o DM 2 A1C 6.8 03/2018  Review of Systems  Constitutional: Positive for weight loss.  Eyes: Negative for blurred vision.  Respiratory: Negative for sputum production.   Cardiovascular: Negative for chest pain.  Gastrointestinal: Negative for abdominal pain.  Musculoskeletal: Negative for falls.  Skin: Negative for rash.  Neurological: Negative for headaches.  Psychiatric/Behavioral: The patient is nervous/anxious.    Past Medical History:  Diagnosis Date   Chicken pox    GERD (gastroesophageal reflux disease)    History of kidney stones    passed 2 , litrotrispy   Hyperlipidemia    Liver lesion    hemangioma vs FNH vs adenoma noted MRI 2008 (Dr. Allen Norris)    Pneumonia    Pre-diabetes    Prediabetes    Trigger finger of left thumb    Past Surgical History:  Procedure Laterality Date   ABDOMINAL HYSTERECTOMY     2006/2007 for endometriosis    BACK SURGERY     L4/5 discectomy 09/18/2015 Dr. Lynann Bologna    breast reduction     [place] LITHOTRIPSY Right 06/07/2015   Procedure: EXTRACORPOREAL SHOCK WAVE LITHOTRIPSY (ESWL);  Surgeon: Royston Cowper, MD;  Location: [facility];  Service: Urology;  Laterality: Right;   RADIOLOGY WITH ANESTHESIA N/A 07/19/2015   Procedure: MRI LUMBER SPIN WITHOUT;  Surgeon: Medication Radiologist, MD;  Location: [place];  Service: Radiology;  Laterality: N/A;   RADIOLOGY WITH ANESTHESIA N/A 09/22/2017   Procedure: MRI LUMBAR SPINE WITHOUT CONTRAST;  Surgeon: Radiologist, Medication, MD;  Location: [place];  Service: Radiology;  Laterality: N/A;   SHOULDER OPEN ROTATOR CUFF REPAIR Bilateral 2007  2008   right 2007, left 2008    TRIGGER FINGER RELEASE Left 02/18/2016   Procedure: LEFT TRIGGER THUMB RELEASE;  Surgeon: Milly Jakob, MD;  Location: [place];  Service: Orthopedics;  Laterality: Left;   WISDOM TOOTH EXTRACTION     Family History  Problem Relation Age of Onset   Arthritis Mother    Early death Mother    Heart disease Mother        MVP with repair died from anestheisa    Hyperlipidemia Mother    Hypertension Mother    Kidney disease Mother    Early death Father    Diabetes Father    Hyperlipidemia Father    Heart disease Father    Hypertension Father    Kidney disease Father    Asthma Daughter    Depression Daughter    Bipolar disorder Daughter    Diabetes Maternal Grandmother    Heart disease Maternal Grandmother    Heart disease Maternal Grandfather    Early death Paternal Grandmother    Diabetes Paternal Grandmother    Cancer Paternal Grandfather        prostate    Social History   Socioeconomic History   Marital status: Married    Spouse name: Not on file   Number of children: Not on file   Years of education: Not on file   Highest education level: Not on file  Occupational History  Not on file  Tobacco Use   Smoking status: Current Every Day Smoker    Packs/day: 0.50    Years: 20.00    Pack years: 10.00    Types: Cigarettes   Smokeless tobacco: Never Used  Substance and Sexual Activity   Alcohol use: No   Drug use: No   Sexual activity: Yes  Other Topics Concern   Not on file  Social History Narrative   Married    2 kids son going to army   Daughter going to [employer] fall 2019    Owns guns    Wears seat belt    Safe in relationship    Social Determinants of Health   Financial Resource Strain:    Difficulty of Paying Living Expenses: Not on file  Food Insecurity:    Worried About Charity fundraiser in the Last Year: Not on file   [employer] of Food in the Last Year: Not on file   Transportation Needs:    Lack of Transportation (Medical): Not on file   Lack of Transportation (Non-Medical): Not on file  Physical Activity:    Days of Exercise per Week: Not on file   Minutes of Exercise per Session: Not on file  Stress:    Feeling of Stress : Not on file  Social Connections:    Frequency of Communication with Friends and Family: Not on file   Frequency of Social Gatherings with Friends and Family: Not on file   Attends Religious Services: Not on file   Active Member of Clubs or Organizations: Not on file   Attends Archivist Meetings: Not on file   Marital Status: Not on file  Intimate Partner Violence:    Fear of Current or Ex-Partner: Not on file   Emotionally Abused: Not on file   Physically Abused: Not on file   Sexually Abused: Not on file   No outpatient medications have been marked as taking for the 12/22/19 encounter (Office Visit) with McLean-Scocuzza, Nino Glow, MD.   No Known Allergies No results found for this or any previous visit (from the past 2160 hour(s)). Objective  Body mass index is 28.62 kg/m. Wt Readings from Last 3 Encounters:  12/22/19 172 lb (78 kg)  04/20/18 199 lb 3.2 oz (90.4 kg)  12/14/17 194 lb (88 kg)   Temp Readings from Last 3 Encounters:  12/22/19 97.9 F (36.6 C) (Oral)  11/16/18 98.2 F (36.8 C) (Oral)  04/20/18 98.1 F (36.7 C) (Oral)   BP Readings from Last 3 Encounters:  12/22/19 124/80  11/16/18 117/86  04/20/18 108/70   Pulse Readings from Last 3 Encounters:  12/22/19 61  11/16/18 75  04/20/18 81    Physical Exam Vitals and nursing note reviewed.  Constitutional:      Appearance: Normal appearance. She is well-developed, well-groomed and overweight.  HENT:     Head: Normocephalic and atraumatic.  Eyes:     Conjunctiva/sclera: Conjunctivae normal.     Pupils: Pupils are equal, round, and reactive to light.  Cardiovascular:     Rate and Rhythm: Normal rate and regular  rhythm.     Heart sounds: Normal heart sounds. No murmur heard.   Pulmonary:     Effort: Pulmonary effort is normal.     Breath sounds: Normal breath sounds.  Abdominal:     General: Abdomen is flat. Bowel sounds are normal.     Tenderness: There is no abdominal tenderness.  Skin:    General: Skin  is warm and dry.  Neurological:     General: No focal deficit present.     Mental Status: She is alert and oriented to person, place, and time. Mental status is at baseline.     Gait: Gait normal.  Psychiatric:        Attention and Perception: Attention and perception normal.        Mood and Affect: Mood and affect normal.        Speech: Speech normal.        Behavior: Behavior normal. Behavior is cooperative.        Thought Content: Thought content normal.        Cognition and Memory: Cognition and memory normal.        Judgment: Judgment normal.     Assessment  Plan  Weight loss - Plan: Comprehensive metabolic panel, CBC with Differential/Platelet, TSH, Urinalysis, Routine w reflex microscopic, T4, free, Ambulatory referral to Gastroenterology wants colonoscopy declines EGD for now Dr. Alice Reichert   Type 2 diabetes mellitus with hyperglycemia, without long-term current use of insulin ([place]) - Plan: Comprehensive metabolic panel, Lipid panel, CBC with Differential/Platelet, Hemoglobin A1c, Microalbumin / creatinine urine ratio  Vitamin D deficiency - Plan: Vitamin D (25 hydroxy)  Hematuria, unspecified type - Plan: Urine Culture  B12 deficiency - Plan: B12  Lumbosacral radiculopathy (S1) (Left) - Plan: Chlorzoxazone (LORZONE) 750 MG TABS, oxyCODONE-acetaminophen (PERCOCET) 10-325 MG tablet Chronic pain syndrome - Plan: Chlorzoxazone (LORZONE) 750 MG TABS, oxyCODONE-acetaminophen (PERCOCET) 10-325 MG tablet Chronic low back pain (Secondary Area of Pain) (Left) w/ sciatica (Left) - Plan: Chlorzoxazone (LORZONE) 750 MG TABS, oxyCODONE-acetaminophen (PERCOCET) 10-325 MG tablet DDD  (degenerative disc disease), lumbosacral - Plan: Chlorzoxazone (LORZONE) 750 MG TABS, oxyCODONE-acetaminophen (PERCOCET) 10-325 MG tablet Lumbar facet arthropathy - Plan: Chlorzoxazone (LORZONE) 750 MG TABS, oxyCODONE-acetaminophen (PERCOCET) 10-325 MG tablet Abnormal MRI, lumbar spine - Plan: Chlorzoxazone (LORZONE) 750 MG TABS, oxyCODONE-acetaminophen (PERCOCET) 10-325 MG tablet Lumbar radiculopathy - Plan: Chlorzoxazone (LORZONE) 750 MG TABS, oxyCODONE-acetaminophen (PERCOCET) 10-325 MG tablet -pt consider referral [facility]   Overweight (BMI 25.0-29.9)  rec healthy diet and exercise   HM Never flu shot  Tdap ? Last with Dr. Clemmie Krill check [facility]  MMR immune and hep B immune  Ordered hep C lab   mammo ordered [facility] [place]   Get copy prior [facility] s/p hysterectomy in 2006/2007 for endometriosisshe reports h/o abnormal pap had total hysterectomy   -consider pap in future at f/u h/o abnormal pap  rec smoknig cessation smoker since age 37 y.o max 1 ppd now <1ppd   Colonoscopy referred Dr. Alice Reichert GI   [facility]  Dentist Dr. Bronson Curb  Provider: Dr. Olivia Mackie McLean-Scocuzza-Internal Medicine

## 2019-12-22 NOTE — Patient Instructions (Addendum)
Debrox ear wax drops Look up [facility] or [facility] for your back and let me know   Call and schedule mammogram  Colonoscopy-Dr. Alice Reichert    Diabetes Mellitus and Nutrition, Adult When you have diabetes (diabetes mellitus), it is very important to have healthy eating habits because your blood sugar (glucose) levels are greatly affected by what you eat and drink. Eating healthy foods in the appropriate amounts, at about the same times every day, can help you:  Control your blood glucose.  Lower your risk of heart disease.  Improve your blood pressure.  Reach or maintain a healthy weight. Every person with diabetes is different, and each person has different needs for a meal plan. Your health care provider may recommend that you work with a diet and nutrition specialist (dietitian) to make a meal plan that is best for you. Your meal plan may vary depending on factors such as:  The calories you need.  The medicines you take.  Your weight.  Your blood glucose, blood pressure, and cholesterol levels.  Your activity level.  Other health conditions you have, such as heart or kidney disease. How do carbohydrates affect me? Carbohydrates, also called carbs, affect your blood glucose level more than any other type of food. Eating carbs naturally raises the amount of glucose in your blood. Carb counting is a method for keeping track of how many carbs you eat. Counting carbs is important to keep your blood glucose at a healthy level, especially if you use insulin or take certain oral diabetes medicines. It is important to know how many carbs you can safely have in each meal. This is different for every person. Your dietitian can help you calculate how many carbs you should have at each meal and for each snack. Foods that contain carbs include:  Bread, cereal, rice, pasta, and crackers.  Potatoes and corn.  Peas, beans, and lentils.  Milk and yogurt.  Fruit and  juice.  Desserts, such as cakes, cookies, ice cream, and candy. How does alcohol affect me? Alcohol can cause a sudden decrease in blood glucose (hypoglycemia), especially if you use insulin or take certain oral diabetes medicines. Hypoglycemia can be a life-threatening condition. Symptoms of hypoglycemia (sleepiness, dizziness, and confusion) are similar to symptoms of having too much alcohol. If your health care provider says that alcohol is safe for you, follow these guidelines:  Limit alcohol intake to no more than 1 drink per day for nonpregnant women and 2 drinks per day for men. One drink equals 12 oz of beer, 5 oz of wine, or 1 oz of hard liquor.  Do not drink on an empty stomach.  Keep yourself hydrated with water, diet soda, or unsweetened iced tea.  Keep in mind that regular soda, juice, and other mixers may contain a lot of sugar and must be counted as carbs. What are tips for following this plan?  Reading food labels  Start by checking the serving size on the "Nutrition Facts" label of packaged foods and drinks. The amount of calories, carbs, fats, and other nutrients listed on the label is based on one serving of the item. Many items contain more than one serving per package.  Check the total grams (g) of carbs in one serving. You can calculate the number of servings of carbs in one serving by dividing the total carbs by 15. For example, if a food has 30 g of total carbs, it would be equal to 2 servings of carbs.  Check the number of grams (g) of saturated and trans fats in one serving. Choose foods that have low or no amount of these fats.  Check the number of milligrams (mg) of salt (sodium) in one serving. Most people should limit total sodium intake to less than 2,300 mg per day.  Always check the nutrition information of foods labeled as "low-fat" or "nonfat". These foods may be higher in added sugar or refined carbs and should be avoided.  Talk to your dietitian to  identify your daily goals for nutrients listed on the label. Shopping  Avoid buying canned, premade, or processed foods. These foods tend to be high in fat, sodium, and added sugar.  Shop around the outside edge of the grocery store. This includes fresh fruits and vegetables, bulk grains, fresh meats, and fresh dairy. Cooking  Use low-heat cooking methods, such as baking, instead of high-heat cooking methods like deep frying.  Cook using healthy oils, such as olive, canola, or sunflower oil.  Avoid cooking with butter, cream, or high-fat meats. Meal planning  Eat meals and snacks regularly, preferably at the same times every day. Avoid going long periods of time without eating.  Eat foods high in fiber, such as fresh fruits, vegetables, beans, and whole grains. Talk to your dietitian about how many servings of carbs you can eat at each meal.  Eat 4-6 ounces (oz) of lean protein each day, such as lean meat, chicken, fish, eggs, or tofu. One oz of lean protein is equal to: ? 1 oz of meat, chicken, or fish. ? 1 egg. ?  cup of tofu.  Eat some foods each day that contain healthy fats, such as avocado, nuts, seeds, and fish. Lifestyle  Check your blood glucose regularly.  Exercise regularly as told by your health care provider. This may include: ? 150 minutes of moderate-intensity or vigorous-intensity exercise each week. This could be brisk walking, biking, or water aerobics. ? Stretching and doing strength exercises, such as yoga or weightlifting, at least 2 times a week.  Take medicines as told by your health care provider.  Do not use any products that contain nicotine or tobacco, such as cigarettes and e-cigarettes. If you need help quitting, ask your health care provider.  Work with a Social worker or diabetes educator to identify strategies to manage stress and any emotional and social challenges. Questions to ask a health care provider  Do I need to meet with a diabetes  educator?  Do I need to meet with a dietitian?  What number can I call if I have questions?  When are the best times to check my blood glucose? Where to find more information:  American Diabetes Association: diabetes.org  Academy of Nutrition and Dietetics: www.eatright.[employer] of Diabetes and Digestive and Kidney Diseases (NIH): [employer] Summary  A healthy meal plan will help you control your blood glucose and maintain a healthy lifestyle.  Working with a diet and nutrition specialist (dietitian) can help you make a meal plan that is best for you.  Keep in mind that carbohydrates (carbs) and alcohol have immediate effects on your blood glucose levels. It is important to count carbs and to use alcohol carefully. This information is not intended to replace advice given to you by your health care provider. Make sure you discuss any questions you have with your health care provider. Document Revised: 02/20/2017 Document Reviewed: 04/14/2016 Elsevier Patient Education  2020 [employer].

## 2019-12-23 ENCOUNTER — Other Ambulatory Visit: Payer: BC Managed Care – PPO

## 2019-12-23 DIAGNOSIS — Z113 Encounter for screening for infections with a predominantly sexual mode of transmission: Secondary | ICD-10-CM | POA: Diagnosis not present

## 2019-12-23 DIAGNOSIS — Z13818 Encounter for screening for other digestive system disorders: Secondary | ICD-10-CM

## 2019-12-23 LAB — URINALYSIS, ROUTINE W REFLEX MICROSCOPIC
Bilirubin Urine: NEGATIVE
Glucose, UA: NEGATIVE
Hgb urine dipstick: NEGATIVE
Ketones, ur: NEGATIVE
Leukocytes,Ua: NEGATIVE
Nitrite: NEGATIVE
Protein, ur: NEGATIVE
Specific Gravity, Urine: 1.025 (ref 1.001–1.03)
pH: 5.5 (ref 5.0–8.0)

## 2019-12-23 LAB — MICROALBUMIN / CREATININE URINE RATIO
Creatinine, Urine: 200 mg/dL (ref 20–275)
Microalb Creat Ratio: 4 mcg/mg creat (ref <30)
Microalb, Ur: 0.8 mg/dL

## 2019-12-23 LAB — URINE CULTURE
MICRO NUMBER:: 11015610
SPECIMEN QUALITY:: ADEQUATE

## 2019-12-23 NOTE — Progress Notes (Signed)
Add on sheet has been faxed.

## 2019-12-26 ENCOUNTER — Other Ambulatory Visit: Payer: Self-pay | Admitting: Internal Medicine

## 2019-12-26 ENCOUNTER — Encounter: Payer: Self-pay | Admitting: Internal Medicine

## 2019-12-26 DIAGNOSIS — I700 Atherosclerosis of aorta: Secondary | ICD-10-CM | POA: Insufficient documentation

## 2019-12-26 DIAGNOSIS — E559 Vitamin D deficiency, unspecified: Secondary | ICD-10-CM

## 2019-12-26 LAB — HEPATITIS C ANTIBODY
Hepatitis C Ab: NONREACTIVE
SIGNAL TO CUT-OFF: 0.01 (ref <1.00)

## 2019-12-26 MED ORDER — CHOLECALCIFEROL 1.25 MG (50000 UT) PO CAPS: 50000.0000 [IU] | ORAL_CAPSULE | ORAL | 1 refills | Status: DC

## 2019-12-27 ENCOUNTER — Encounter: Payer: Self-pay | Admitting: *Deleted

## 2020-01-09 ENCOUNTER — Telehealth: Payer: Self-pay | Admitting: Internal Medicine

## 2020-01-09 NOTE — Telephone Encounter (Signed)
Faxed to [facility] for [facility] faxed  On 01-09-20

## 2020-02-02 ENCOUNTER — Telehealth: Payer: Self-pay | Admitting: Internal Medicine

## 2020-02-02 ENCOUNTER — Ambulatory Visit: Payer: BC Managed Care – PPO | Admitting: Internal Medicine

## 2020-02-02 NOTE — Telephone Encounter (Signed)
Patient no-showed today's appointment; appointment was for 11/11, provider notified for review of record. Mychart message sent for patient to re-schedule.

## 2020-02-23 ENCOUNTER — Encounter: Payer: Self-pay | Admitting: Internal Medicine

## 2020-03-26 DIAGNOSIS — U071 COVID-19: Secondary | ICD-10-CM | POA: Diagnosis not present

## 2020-03-26 DIAGNOSIS — Z20822 Contact with and (suspected) exposure to covid-19: Secondary | ICD-10-CM | POA: Diagnosis not present

## 2020-03-26 DIAGNOSIS — Z03818 Encounter for observation for suspected exposure to other biological agents ruled out: Secondary | ICD-10-CM | POA: Diagnosis not present

## 2020-03-28 ENCOUNTER — Telehealth: Payer: Self-pay

## 2020-03-28 NOTE — Telephone Encounter (Signed)
Spoke with Patient and she did not want to be seen by urgent care.  Gave patient the recommendations listed for isolation and OTC meds. Patient verbalized understanding Scheduled to be se virtually 1/7 at 1:00 pm. Informed to go to ED or Urgent care should symptoms worsen

## 2020-03-28 NOTE — Telephone Encounter (Signed)
Pt took a home covid test on Sunday 03/25/20 and tested positive. She then went to [facility] on Monday 03/26/20 and she tested positive there also. Pt has a headache she can't get rid of, loss of taste and smell, real congested, and sinus pressure. Pt would like to know what she should do? She said she can do a virtual appointment if she needs too. Also we didn't have any available appointments until Friday.

## 2020-03-28 NOTE — Telephone Encounter (Signed)
Please advise, should Patient see urgent care?

## 2020-03-28 NOTE — Telephone Encounter (Signed)
Yes [facility] urgent care  Can try to call infusion line for patient as well  If needed virtual with me when available or can do above  Needs to isolate, disinfect and wear mask in house  Not sleep same bed with husband  He needs to be tested too schedule Barbra Sarks also my patient  Stay at home x 14 days  There is no medication other than over the counter meds: Mucinex dm green label for cough. Vitamin C 1000 mg daily. Vitamin D3 4000 Iu (units) daily. Zinc 100 mg daily. Quercetin 250-500 mg 2 times per day  Monitor pulse ox, buy from [facility] if oxygen is less than 90 please go to the hospital.     Are you feeling really sick? Shortness of breath, cough, chest pain? Dizziness  If so let me know  If worsening, go to hospital.

## 2020-03-29 ENCOUNTER — Telehealth: Payer: Self-pay | Admitting: Pain Medicine

## 2020-03-29 DIAGNOSIS — Z1152 Encounter for screening for COVID-19: Secondary | ICD-10-CM | POA: Diagnosis not present

## 2020-03-29 DIAGNOSIS — Z03818 Encounter for observation for suspected exposure to other biological agents ruled out: Secondary | ICD-10-CM | POA: Diagnosis not present

## 2020-03-30 ENCOUNTER — Encounter: Payer: Self-pay | Admitting: Internal Medicine

## 2020-03-30 ENCOUNTER — Telehealth (INDEPENDENT_AMBULATORY_CARE_PROVIDER_SITE_OTHER): Payer: BC Managed Care – PPO | Admitting: Internal Medicine

## 2020-03-30 DIAGNOSIS — U071 COVID-19: Secondary | ICD-10-CM | POA: Diagnosis not present

## 2020-03-30 DIAGNOSIS — J40 Bronchitis, not specified as acute or chronic: Secondary | ICD-10-CM

## 2020-03-30 DIAGNOSIS — J329 Chronic sinusitis, unspecified: Secondary | ICD-10-CM

## 2020-03-30 MED ORDER — ALBUTEROL SULFATE HFA 108 (90 BASE) MCG/ACT IN AERS: 1.0000 | INHALATION_SPRAY | Freq: Four times a day (QID) | RESPIRATORY_TRACT | 2 refills | Status: DC | PRN

## 2020-03-30 MED ORDER — AZITHROMYCIN 250 MG PO TABS: ORAL_TABLET | ORAL | 0 refills | Status: DC

## 2020-03-30 MED ORDER — PREDNISONE 20 MG PO TABS: 40.0000 mg | ORAL_TABLET | Freq: Every day | ORAL | 0 refills | Status: DC

## 2020-03-30 NOTE — Patient Instructions (Addendum)
There is no medication other than over the counter meds: Mucinex dm green label for cough. Vitamin C 1000 mg daily. Vitamin D3 4000 Iu (units) daily. Zinc 100 mg daily. Quercetin 250-500 mg 2 times per day  Monitor oxygen with pulse oximeter, buy from [facility] if oxygen is less than 90 please go to the hospital. Oil of oregano  Elderberry syrup    Are you feeling really sick? Shortness of breath, cough, chest pain? If so let me know  If worsening, go to hospital.

## 2020-03-30 NOTE — Progress Notes (Signed)
Telephone Note  I connected with Lori Lang  on 03/30/20 at  1:10 PM EST by a telephone and verified that I am speaking with the correct person using two identifiers. Location patient: home, [place] Location provider:work or home office Persons participating in the virtual visit: patient, provider  I discussed the limitations of evaluation and management by telemedicine and the availability of in person appointments. The patient expressed understanding and agreed to proceed.   HPI: covid 19 + 03/25/20 rapid test and 03/29/20 rapid test negative [facility] had sinus pressure/congestion, fever + 03/24/20 and chills. Lost sense of taste and smell and having night sweats. She tried dayquil cold and sinus/flu, super C otc, She also has sob with fatigue. She has been out of work 7 days this Sunday and feeling 50-60% better and will plan to return to work Thursdays    OS: See pertinent positives and negatives per HPI.  Past Medical History:  Diagnosis Date   Chicken pox    GERD (gastroesophageal reflux disease)    History of kidney stones    passed 2 , litrotrispy   Hyperlipidemia    Liver lesion    hemangioma vs FNH vs adenoma noted MRI 2008 (Dr. Wohl)    Pneumonia    Pre-diabetes    Prediabetes    Trigger finger of left thumb     Past Surgical History:  Procedure Laterality Date   ABDOMINAL HYSTERECTOMY     2006/2007 for endometriosis    BACK SURGERY     L4/5 discectomy 09/18/2015 Dr. Dumonski    breast reduction     19 18   [place] LITHOTRIPSY Right 06/07/2015   Procedure: EXTRACORPOREAL SHOCK WAVE LITHOTRIPSY (ESWL);  Surgeon: Royston Cowper, MD;  Location: [facility];  Service: Urology;  Laterality: Right;   RADIOLOGY WITH ANESTHESIA N/A 07/19/2015   Procedure: MRI LUMBER SPIN WITHOUT;  Surgeon: Medication Radiologist, MD;  Location: [place];  Service: Radiology;  Laterality: N/A;   RADIOLOGY WITH ANESTHESIA N/A 09/22/2017   Procedure: MRI LUMBAR SPINE  WITHOUT CONTRAST;  Surgeon: Radiologist, Medication, MD;  Location: [place];  Service: Radiology;  Laterality: N/A;   SHOULDER OPEN ROTATOR CUFF REPAIR Bilateral 2007 2008   right 2007, left 2008    TRIGGER FINGER RELEASE Left 02/18/2016   Procedure: LEFT TRIGGER THUMB RELEASE;  Surgeon: Milly Jakob, MD;  Location: [place];  Service: Orthopedics;  Laterality: Left;   WISDOM TOOTH EXTRACTION       Current Outpatient Medications:    albuterol (VENTOLIN HFA) 108 (90 Base) MCG/ACT inhaler, Inhale 1-2 puffs into the lungs every 6 (six) hours as needed for wheezing or shortness of breath., Disp: 18 g, Rfl: 2   azithromycin (ZITHROMAX) 250 MG tablet, 2 pills days 1 and 1 pill day 2-5 with food, Disp: 6 tablet, Rfl: 0   predniSONE (DELTASONE) 20 MG tablet, Take 2 tablets (40 mg total) by mouth daily with breakfast. X 5-10 days, Disp: 20 tablet, Rfl: 0   Chlorzoxazone (LORZONE) 750 MG TABS, Take 1 tablet by mouth at bedtime as needed., Disp: 90 tablet, Rfl: 0   Cholecalciferol 1.25 MG (50000 UT) capsule, Take 1 capsule (50,000 Units total) by mouth once a week. D/c after 6 months and take vitamin D3 4000 IU daily otc, Disp: 13 capsule, Rfl: 1  EXAM:  VITALS per patient if applicable:  GENERAL: alert, oriented, appears well and in no acute distress  HEENT: atraumatic, conjunttiva clear, no obvious abnormalities on inspection of  external nose and ears  NECK: normal movements of the head and neck  LUNGS: on inspection no signs of respiratory distress, breathing rate appears normal, no obvious gross SOB, gasping or wheezing  CV: no obvious cyanosis  MS: moves all visible extremities without noticeable abnormality  PSYCH/NEURO: pleasant and cooperative, no obvious depression or anxiety, speech and thought processing grossly intact  ASSESSMENT AND PLAN:  Discussed the following assessment and plan:  COVID-19 - Plan: azithromycin (ZITHROMAX) 250 MG tablet, predniSONE  (DELTASONE) 20 MG tablet, albuterol (VENTOLIN HFA) 108 (90 Base) MCG/ACT inhaler  Sinusitis, unspecified chronicity, unspecified location - Plan: azithromycin (ZITHROMAX) 250 MG tablet, predniSONE (DELTASONE) 20 MG tablet, albuterol (VENTOLIN HFA) 108 (90 Base) MCG/ACT inhaler  Bronchitis - Plan: albuterol (VENTOLIN HFA) 108 (90 Base) MCG/ACT inhaler  There is no medication other than over the counter meds: Mucinex dm green label for cough. Vitamin C 1000 mg daily. Vitamin D3 4000 Iu (units) daily. Zinc 100 mg daily. Quercetin 250-500 mg 2 times per day  Monitor oxygen with pulse oximeter, buy from [facility] if oxygen is less than 90 please go to the hospital. Oil of oregano  Elderberry syrup    Are you feeling really sick? Shortness of breath, cough, chest pain? Confused  If so let me know  If worsening, go to hospital [facility] or [facility] urgent care    Return to work 04/05/20  Let me know if note needed   -we discussed possible serious and likely etiologies, options for evaluation and workup, limitations of telemedicine visit vs in person visit, treatment, treatment risks and precautions.     I discussed the assessment and treatment plan with the patient. The patient was provided an opportunity to ask questions and all were answered. The patient agreed with the plan and demonstrated an understanding of the instructions.    Time spent 20 min Delorise Jackson, MD

## 2020-06-29 ENCOUNTER — Ambulatory Visit: Payer: BC Managed Care – PPO | Admitting: Internal Medicine

## 2020-07-19 ENCOUNTER — Other Ambulatory Visit: Payer: Self-pay

## 2020-07-19 ENCOUNTER — Encounter: Payer: Self-pay | Admitting: Internal Medicine

## 2020-07-19 ENCOUNTER — Telehealth (INDEPENDENT_AMBULATORY_CARE_PROVIDER_SITE_OTHER): Payer: BC Managed Care – PPO | Admitting: Internal Medicine

## 2020-07-19 VITALS — Ht 65.0 in | Wt 186.0 lb

## 2020-07-19 DIAGNOSIS — J0110 Acute frontal sinusitis, unspecified: Secondary | ICD-10-CM | POA: Diagnosis not present

## 2020-07-19 MED ORDER — AZITHROMYCIN 250 MG PO TABS: ORAL_TABLET | ORAL | 0 refills | Status: AC

## 2020-07-19 MED ORDER — SALINE SPRAY 0.65 % NA SOLN: 2.0000 | Freq: Every day | NASAL | 11 refills | Status: DC | PRN

## 2020-07-19 MED ORDER — FLUTICASONE PROPIONATE 50 MCG/ACT NA SUSP: 2.0000 | Freq: Every day | NASAL | 2 refills | Status: DC | PRN

## 2020-07-19 NOTE — Progress Notes (Signed)
telephone Note  I connected with Lori Lang  on 07/19/20 at  7:30 AM EDT by telephone and verified that I am speaking with the correct person using two identifiers.  Location patient:car Location provider:work or home office Persons participating in the virtual visit: patient, provider  I discussed the limitations of evaluation and management by telemedicine and the availability of in person appointments. The patient expressed understanding and agreed to proceed.   HPI: Sinus pressure eyes, nose, ears tues/weds last week worse Thursday and Saturday lost taste and smell which somewhat came back Tuesday. 3 negative home covid tests no sick contacts and still wearing mask. No h/o sinus issues, recurrent/allergies  -COVID-19 vaccine status: 2/2  ROS: See pertinent positives and negatives per HPI.  Past Medical History:  Diagnosis Date  . Chicken pox   . GERD (gastroesophageal reflux disease)   . History of kidney stones    passed 2 , litrotrispy  . Hyperlipidemia   . Liver lesion    hemangioma vs FNH vs adenoma noted MRI 2008 (Dr. Allen Norris)   . Pneumonia   . Pre-diabetes   . Prediabetes   . Trigger finger of left thumb     Past Surgical History:  Procedure Laterality Date  . ABDOMINAL HYSTERECTOMY     2006/2007 for endometriosis   . BACK SURGERY     L4/5 discectomy 09/18/2015 Dr. Lynann Bologna   . breast reduction     1994  . EXTRACORPOREAL SHOCK WAVE LITHOTRIPSY Right 06/07/2015   Procedure: EXTRACORPOREAL SHOCK WAVE LITHOTRIPSY (ESWL);  Surgeon: Royston Cowper, MD;  Location: [facility];  Service: Urology;  Laterality: Right;  . RADIOLOGY WITH ANESTHESIA N/A 07/19/2015   Procedure: MRI LUMBER SPIN WITHOUT;  Surgeon: Medication Radiologist, MD;  Location: [place];  Service: Radiology;  Laterality: N/A;  . RADIOLOGY WITH ANESTHESIA N/A 09/22/2017   Procedure: MRI LUMBAR SPINE WITHOUT CONTRAST;  Surgeon: Radiologist, Medication, MD;  Location: [place];  Service: Radiology;  Laterality: N/A;   . SHOULDER OPEN ROTATOR CUFF REPAIR Bilateral 2007 2008   right 2007, left 2008   . TRIGGER FINGER RELEASE Left 02/18/2016   Procedure: LEFT TRIGGER THUMB RELEASE;  Surgeon: Milly Jakob, MD;  Location: [place];  Service: Orthopedics;  Laterality: Left;  . WISDOM TOOTH EXTRACTION       Current Outpatient Medications:  .  azithromycin (ZITHROMAX) 250 MG tablet, Take 2 tablets on day 1, then 1 tablet daily on days 2 through 5 with food, Disp: 6 tablet, Rfl: 0 .  Cholecalciferol 1.25 MG (50000 UT) capsule, Take 1 capsule (50,000 Units total) by mouth once a week. D/c after 6 months and take vitamin D3 4000 IU daily otc, Disp: 13 capsule, Rfl: 1 .  fluticasone (FLONASE) 50 MCG/ACT nasal spray, Place 2 sprays into both nostrils daily as needed for allergies or rhinitis., Disp: 16 g, Rfl: 2 .  sodium chloride (OCEAN) 0.65 % SOLN nasal spray, Place 2 sprays into both nostrils daily as needed for congestion., Disp: 30 mL, Rfl: 11 .  albuterol (VENTOLIN HFA) 108 (90 Base) MCG/ACT inhaler, Inhale 1-2 puffs into the lungs every 6 (six) hours as needed for wheezing or shortness of breath. (Patient not taking: Reported on 07/19/2020), Disp: 18 g, Rfl: 2 .  Chlorzoxazone (LORZONE) 750 MG TABS, Take 1 tablet by mouth at bedtime as needed. (Patient not taking: Reported on 07/19/2020), Disp: 90 tablet, Rfl: 0 .  predniSONE (DELTASONE) 20 MG tablet, Take 2 tablets (40 mg total) by mouth  daily with breakfast. X 5-10 days (Patient not taking: Reported on 07/19/2020), Disp: 20 tablet, Rfl: 0  EXAM:  VITALS per patient if applicable:  GENERAL: alert, oriented, appears well and in no acute distress  PSYCH/NEURO: pleasant and cooperative, no obvious depression or anxiety, speech and thought processing grossly intact  ASSESSMENT AND PLAN:  Discussed the following assessment and plan:  Acute frontal sinusitis, recurrence not specified - Plan: azithromycin (ZITHROMAX) 250 MG tablet, sodium  chloride (OCEAN) 0.65 % SOLN nasal spray, fluticasone (FLONASE) 50 MCG/ACT nasal spray  -we discussed possible serious and likely etiologies, options for evaluation and workup, limitations of telemedicine visit vs in person visit, treatment, treatment risks and precautions.     I discussed the assessment and treatment plan with the patient. The patient was provided an opportunity to ask questions and all were answered. The patient agreed with the plan and demonstrated an understanding of the instructions.    Time spent 20 min Delorise Jackson, MD

## 2020-07-19 NOTE — Patient Instructions (Signed)
Sudafed x 3 days of less  2 sprays of nasal saline followed by 2 sprays of flonase  Over the counter allergy pill allegra,claritin, zyrtec,xyzal  Sinusitis, Adult Sinusitis is inflammation of your sinuses. Sinuses are hollow spaces in the bones around your face. Your sinuses are located:  Around your eyes.  In the middle of your forehead.  Behind your nose.  In your cheekbones. Mucus normally drains out of your sinuses. When your nasal tissues become inflamed or swollen, mucus can become trapped or blocked. This allows bacteria, viruses, and fungi to grow, which leads to infection. Most infections of the sinuses are caused by a virus. Sinusitis can develop quickly. It can last for up to 4 weeks (acute) or for more than 12 weeks (chronic). Sinusitis often develops after a cold. What are the causes? This condition is caused by anything that creates swelling in the sinuses or stops mucus from draining. This includes:  Allergies.  Asthma.  Infection from bacteria or viruses.  Deformities or blockages in your nose or sinuses.  Abnormal growths in the nose (nasal polyps).  Pollutants, such as chemicals or irritants in the air.  Infection from fungi (rare). What increases the risk? You are more likely to develop this condition if you:  Have a weak body defense system (immune system).  Do a lot of swimming or diving.  Overuse nasal sprays.  Smoke. What are the signs or symptoms? The main symptoms of this condition are pain and a feeling of pressure around the affected sinuses. Other symptoms include:  Stuffy nose or congestion.  Thick drainage from your nose.  Swelling and warmth over the affected sinuses.  Headache.  Upper toothache.  A cough that may get worse at night.  Extra mucus that collects in the throat or the back of the nose (postnasal drip).  Decreased sense of smell and taste.  Fatigue.  A fever.  Sore throat.  Bad breath. How is this  diagnosed? This condition is diagnosed based on:  Your symptoms.  Your medical history.  A physical exam.  Tests to find out if your condition is acute or chronic. This may include: ? Checking your nose for nasal polyps. ? Viewing your sinuses using a device that has a light (endoscope). ? Testing for allergies or bacteria. ? Imaging tests, such as an MRI or CT scan. In rare cases, a bone biopsy may be done to rule out more serious types of fungal sinus disease. How is this treated? Treatment for sinusitis depends on the cause and whether your condition is chronic or acute.  If caused by a virus, your symptoms should go away on their own within 10 days. You may be given medicines to relieve symptoms. They include: ? Medicines that shrink swollen nasal passages (topical intranasal decongestants). ? Medicines that treat allergies (antihistamines). ? A spray that eases inflammation of the nostrils (topical intranasal corticosteroids). ? Rinses that help get rid of thick mucus in your nose (nasal saline washes).  If caused by bacteria, your health care provider may recommend waiting to see if your symptoms improve. Most bacterial infections will get better without antibiotic medicine. You may be given antibiotics if you have: ? A severe infection. ? A weak immune system.  If caused by narrow nasal passages or nasal polyps, you may need to have surgery. Follow these instructions at home: Medicines  Take, use, or apply over-the-counter and prescription medicines only as told by your health care provider. These may include nasal sprays.  If you were prescribed an antibiotic medicine, take it as told by your health care provider. Do not stop taking the antibiotic even if you start to feel better. Hydrate and humidify  Drink enough fluid to keep your urine pale yellow. Staying hydrated will help to thin your mucus.  Use a cool mist humidifier to keep the humidity level in your home  above 50%.  Inhale steam for 10-15 minutes, 3-4 times a day, or as told by your health care provider. You can do this in the bathroom while a hot shower is running.  Limit your exposure to cool or dry air.   Rest  Rest as much as possible.  Sleep with your head raised (elevated).  Make sure you get enough sleep each night. General instructions  Apply a warm, moist washcloth to your face 3-4 times a day or as told by your health care provider. This will help with discomfort.  Wash your hands often with soap and water to reduce your exposure to germs. If soap and water are not available, use hand sanitizer.  Do not smoke. Avoid being around people who are smoking (secondhand smoke).  Keep all follow-up visits as told by your health care provider. This is important.   Contact a health care provider if:  You have a fever.  Your symptoms get worse.  Your symptoms do not improve within 10 days. Get help right away if:  You have a severe headache.  You have persistent vomiting.  You have severe pain or swelling around your face or eyes.  You have vision problems.  You develop confusion.  Your neck is stiff.  You have trouble breathing. Summary  Sinusitis is soreness and inflammation of your sinuses. Sinuses are hollow spaces in the bones around your face.  This condition is caused by nasal tissues that become inflamed or swollen. The swelling traps or blocks the flow of mucus. This allows bacteria, viruses, and fungi to grow, which leads to infection.  If you were prescribed an antibiotic medicine, take it as told by your health care provider. Do not stop taking the antibiotic even if you start to feel better.  Keep all follow-up visits as told by your health care provider. This is important. This information is not intended to replace advice given to you by your health care provider. Make sure you discuss any questions you have with your health care provider. Document  Revised: 08/10/2017 Document Reviewed: 08/10/2017 Elsevier Patient Education  2021 [employer].

## 2020-07-19 NOTE — Progress Notes (Signed)
Onset of symptoms last Tuesday, no one around the Patient is sick.  3 negative home test for COVID>   Patient having stuffy ears, stuffy head, pressure to the point her gums hurt, on and off cough, loss of taste/smell.

## 2020-07-24 ENCOUNTER — Telehealth: Payer: BC Managed Care – PPO | Admitting: Internal Medicine

## 2020-07-24 ENCOUNTER — Ambulatory Visit: Payer: BC Managed Care – PPO | Admitting: Internal Medicine

## 2020-07-26 DIAGNOSIS — H1031 Unspecified acute conjunctivitis, right eye: Secondary | ICD-10-CM | POA: Diagnosis not present

## 2020-08-06 ENCOUNTER — Other Ambulatory Visit: Payer: Self-pay

## 2020-08-06 ENCOUNTER — Encounter: Payer: Self-pay | Admitting: Podiatry

## 2020-08-06 ENCOUNTER — Ambulatory Visit (INDEPENDENT_AMBULATORY_CARE_PROVIDER_SITE_OTHER): Payer: BC Managed Care – PPO

## 2020-08-06 ENCOUNTER — Ambulatory Visit (INDEPENDENT_AMBULATORY_CARE_PROVIDER_SITE_OTHER): Payer: BC Managed Care – PPO | Admitting: Podiatry

## 2020-08-06 DIAGNOSIS — R52 Pain, unspecified: Secondary | ICD-10-CM | POA: Diagnosis not present

## 2020-08-06 DIAGNOSIS — M722 Plantar fascial fibromatosis: Secondary | ICD-10-CM

## 2020-08-06 DIAGNOSIS — L84 Corns and callosities: Secondary | ICD-10-CM | POA: Diagnosis not present

## 2020-08-06 DIAGNOSIS — M7731 Calcaneal spur, right foot: Secondary | ICD-10-CM

## 2020-08-06 DIAGNOSIS — M2042 Other hammer toe(s) (acquired), left foot: Secondary | ICD-10-CM

## 2020-08-06 MED ORDER — MELOXICAM 15 MG PO TABS: 15.0000 mg | ORAL_TABLET | Freq: Every day | ORAL | 3 refills | Status: DC

## 2020-08-06 NOTE — Progress Notes (Signed)
  Subjective:  Patient ID: Lori Lang, female    DOB: 07-17-1970,  MRN: [id number]  Chief Complaint  Patient presents with  . Foot Pain  . Callouses    Patient presents today for right heel pain x 2-3 weeks and painful callus left 5th met    50 y.o. female presents with the above complaint. History confirmed with patient.  She has a history of fifth hammertoe surgery bilateral by Dr. Tammy Sours at [facility], the lesion on the left side started to come back.  Her surgery was around 2008 or 2009.  She also developed right heel pain on the bottom of right heel.  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  Objective:  Physical Exam: warm, good capillary refill, no trophic changes or ulcerative lesions, normal DP and PT pulses and normal sensory exam. Left Foot: Adductovarus fifth hammertoe with corn and callus over the PIPJ Right Foot: She has sharp pain on palpation of the plantar heel   Radiographs: X-ray of right foot: no fracture, dislocation, swelling or degenerative changes noted, plantar calcaneal spur and posterior calcaneal spur Assessment:   1. Plantar fasciitis of right foot   2. Hammertoe of left foot   3. Callus of foot   4. Pain   5. Heel spur, right      Plan:  Patient was evaluated and treated and all questions answered.  Discussed the etiology and treatment options for plantar fasciitis including stretching, formal physical therapy, supportive shoegears such as a running shoe or sneaker, pre fabricated orthoses, injection therapy, and oral medications. We also discussed the role of surgical treatment of this for patients who do not improve after exhausting non-surgical treatment options.   Plantar Fasciitis -XR reviewed with patient -Educated patient on stretching and icing of the affected limb -Plantar fascial brace dispensed -Rx for meloxicam. Educated on use, risks and benefits of the medication  -Discussed injection therapy as well she has not had good  results with prior injections in her upper extremities.  Would like to hold off for now, I recommend this at next visit if its not improved. -Consider PT and CAM boot at next visit if not improving   Rest discussed revision hammertoe surgery.  She will consider this.  She would be at higher risk of wound complications and infection due to her smoking history.  Today debrided the corn and callus and alleviated pressure.  Follow-up for this as needed  Return in about 1 month (around 09/06/2020) for recheck plantar fasciitis.

## 2020-08-06 NOTE — Patient Instructions (Signed)

## 2020-08-31 DIAGNOSIS — L811 Chloasma: Secondary | ICD-10-CM | POA: Diagnosis not present

## 2020-08-31 DIAGNOSIS — L700 Acne vulgaris: Secondary | ICD-10-CM | POA: Diagnosis not present

## 2020-08-31 DIAGNOSIS — L814 Other melanin hyperpigmentation: Secondary | ICD-10-CM | POA: Diagnosis not present

## 2020-09-12 ENCOUNTER — Encounter: Payer: BC Managed Care – PPO | Admitting: Podiatry

## 2020-09-19 DIAGNOSIS — M5137 Other intervertebral disc degeneration, lumbosacral region: Secondary | ICD-10-CM | POA: Diagnosis not present

## 2020-09-19 DIAGNOSIS — M5432 Sciatica, left side: Secondary | ICD-10-CM | POA: Diagnosis not present

## 2020-09-19 DIAGNOSIS — M5136 Other intervertebral disc degeneration, lumbar region: Secondary | ICD-10-CM | POA: Diagnosis not present

## 2020-09-19 DIAGNOSIS — I700 Atherosclerosis of aorta: Secondary | ICD-10-CM | POA: Diagnosis not present

## 2020-09-25 DIAGNOSIS — G8929 Other chronic pain: Secondary | ICD-10-CM | POA: Diagnosis not present

## 2020-09-25 DIAGNOSIS — M5442 Lumbago with sciatica, left side: Secondary | ICD-10-CM | POA: Diagnosis not present

## 2020-09-25 DIAGNOSIS — M48062 Spinal stenosis, lumbar region with neurogenic claudication: Secondary | ICD-10-CM | POA: Diagnosis not present

## 2020-09-26 ENCOUNTER — Encounter: Payer: Self-pay | Admitting: Internal Medicine

## 2020-10-11 DIAGNOSIS — G8929 Other chronic pain: Secondary | ICD-10-CM | POA: Diagnosis not present

## 2020-10-11 DIAGNOSIS — M5442 Lumbago with sciatica, left side: Secondary | ICD-10-CM | POA: Diagnosis not present

## 2020-10-11 DIAGNOSIS — M48062 Spinal stenosis, lumbar region with neurogenic claudication: Secondary | ICD-10-CM | POA: Diagnosis not present

## 2020-11-06 DIAGNOSIS — M48062 Spinal stenosis, lumbar region with neurogenic claudication: Secondary | ICD-10-CM | POA: Diagnosis not present

## 2020-11-06 DIAGNOSIS — G8929 Other chronic pain: Secondary | ICD-10-CM | POA: Diagnosis not present

## 2020-11-06 DIAGNOSIS — M5442 Lumbago with sciatica, left side: Secondary | ICD-10-CM | POA: Diagnosis not present

## 2021-01-11 ENCOUNTER — Encounter: Payer: BC Managed Care – PPO | Admitting: Internal Medicine

## 2021-02-07 ENCOUNTER — Other Ambulatory Visit: Payer: Self-pay

## 2021-02-07 DIAGNOSIS — Z0000 Encounter for general adult medical examination without abnormal findings: Secondary | ICD-10-CM

## 2021-02-07 DIAGNOSIS — E785 Hyperlipidemia, unspecified: Secondary | ICD-10-CM

## 2021-02-07 DIAGNOSIS — R7303 Prediabetes: Secondary | ICD-10-CM

## 2021-02-07 DIAGNOSIS — E559 Vitamin D deficiency, unspecified: Secondary | ICD-10-CM

## 2021-02-07 DIAGNOSIS — Z1389 Encounter for screening for other disorder: Secondary | ICD-10-CM

## 2021-02-07 DIAGNOSIS — Z1329 Encounter for screening for other suspected endocrine disorder: Secondary | ICD-10-CM

## 2021-02-21 LAB — HM DIABETES EYE EXAM

## 2021-03-06 ENCOUNTER — Other Ambulatory Visit: Payer: Self-pay

## 2021-03-06 ENCOUNTER — Other Ambulatory Visit: Payer: Self-pay | Admitting: Internal Medicine

## 2021-03-06 ENCOUNTER — Ambulatory Visit (INDEPENDENT_AMBULATORY_CARE_PROVIDER_SITE_OTHER): Payer: BC Managed Care – PPO | Admitting: Internal Medicine

## 2021-03-06 ENCOUNTER — Encounter: Payer: Self-pay | Admitting: Internal Medicine

## 2021-03-06 ENCOUNTER — Encounter: Payer: BC Managed Care – PPO | Admitting: Internal Medicine

## 2021-03-06 VITALS — BP 124/80 | HR 75 | Temp 97.5°F | Ht 65.67 in | Wt 192.2 lb

## 2021-03-06 DIAGNOSIS — Z1231 Encounter for screening mammogram for malignant neoplasm of breast: Secondary | ICD-10-CM

## 2021-03-06 DIAGNOSIS — Z1211 Encounter for screening for malignant neoplasm of colon: Secondary | ICD-10-CM

## 2021-03-06 DIAGNOSIS — J329 Chronic sinusitis, unspecified: Secondary | ICD-10-CM

## 2021-03-06 DIAGNOSIS — M898X1 Other specified disorders of bone, shoulder: Secondary | ICD-10-CM

## 2021-03-06 DIAGNOSIS — E559 Vitamin D deficiency, unspecified: Secondary | ICD-10-CM

## 2021-03-06 DIAGNOSIS — Z1329 Encounter for screening for other suspected endocrine disorder: Secondary | ICD-10-CM | POA: Diagnosis not present

## 2021-03-06 DIAGNOSIS — R252 Cramp and spasm: Secondary | ICD-10-CM

## 2021-03-06 DIAGNOSIS — R0781 Pleurodynia: Secondary | ICD-10-CM

## 2021-03-06 DIAGNOSIS — K769 Liver disease, unspecified: Secondary | ICD-10-CM

## 2021-03-06 DIAGNOSIS — E119 Type 2 diabetes mellitus without complications: Secondary | ICD-10-CM

## 2021-03-06 DIAGNOSIS — U071 COVID-19: Secondary | ICD-10-CM

## 2021-03-06 DIAGNOSIS — E785 Hyperlipidemia, unspecified: Secondary | ICD-10-CM

## 2021-03-06 DIAGNOSIS — K8020 Calculus of gallbladder without cholecystitis without obstruction: Secondary | ICD-10-CM | POA: Diagnosis not present

## 2021-03-06 DIAGNOSIS — D1803 Hemangioma of intra-abdominal structures: Secondary | ICD-10-CM

## 2021-03-06 DIAGNOSIS — R7303 Prediabetes: Secondary | ICD-10-CM

## 2021-03-06 DIAGNOSIS — J40 Bronchitis, not specified as acute or chronic: Secondary | ICD-10-CM

## 2021-03-06 DIAGNOSIS — Z1389 Encounter for screening for other disorder: Secondary | ICD-10-CM

## 2021-03-06 DIAGNOSIS — Z0000 Encounter for general adult medical examination without abnormal findings: Secondary | ICD-10-CM

## 2021-03-06 DIAGNOSIS — Z720 Tobacco use: Secondary | ICD-10-CM

## 2021-03-06 DIAGNOSIS — D751 Secondary polycythemia: Secondary | ICD-10-CM

## 2021-03-06 DIAGNOSIS — K760 Fatty (change of) liver, not elsewhere classified: Secondary | ICD-10-CM

## 2021-03-06 LAB — CBC WITH DIFFERENTIAL/PLATELET
Basophils Absolute: 0 10*3/uL (ref 0.0–0.1)
Basophils Relative: 0.5 % (ref 0.0–3.0)
Eosinophils Absolute: 0.1 10*3/uL (ref 0.0–0.7)
Eosinophils Relative: 1 % (ref 0.0–5.0)
HCT: 47.3 % — ABNORMAL HIGH (ref 36.0–46.0)
Hemoglobin: 15.5 g/dL — ABNORMAL HIGH (ref 12.0–15.0)
Lymphocytes Relative: 43.2 % (ref 12.0–46.0)
Lymphs Abs: 2.2 10*3/uL (ref 0.7–4.0)
MCHC: 32.9 g/dL (ref 30.0–36.0)
MCV: 90 fl (ref 78.0–100.0)
Monocytes Absolute: 0.7 10*3/uL (ref 0.1–1.0)
Monocytes Relative: 13.1 % — ABNORMAL HIGH (ref 3.0–12.0)
Neutro Abs: 2.1 10*3/uL (ref 1.4–7.7)
Neutrophils Relative %: 42.2 % — ABNORMAL LOW (ref 43.0–77.0)
Platelets: 301 10*3/uL (ref 150.0–400.0)
RBC: 5.25 Mil/uL — ABNORMAL HIGH (ref 3.87–5.11)
RDW: 14.2 % (ref 11.5–15.5)
WBC: 5 10*3/uL (ref 4.0–10.5)

## 2021-03-06 LAB — COMPREHENSIVE METABOLIC PANEL
ALT: 19 U/L (ref 0–35)
AST: 14 U/L (ref 0–37)
Albumin: 4.1 g/dL (ref 3.5–5.2)
Alkaline Phosphatase: 86 U/L (ref 39–117)
BUN: 15 mg/dL (ref 6–23)
CO2: 29 mEq/L (ref 19–32)
Calcium: 9.6 mg/dL (ref 8.4–10.5)
Chloride: 106 mEq/L (ref 96–112)
Creatinine, Ser: 0.73 mg/dL (ref 0.40–1.20)
GFR: 94.81 mL/min (ref >60.00)
Glucose, Bld: 99 mg/dL (ref 70–99)
Potassium: 4.3 mEq/L (ref 3.5–5.1)
Sodium: 140 mEq/L (ref 135–145)
Total Bilirubin: 0.4 mg/dL (ref 0.2–1.2)
Total Protein: 7.2 g/dL (ref 6.0–8.3)

## 2021-03-06 LAB — D-DIMER, QUANTITATIVE: D-Dimer, Quant: 0.48 mcg/mL FEU (ref <0.50)

## 2021-03-06 LAB — LIPID PANEL
Cholesterol: 207 mg/dL — ABNORMAL HIGH (ref 0–200)
HDL: 40.9 mg/dL (ref >39.00)
LDL Cholesterol: 143 mg/dL — ABNORMAL HIGH (ref 0–99)
NonHDL: 165.66
Total CHOL/HDL Ratio: 5
Triglycerides: 112 mg/dL (ref 0.0–149.0)
VLDL: 22.4 mg/dL (ref 0.0–40.0)

## 2021-03-06 LAB — TSH: TSH: 1.02 u[IU]/mL (ref 0.35–5.50)

## 2021-03-06 LAB — VITAMIN D 25 HYDROXY (VIT D DEFICIENCY, FRACTURES): VITD: 12.93 ng/mL — ABNORMAL LOW (ref 30.00–100.00)

## 2021-03-06 LAB — HEMOGLOBIN A1C: Hgb A1c MFr Bld: 6.7 % — ABNORMAL HIGH (ref 4.6–6.5)

## 2021-03-06 MED ORDER — CHOLECALCIFEROL 1.25 MG (50000 UT) PO CAPS
50000.0000 [IU] | ORAL_CAPSULE | ORAL | 1 refills | Status: DC
Start: 2021-03-06 — End: 2021-11-20

## 2021-03-06 MED ORDER — ALBUTEROL SULFATE HFA 108 (90 BASE) MCG/ACT IN AERS: 1.0000 | INHALATION_SPRAY | Freq: Four times a day (QID) | RESPIRATORY_TRACT | 2 refills | Status: DC | PRN

## 2021-03-06 NOTE — Patient Instructions (Signed)
Consider covid 4th dose  Consider Tdap  Consider shingrix shingles vaccine

## 2021-03-06 NOTE — Progress Notes (Addendum)
Chief Complaint  Patient presents with   Annual Exam   Gynecologic Exam   Annual  1. C/o right scapula pian x months wants to be check gallstones h/o kidney stones and h/o b/l rotator cuff with ortho in [facility]  X few weeks on and off nothing tried  2. Pap due will call and schedule with ob/gyn    Review of Systems  Constitutional:  Negative for weight loss.  HENT:  Negative for hearing loss.   Eyes:  Negative for blurred vision.  Respiratory:  Negative for shortness of breath.   Cardiovascular:  Negative for chest pain.  Gastrointestinal:  Negative for abdominal pain and blood in stool.  Genitourinary:  Negative for dysuria.  Musculoskeletal:  Negative for falls and joint pain.  Skin:  Negative for rash.  Neurological:  Negative for headaches.  Psychiatric/Behavioral:  Negative for depression.   Past Medical History:  Diagnosis Date   Chicken pox    GERD (gastroesophageal reflux disease)    History of kidney stones    passed 2 , litrotrispy   Hyperlipidemia    Liver lesion    hemangioma vs FNH vs adenoma noted MRI 2008 (Dr. Allen Norris)    Pneumonia    Pre-diabetes    Prediabetes    Trigger finger of left thumb    Past Surgical History:  Procedure Laterality Date   ABDOMINAL HYSTERECTOMY     2006/2007 for endometriosis    BACK SURGERY     L4/5 discectomy 09/18/2015 Dr. Lynann Bologna    breast reduction     [place] LITHOTRIPSY Right 06/07/2015   Procedure: EXTRACORPOREAL SHOCK WAVE LITHOTRIPSY (ESWL);  Surgeon: Royston Cowper, MD;  Location: [facility];  Service: Urology;  Laterality: Right;   RADIOLOGY WITH ANESTHESIA N/A 07/19/2015   Procedure: MRI LUMBER SPIN WITHOUT;  Surgeon: Medication Radiologist, MD;  Location: [place];  Service: Radiology;  Laterality: N/A;   RADIOLOGY WITH ANESTHESIA N/A 09/22/2017   Procedure: MRI LUMBAR SPINE WITHOUT CONTRAST;  Surgeon: Radiologist, Medication, MD;  Location: [place];  Service: Radiology;  Laterality: N/A;    SHOULDER OPEN ROTATOR CUFF REPAIR Bilateral 2007 2008   right 2007, left 2008    TRIGGER FINGER RELEASE Left 02/18/2016   Procedure: LEFT TRIGGER THUMB RELEASE;  Surgeon: Milly Jakob, MD;  Location: [place];  Service: Orthopedics;  Laterality: Left;   WISDOM TOOTH EXTRACTION     Family History  Problem Relation Age of Onset   Arthritis Mother    Early death Mother    Heart disease Mother        MVP with repair died from anestheisa    Hyperlipidemia Mother    Hypertension Mother    Kidney disease Mother    Pulmonary embolism Mother    Early death Father    Diabetes Father    Hyperlipidemia Father    Heart disease Father    Hypertension Father    Kidney disease Father    Mitral valve prolapse Father    Deep vein thrombosis Father    Pulmonary embolism Father        caused him to die   Diabetes Mellitus I Father    Kidney failure Father        s/p transplant   Diabetes Maternal Grandmother    Heart disease Maternal Grandmother    Heart disease Maternal Grandfather    Early death Paternal Grandmother    Diabetes Paternal Grandmother    Cancer Paternal  Grandfather        prostate    Asthma Daughter    Depression Daughter    Bipolar disorder Daughter    Social History   Socioeconomic History   Marital status: Married    Spouse name: Not on file   Number of children: Not on file   Years of education: Not on file   Highest education level: Not on file  Occupational History   Not on file  Tobacco Use   Smoking status: Every Day    Packs/day: 0.50    Years: 20.00    Pack years: 10.00    Types: Cigarettes   Smokeless tobacco: Never  Substance and Sexual Activity   Alcohol use: No   Drug use: No   Sexual activity: Yes  Other Topics Concern   Not on file  Social History Narrative   Married    2 kids son going to army   Daughter going to [employer] fall 2019    Owns guns    Wears seat belt    Safe in relationship    Social Determinants of Health    Financial Resource Strain: Not on file  Food Insecurity: Not on file  Transportation Needs: Not on file  Physical Activity: Not on file  Stress: Not on file  Social Connections: Not on file  Intimate Partner Violence: Not on file   Current Meds  Medication Sig   Cholecalciferol 1.25 MG (50000 UT) capsule Take 1 capsule (50,000 Units total) by mouth once a week. D/c after 6 months and take vitamin D3 4000 IU daily otc   No Known Allergies No results found for this or any previous visit (from the past 2160 hour(s)). Objective  Body mass index is 31.34 kg/m. Wt Readings from Last 3 Encounters:  03/06/21 192 lb 3.2 oz (87.2 kg)  07/19/20 186 lb (84.4 kg)  12/22/19 172 lb (78 kg)   Temp Readings from Last 3 Encounters:  03/06/21 (!) 97.5 F (36.4 C) (Temporal)  12/22/19 97.9 F (36.6 C) (Oral)  11/16/18 98.2 F (36.8 C) (Oral)   BP Readings from Last 3 Encounters:  03/06/21 124/80  12/22/19 124/80  11/16/18 117/86   Pulse Readings from Last 3 Encounters:  03/06/21 75  12/22/19 61  11/16/18 75    Physical Exam Vitals and nursing note reviewed.  Constitutional:      Appearance: Normal appearance. She is well-developed and well-groomed.  HENT:     Head: Normocephalic and atraumatic.  Eyes:     Conjunctiva/sclera: Conjunctivae normal.     Pupils: Pupils are equal, round, and reactive to light.  Cardiovascular:     Rate and Rhythm: Normal rate and regular rhythm.     Heart sounds: Normal heart sounds. No murmur heard. Pulmonary:     Effort: Pulmonary effort is normal.     Breath sounds: Normal breath sounds.  Abdominal:     General: Abdomen is flat. Bowel sounds are normal.     Tenderness: There is no abdominal tenderness.  Musculoskeletal:        General: No tenderness.  Skin:    General: Skin is warm and dry.  Neurological:     General: No focal deficit present.     Mental Status: She is alert and oriented to person, place, and time. Mental status is at  baseline.     Cranial Nerves: Cranial nerves 2-12 are intact.     Gait: Gait is intact.  Psychiatric:  Attention and Perception: Attention and perception normal.        Mood and Affect: Mood and affect normal.        Speech: Speech normal.        Behavior: Behavior normal. Behavior is cooperative.        Thought Content: Thought content normal.        Cognition and Memory: Cognition and memory normal.        Judgment: Judgment normal.    Assessment  Plan  Annual physical exam - Plan: labs today  See below  Gallstones - Plan: US Abdomen Complete  Pain of right scapula r/o DVT- Plan: US Abdomen Complete Check D dimer h/o PE/DVT parents   Prediabetes - Plan: Microalbumin / creatinine urine ratio, Hemoglobin A1c  Hyperlipidemia, unspecified hyperlipidemia type - Plan: Lipid panel  Vitamin D deficiency - Plan: VITAMIN D 25 Hydroxy (Vit-D Deficiency, Fractures)   HM Never flu shot  Tdap declines, pna shots declines 03/06/21  3/3 moderna declines 4th dose MMR immune and hep B immune  Ordered hep C lab neg 12/23/19    mammo [facility]   Get copy prior pap [facility] s/p hysterectomy in 2006/2007 for endometriosis she reports h/o abnormal pap had total hysterectomy  -consider pap in future at f/u h/o abnormal pap [facility]    rec smoking cessation smoker since age 54 y.o max 1 ppd now < 1ppd    Colonoscopy referred Dr. Alice Reichert GI    [facility] 02/21/21 eye exam no mention of dm changes in eye but not sure if screened per the notes   Dentist Dr. Bronson Curb   Provider: Dr. Olivia Mackie McLean-Scocuzza-Internal Medicine

## 2021-03-07 ENCOUNTER — Encounter: Payer: Self-pay | Admitting: Internal Medicine

## 2021-03-07 LAB — MICROALBUMIN / CREATININE URINE RATIO
Creatinine, Urine: 187 mg/dL (ref 20–275)
Microalb Creat Ratio: 5 mcg/mg creat (ref <30)
Microalb, Ur: 0.9 mg/dL

## 2021-03-07 LAB — URINALYSIS, ROUTINE W REFLEX MICROSCOPIC
Bilirubin Urine: NEGATIVE
Glucose, UA: NEGATIVE
Hgb urine dipstick: NEGATIVE
Ketones, ur: NEGATIVE
Leukocytes,Ua: NEGATIVE
Nitrite: NEGATIVE
Protein, ur: NEGATIVE
Specific Gravity, Urine: 1.025 (ref 1.001–1.035)
pH: 6.5 (ref 5.0–8.0)

## 2021-03-07 MED ORDER — SIMVASTATIN 10 MG PO TABS: 10.0000 mg | ORAL_TABLET | Freq: Every day | ORAL | 3 refills | Status: DC

## 2021-03-07 NOTE — Addendum Note (Signed)
Addended by: Orland Mustard on: 03/07/2021 09:44 AM   Modules accepted: Orders

## 2021-03-13 ENCOUNTER — Other Ambulatory Visit: Payer: Self-pay

## 2021-03-13 ENCOUNTER — Ambulatory Visit
Admission: RE | Admit: 2021-03-13 | Discharge: 2021-03-13 | Disposition: A | Discharge status: Home / Self Care | Payer: BC Managed Care – PPO | Source: Ambulatory Visit | Attending: Internal Medicine | Admitting: Internal Medicine

## 2021-03-13 DIAGNOSIS — K8020 Calculus of gallbladder without cholecystitis without obstruction: Secondary | ICD-10-CM | POA: Insufficient documentation

## 2021-03-13 DIAGNOSIS — M898X1 Other specified disorders of bone, shoulder: Secondary | ICD-10-CM | POA: Diagnosis not present

## 2021-03-13 DIAGNOSIS — K7689 Other specified diseases of liver: Secondary | ICD-10-CM | POA: Diagnosis not present

## 2021-03-14 ENCOUNTER — Encounter: Payer: Self-pay | Admitting: Internal Medicine

## 2021-03-14 DIAGNOSIS — K760 Fatty (change of) liver, not elsewhere classified: Secondary | ICD-10-CM | POA: Insufficient documentation

## 2021-03-14 DIAGNOSIS — K769 Liver disease, unspecified: Secondary | ICD-10-CM | POA: Insufficient documentation

## 2021-03-14 DIAGNOSIS — D1803 Hemangioma of intra-abdominal structures: Secondary | ICD-10-CM | POA: Insufficient documentation

## 2021-03-14 NOTE — Addendum Note (Signed)
Addended by: Orland Mustard on: 03/14/2021 06:05 PM   Modules accepted: Orders

## 2021-03-19 NOTE — Addendum Note (Signed)
Addended by: Orland Mustard on: 03/19/2021 12:03 PM   Modules accepted: Orders

## 2021-04-04 DIAGNOSIS — Z01818 Encounter for other preprocedural examination: Secondary | ICD-10-CM | POA: Diagnosis not present

## 2021-04-04 DIAGNOSIS — Z1211 Encounter for screening for malignant neoplasm of colon: Secondary | ICD-10-CM | POA: Diagnosis not present

## 2021-04-08 ENCOUNTER — Encounter (HOSPITAL_COMMUNITY): Payer: Self-pay | Admitting: *Deleted

## 2021-04-08 ENCOUNTER — Encounter: Payer: Self-pay | Admitting: Internal Medicine

## 2021-04-08 ENCOUNTER — Other Ambulatory Visit: Payer: Self-pay

## 2021-04-08 NOTE — Progress Notes (Addendum)
Mrs. [place] denies chest pain or shortness of breath. Patient denies having any s/s of Covid in her household.  Patient denies any known exposure to Covid.   Mrs. Teaster's GI MD is Dr. Olean Ree.  Mrs. Colla wears contacts, patient reports that she cannot see without them. I asked patient to bring contact case.  I instructed Mrs. Montemayor to shower with antibiotic soap, if it is available.  Dry off with a clean towel. Do not put lotion, powder, cologne or deodorant or makeup.No jewelry or piercings. Men may shave their face and neck. Woman should not shave. No nail polish, artificial or acrylic nails. Wear clean clothes, brush your teeth. Glasses, contact lens,dentures or partials may not be worn in the OR. If you need to wear them, please bring a case for glasses, do not wear contacts or bring a case, the hospital does not have contact cases, dentures or partials will have to be removed , make sure they are clean, we will provide a denture cup to put them in. You will need some one to drive you home and a responsible person over the age of 29 to stay with you for the first 24 hours after surgery.

## 2021-04-09 ENCOUNTER — Encounter (HOSPITAL_COMMUNITY): Payer: Self-pay | Admitting: Internal Medicine

## 2021-04-09 ENCOUNTER — Encounter (HOSPITAL_COMMUNITY): Payer: Self-pay

## 2021-04-09 ENCOUNTER — Encounter (HOSPITAL_COMMUNITY)
Admission: RE | Disposition: A | Discharge status: Home / Self Care | Payer: Self-pay | Source: Ambulatory Visit | Attending: Internal Medicine

## 2021-04-09 ENCOUNTER — Encounter: Payer: Self-pay | Admitting: Internal Medicine

## 2021-04-09 ENCOUNTER — Ambulatory Visit (HOSPITAL_COMMUNITY): Payer: BC Managed Care – PPO | Admitting: Certified Registered Nurse Anesthetist

## 2021-04-09 ENCOUNTER — Ambulatory Visit (HOSPITAL_COMMUNITY)
Admission: RE | Admit: 2021-04-09 | Discharge: 2021-04-09 | Disposition: A | Discharge status: Home / Self Care | Payer: BC Managed Care – PPO | Source: Ambulatory Visit | Attending: Internal Medicine | Admitting: Internal Medicine

## 2021-04-09 DIAGNOSIS — Z538 Procedure and treatment not carried out for other reasons: Secondary | ICD-10-CM | POA: Insufficient documentation

## 2021-04-09 DIAGNOSIS — K769 Liver disease, unspecified: Secondary | ICD-10-CM | POA: Diagnosis not present

## 2021-04-09 DIAGNOSIS — D1803 Hemangioma of intra-abdominal structures: Secondary | ICD-10-CM

## 2021-04-09 DIAGNOSIS — K760 Fatty (change of) liver, not elsewhere classified: Secondary | ICD-10-CM | POA: Diagnosis not present

## 2021-04-09 HISTORY — PX: RADIOLOGY WITH ANESTHESIA: SHX6223

## 2021-04-09 LAB — GLUCOSE, CAPILLARY: Glucose-Capillary: 133 mg/dL — ABNORMAL HIGH (ref 70–99)

## 2021-04-09 SURGERY — MRI WITH ANESTHESIA
Anesthesia: General

## 2021-04-09 NOTE — Telephone Encounter (Signed)
Patient scheduled for 04/17/21

## 2021-04-09 NOTE — Telephone Encounter (Signed)
Called and spoke to the Patient. Informed her that we did receive a call from the hospital this morning at 7:27 am. Our phones do not turn on until 8:00 am. Received a fax for the over night answering service. Called the nurse and was informed that they are needing an updated history and physical within 30 days of the MRI.   Patient verbalized understanding that they did not ask for this update until this morning and Dr Olivia Mackie McLean-Scocuzza was already in with patients for the day. Patient scheduled to come in 04/17/21 and we can do an updated history and physical.

## 2021-04-09 NOTE — Progress Notes (Signed)
MRI will have to be rescheduled due to patient not having updated H&P within 30 days of MRI.  MRI, Dr. Roanna Banning, CRNA, and patient all aware.

## 2021-04-09 NOTE — Anesthesia Preprocedure Evaluation (Deleted)
Anesthesia Evaluation    Reviewed: Allergy & Precautions, Patient's Chart, lab work & pertinent test results  Airway        Dental   Pulmonary Current Smoker,           Cardiovascular negative cardio ROS       Neuro/Psych negative neurological ROS     GI/Hepatic negative GI ROS, Neg liver ROS,   Endo/Other  negative endocrine ROS  Renal/GU negative Renal ROS     Musculoskeletal  (+) Arthritis ,   Abdominal (+) + obese,   Peds  Hematology negative hematology ROS (+)   Anesthesia Other Findings LIVER LESION LIVER DISEASE; FATTY LIVER  Reproductive/Obstetrics                             Anesthesia Physical Anesthesia Plan  ASA: 2  Anesthesia Plan:    Post-op Pain Management:    Induction:   PONV Risk Score and Plan:   Airway Management Planned:   Additional Equipment:   Intra-op Plan:   Post-operative Plan:   Informed Consent:   Plan Discussed with:   Anesthesia Plan Comments:         Anesthesia Quick Evaluation

## 2021-04-10 ENCOUNTER — Encounter (HOSPITAL_COMMUNITY): Payer: Self-pay | Admitting: Radiology

## 2021-04-16 DIAGNOSIS — Z20822 Contact with and (suspected) exposure to covid-19: Secondary | ICD-10-CM | POA: Diagnosis not present

## 2021-04-16 DIAGNOSIS — Z03818 Encounter for observation for suspected exposure to other biological agents ruled out: Secondary | ICD-10-CM | POA: Diagnosis not present

## 2021-04-17 ENCOUNTER — Encounter: Payer: Self-pay | Admitting: Internal Medicine

## 2021-04-17 ENCOUNTER — Other Ambulatory Visit: Payer: Self-pay

## 2021-04-17 ENCOUNTER — Ambulatory Visit: Payer: BC Managed Care – PPO | Admitting: Internal Medicine

## 2021-04-17 ENCOUNTER — Telehealth (INDEPENDENT_AMBULATORY_CARE_PROVIDER_SITE_OTHER): Payer: BC Managed Care – PPO | Admitting: Internal Medicine

## 2021-04-17 VITALS — Ht 65.0 in | Wt 192.0 lb

## 2021-04-17 DIAGNOSIS — K760 Fatty (change of) liver, not elsewhere classified: Secondary | ICD-10-CM

## 2021-04-17 DIAGNOSIS — E669 Obesity, unspecified: Secondary | ICD-10-CM | POA: Diagnosis not present

## 2021-04-17 DIAGNOSIS — R160 Hepatomegaly, not elsewhere classified: Secondary | ICD-10-CM | POA: Diagnosis not present

## 2021-04-17 DIAGNOSIS — U071 COVID-19: Secondary | ICD-10-CM | POA: Diagnosis not present

## 2021-04-17 DIAGNOSIS — E119 Type 2 diabetes mellitus without complications: Secondary | ICD-10-CM

## 2021-04-17 DIAGNOSIS — J321 Chronic frontal sinusitis: Secondary | ICD-10-CM

## 2021-04-17 MED ORDER — PREDNISONE 20 MG PO TABS: 40.0000 mg | ORAL_TABLET | Freq: Every day | ORAL | 0 refills | Status: DC

## 2021-04-17 MED ORDER — TIRZEPATIDE 10 MG/0.5ML ~~LOC~~ SOAJ: 10.0000 mg | SUBCUTANEOUS | 0 refills | Status: DC

## 2021-04-17 MED ORDER — TIRZEPATIDE 5 MG/0.5ML ~~LOC~~ SOAJ: 5.0000 mg | SUBCUTANEOUS | 0 refills | Status: DC

## 2021-04-17 MED ORDER — TIRZEPATIDE 2.5 MG/0.5ML ~~LOC~~ SOAJ: 2.5000 mg | SUBCUTANEOUS | 0 refills | Status: DC

## 2021-04-17 MED ORDER — TIRZEPATIDE 7.5 MG/0.5ML ~~LOC~~ SOAJ: 7.5000 mg | SUBCUTANEOUS | 0 refills | Status: DC

## 2021-04-17 MED ORDER — TIRZEPATIDE 12.5 MG/0.5ML ~~LOC~~ SOAJ
12.5000 mg | SUBCUTANEOUS | 1 refills | Status: DC
Start: 2021-04-17 — End: 2021-05-21

## 2021-04-17 MED ORDER — AZITHROMYCIN 250 MG PO TABS: ORAL_TABLET | ORAL | 0 refills | Status: AC

## 2021-04-17 NOTE — Patient Instructions (Addendum)
Tirzepatide Injection What is this medication? TIRZEPATIDE (tir ZEP a tide) treats type 2 diabetes. It works by increasing insulin levels in your body, which decreases your blood sugar (glucose). Changes to diet and exercise are often combined with this medication. This medicine may be used for other purposes; ask your health care provider or pharmacist if you have questions. COMMON BRAND NAME(S): MOUNJARO What should I tell my care team before I take this medication? They need to know if you have any of these conditions: Endocrine tumors (MEN 2) or if someone in your family had these tumors Eye disease, vision problems Gallbladder disease History of pancreatitis Kidney disease Stomach or intestine problems Thyroid cancer or if someone in your family had thyroid cancer An unusual or allergic reaction to tirzepatide, other medications, foods, dyes, or preservatives Pregnant or trying to get pregnant Breast-feeding How should I use this medication? This medication is injected under the skin. You will be taught how to prepare and give it. It is given once every week (every 7 days). Keep taking it unless your health care provider tells you to stop. If you use this medication with insulin, you should inject this medication and the insulin separately. Do not mix them together. Do not give the injections right next to each other. Change (rotate) injection sites with each injection. This medication comes with INSTRUCTIONS FOR USE. Ask your pharmacist for directions on how to use this medication. Read the information carefully. Talk to your pharmacist or care team if you have questions. It is important that you put your used needles and syringes in a special sharps container. Do not put them in a trash can. If you do not have a sharps container, call your pharmacist or care team to get one. A special MedGuide will be given to you by the pharmacist with each prescription and refill. Be sure to read  this information carefully each time. Talk to your care team about the use of this medication in children. Special care may be needed. Overdosage: If you think you have taken too much of this medicine contact a poison control center or emergency room at once. NOTE: This medicine is only for you. Do not share this medicine with others. What if I miss a dose? If you miss a dose, take it as soon as you can unless it is more than 4 days (96 hours) late. If it is more than 4 days late, skip the missed dose. Take the next dose at the normal time. Do not take 2 doses within 3 days of each other. What may interact with this medication? Alcohol containing beverages Antiviral medications for HIV or AIDS Aspirin and aspirin-like medications Beta-blockers like atenolol, metoprolol, propranolol Certain medications for blood pressure, heart disease, irregular heart beat Chromium Clonidine Diuretics Female hormones, such as estrogens or progestins, birth control pills Fenofibrate Gemfibrozil Guanethidine Isoniazid Lanreotide Female hormones or anabolic steroids MAOIs like Carbex, Eldepryl, Marplan, Nardil, and Parnate Medications for weight loss Medications for allergies, asthma, cold, or cough Medications for depression, anxiety, or psychotic disturbances Niacin Nicotine NSAIDs, medications for pain and inflammation, like ibuprofen or naproxen Octreotide Other medications for diabetes, like glyburide, glipizide, or glimepiride Pasireotide Pentamidine Phenytoin Probenecid Quinolone antibiotics such as ciprofloxacin, levofloxacin, ofloxacin Reserpine Some herbal dietary supplements Steroid medications such as prednisone or cortisone Sulfamethoxazole; trimethoprim Thyroid hormones Warfarin This list may not describe all possible interactions. Give your health care provider a list of all the medicines, herbs, non-prescription drugs, or dietary supplements you  use. Also tell them if you smoke,  drink alcohol, or use illegal drugs. Some items may interact with your medicine. What should I watch for while using this medication? Visit your care team for regular checks on your progress. Drink plenty of fluids while taking this medication. Check with your care team if you get an attack of severe diarrhea, nausea, and vomiting. The loss of too much body fluid can make it dangerous for you to take this medication. A test called the HbA1C (A1C) will be monitored. This is a simple blood test. It measures your blood sugar control over the last 2 to 3 months. You will receive this test every 3 to 6 months. Learn how to check your blood sugar. Learn the symptoms of low and high blood sugar and how to manage them. Always carry a quick-source of sugar with you in case you have symptoms of low blood sugar. Examples include hard sugar candy or glucose tablets. Make sure others know that you can choke if you eat or drink when you develop serious symptoms of low blood sugar, such as seizures or unconsciousness. They must get medical help at once. Tell your care team if you have high blood sugar. You might need to change the dose of your medication. If you are sick or exercising more than usual, you might need to change the dose of your medication. Do not skip meals. Ask your care team if you should avoid alcohol. Many nonprescription cough and cold products contain sugar or alcohol. These can affect blood sugar. Pens should never be shared. Even if the needle is changed, sharing may result in passing of viruses like hepatitis or HIV. Wear a medical ID bracelet or chain, and carry a card that describes your disease and details of your medication and dosage times. Birth control may not work properly while you are taking this medication. If you take birth control pills by mouth, your care team may recommend another type of birth control for 4 weeks after you start this medication and for 4 weeks after each increase  in your dose of this medication. Ask your care team which birth control methods you should use. What side effects may I notice from receiving this medication? Side effects that you should report to your care team as soon as possible: Allergic reactions--skin rash, itching, hives, swelling of the face, lips, tongue, or throat Change in vision Dehydration--increased thirst, dry mouth, feeling faint or lightheaded, headache, dark yellow or brown urine Gallbladder problems--severe stomach pain, nausea, vomiting, fever Kidney injury--decrease in the amount of urine, swelling of the ankles, hands, or feet Pancreatitis--severe stomach pain that spreads to your back or gets worse after eating or when touched, fever, nausea, vomiting Thyroid cancer--new mass or lump in the neck, pain or trouble swallowing, trouble breathing, hoarseness Side effects that usually do not require medical attention (report these to your care team if they continue or are bothersome): Constipation Diarrhea Loss of Appetite Nausea Stomach pain Upset stomach Vomiting This list may not describe all possible side effects. Call your doctor for medical advice about side effects. You may report side effects to FDA at 1-800-FDA-1088. Where should I keep my medication? Keep out of the reach of children and pets. Refrigeration (preferred): Store unopened pens in a refrigerator between 2 and 8 degrees C (36 and 46 degrees F). Keep it in the original carton until you are ready to take it. Do not freeze or use if the medication has been frozen.  Protect from light. Get rid of any unused medication after the expiration date on the label. Room Temperature: The pen may be stored at room temperature below 30 degrees C (86 degrees F) for up to a total of 21 days if needed. Protect from light. Avoid exposure to extreme heat. If it is stored at room temperature, throw away any unused medication after 21 days or after it expires, whichever is  first. The pen has glass parts. Handle it carefully. If you drop the pen on a hard surface, do not use it. Use a new pen for your injection. To get rid of medications that are no longer needed or have expired: Take the medication to a medication take-back program. Check with your pharmacy or law enforcement to find a location. If you cannot return the medication, ask your pharmacist or care team how to get rid of this medication safely. NOTE: This sheet is a summary. It may not cover all possible information. If you have questions about this medicine, talk to your doctor, pharmacist, or health care provider.  2022 Elsevier/Gold Standard (2020-08-08 00:00:00)  Semaglutide Injection What is this medication? SEMAGLUTIDE (SEM a GLOO tide) treats type 2 diabetes. It works by increasing insulin levels in your body, which decreases your blood sugar (glucose). It also reduces the amount of sugar released into the blood and slows down your digestion. It can also be used to lower the risk of heart attack and stroke in people with type 2 diabetes. Changes to diet and exercise are often combined with this medication. This medicine may be used for other purposes; ask your health care provider or pharmacist if you have questions. COMMON BRAND NAME(S): OZEMPIC What should I tell my care team before I take this medication? They need to know if you have any of these conditions: Endocrine tumors (MEN 2) or if someone in your family had these tumors Eye disease, vision problems History of pancreatitis Kidney disease Stomach problems Thyroid cancer or if someone in your family had thyroid cancer An unusual or allergic reaction to semaglutide, other medications, foods, dyes, or preservatives Pregnant or trying to get pregnant Breast-feeding How should I use this medication? This medication is for injection under the skin of your upper leg (thigh), stomach area, or upper arm. It is given once every week (every 7  days). You will be taught how to prepare and give this medication. Use exactly as directed. Take your medication at regular intervals. Do not take it more often than directed. If you use this medication with insulin, you should inject this medication and the insulin separately. Do not mix them together. Do not give the injections right next to each other. Change (rotate) injection sites with each injection. It is important that you put your used needles and syringes in a special sharps container. Do not put them in a trash can. If you do not have a sharps container, call your pharmacist or care team to get one. A special MedGuide will be given to you by the pharmacist with each prescription and refill. Be sure to read this information carefully each time. This medication comes with INSTRUCTIONS FOR USE. Ask your pharmacist for directions on how to use this medication. Read the information carefully. Talk to your pharmacist or care team if you have questions. Talk to your care team about the use of this medication in children. Special care may be needed. Overdosage: If you think you have taken too much of this medicine contact a  poison control center or emergency room at once. NOTE: This medicine is only for you. Do not share this medicine with others. What if I miss a dose? If you miss a dose, take it as soon as you can within 5 days after the missed dose. Then take your next dose at your regular weekly time. If it has been longer than 5 days after the missed dose, do not take the missed dose. Take the next dose at your regular time. Do not take double or extra doses. If you have questions about a missed dose, contact your care team for advice. What may interact with this medication? Other medications for diabetes Many medications may cause changes in blood sugar, these include: Alcohol containing beverages Antiviral medications for HIV or AIDS Aspirin and aspirin-like medications Certain medications  for blood pressure, heart disease, irregular heart beat Chromium Diuretics Female hormones, such as estrogens or progestins, birth control pills Fenofibrate Gemfibrozil Isoniazid Lanreotide Female hormones or anabolic steroids MAOIs like Carbex, Eldepryl, Marplan, Nardil, and Parnate Medications for weight loss Medications for allergies, asthma, cold, or cough Medications for depression, anxiety, or psychotic disturbances Niacin Nicotine NSAIDs, medications for pain and inflammation, like ibuprofen or naproxen Octreotide Pasireotide Pentamidine Phenytoin Probenecid Quinolone antibiotics such as ciprofloxacin, levofloxacin, ofloxacin Some herbal dietary supplements Steroid medications such as prednisone or cortisone Sulfamethoxazole; trimethoprim Thyroid hormones Some medications can hide the warning symptoms of low blood sugar (hypoglycemia). You may need to monitor your blood sugar more closely if you are taking one of these medications. These include: Beta-blockers, often used for high blood pressure or heart problems (examples include atenolol, metoprolol, propranolol) Clonidine Guanethidine Reserpine This list may not describe all possible interactions. Give your health care provider a list of all the medicines, herbs, non-prescription drugs, or dietary supplements you use. Also tell them if you smoke, drink alcohol, or use illegal drugs. Some items may interact with your medicine. What should I watch for while using this medication? Visit your care team for regular checks on your progress. Drink plenty of fluids while taking this medication. Check with your care team if you get an attack of severe diarrhea, nausea, and vomiting. The loss of too much body fluid can make it dangerous for you to take this medication. A test called the HbA1C (A1C) will be monitored. This is a simple blood test. It measures your blood sugar control over the last 2 to 3 months. You will receive this  test every 3 to 6 months. Learn how to check your blood sugar. Learn the symptoms of low and high blood sugar and how to manage them. Always carry a quick-source of sugar with you in case you have symptoms of low blood sugar. Examples include hard sugar candy or glucose tablets. Make sure others know that you can choke if you eat or drink when you develop serious symptoms of low blood sugar, such as seizures or unconsciousness. They must get medical help at once. Tell your care team if you have high blood sugar. You might need to change the dose of your medication. If you are sick or exercising more than usual, you might need to change the dose of your medication. Do not skip meals. Ask your care team if you should avoid alcohol. Many nonprescription cough and cold products contain sugar or alcohol. These can affect blood sugar. Pens should never be shared. Even if the needle is changed, sharing may result in passing of viruses like hepatitis or HIV. Wear a  medical ID bracelet or chain, and carry a card that describes your disease and details of your medication and dosage times. Do not become pregnant while taking this medication. Women should inform their care team if they wish to become pregnant or think they might be pregnant. There is a potential for serious side effects to an unborn child. Talk to your care team for more information. What side effects may I notice from receiving this medication? Side effects that you should report to your care team as soon as possible: Allergic reactions--skin rash, itching, hives, swelling of the face, lips, tongue, or throat Change in vision Dehydration--increased thirst, dry mouth, feeling faint or lightheaded, headache, dark yellow or brown urine Gallbladder problems--severe stomach pain, nausea, vomiting, fever Heart palpitations--rapid, pounding, or irregular heartbeat Kidney injury--decrease in the amount of urine, swelling of the ankles, hands, or  feet Pancreatitis--severe stomach pain that spreads to your back or gets worse after eating or when touched, fever, nausea, vomiting Thyroid cancer--new mass or lump in the neck, pain or trouble swallowing, trouble breathing, hoarseness Side effects that usually do not require medical attention (report to your care team if they continue or are bothersome): Diarrhea Loss of appetite Nausea Stomach pain Vomiting This list may not describe all possible side effects. Call your doctor for medical advice about side effects. You may report side effects to FDA at 1-800-FDA-1088. Where should I keep my medication? Keep out of the reach of children. Store unopened pens in a refrigerator between 2 and 8 degrees C (36 and 46 degrees F). Do not freeze. Protect from light and heat. After you first use the pen, it can be stored for 56 days at room temperature between 15 and 30 degrees C (59 and 86 degrees F) or in a refrigerator. Throw away your used pen after 56 days or after the expiration date, whichever comes first. Do not store your pen with the needle attached. If the needle is left on, medication may leak from the pen. NOTE: This sheet is a summary. It may not cover all possible information. If you have questions about this medicine, talk to your doctor, pharmacist, or health care provider.  2022 Elsevier/Gold Standard (2020-06-14 00:00:00)   If needing prescription strength medication we will need to make an appointment with a provider.  These are over the counter medication options:  Mucinex dm green label for cough or robitussin DM  Multivitamin or below vitamins  Vitamin C 1000 mg daily.  Vitamin D3 4000 Iu (units) daily.  Zinc 100 mg daily.  Quercetin 250-500 mg 2 times per day   Elderberry  Oil of oregano  cepacol or chloroseptic spray Warm salt water gargles +hydrogen peroxide Sugar free cough drops  Warm tea with honey and lemon  Hydration  Try to eat though you dont feel like it    Tylenol or Advil  Nasal saline and Flonase 2 sprays nasal congestion  If sneezing/runny nose over the counter allergy pill claritin,allegra, zyrtec, xyzal Quarantine x 10-14 days 14 days preferred   Monitor pulse oximeter, buy from [employer] if oxygen is less than 90 please go to the hospital.        Are you feeling really sick? Shortness of breath, cough, chest pain?, dizziness? Confusion   If so let me know  If worsening, go to hospital or [facility] clinic Urgent care for further treatment.

## 2021-04-17 NOTE — Progress Notes (Signed)
Telephone Note  I connected with Lori Lang   on 04/17/21 at  7:50 AM EST by telephone and verified that I am speaking with the correct person using two identifiers.  Location patient: [place] Location provider:work or home office Persons participating in the virtual visit: patient, provider  I discussed the limitations and requested verbal permission for telemedicine visit. The patient expressed understanding and agreed to proceed.   HPI:  Acute telemedicine visit for : Covid 19 + chills worse Saturday sinus pressure, cough tested + satuday feeling better today declines covid 19 pill  Dm2 and obesity wants weight loss medication disc mounjaro/ozempic  3. Liver lesion need to reschedule MRI with sedation [place] but needs H&P w/in 30 day  -Pertinent medication allergies:No Known Allergies -COVID-19 vaccine status:  Immunization History  Administered Date(s) Administered   Moderna SARS-COV2 Booster Vaccination 06/22/2020   Moderna Sars-Covid-2 Vaccination 08/10/2019, 09/07/2019     ROS: See pertinent positives and negatives per HPI.  Past Medical History:  Diagnosis Date   Chicken pox    COVID-19    03/2020, 03/2021   Family history of adverse reaction to anesthesia 1996   mother did not wake up   GERD (gastroesophageal reflux disease)    History of kidney stones    passed 3 , litrotrispy   Hyperlipidemia    Liver lesion    hemangioma vs FNH vs adenoma noted MRI 2008 (Dr. Allen Norris)    Pneumonia    Pre-diabetes    Prediabetes    Trigger finger of left thumb     Past Surgical History:  Procedure Laterality Date   ABDOMINAL HYSTERECTOMY     2006/2007 for endometriosis    BACK SURGERY     L4/5 discectomy 09/18/2015 Dr. Lynann Bologna    breast reduction     [place] LITHOTRIPSY Right 06/07/2015   Procedure: EXTRACORPOREAL SHOCK WAVE LITHOTRIPSY (ESWL);  Surgeon: Royston Cowper, MD;  Location: [facility];  Service: Urology;  Laterality: Right;   RADIOLOGY  WITH ANESTHESIA N/A 07/19/2015   Procedure: MRI LUMBER SPIN WITHOUT;  Surgeon: Medication Radiologist, MD;  Location: [facility];  Service: Radiology;  Laterality: N/A;   RADIOLOGY WITH ANESTHESIA N/A 09/22/2017   Procedure: MRI LUMBAR SPINE WITHOUT CONTRAST;  Surgeon: Radiologist, Medication, MD;  Location: [facility];  Service: Radiology;  Laterality: N/A;   RADIOLOGY WITH ANESTHESIA N/A 04/09/2021   Procedure: MRI LIVER WITH AND WITHOUT CONTRAST WITH ANESTHESIA;  Surgeon: Radiologist, Medication, MD;  Location: [place];  Service: Radiology;  Laterality: N/A;   SHOULDER OPEN ROTATOR CUFF REPAIR Bilateral 2007 2008   right 2007, left 2008    TRIGGER FINGER RELEASE Left 02/18/2016   Procedure: LEFT TRIGGER THUMB RELEASE;  Surgeon: Milly Jakob, MD;  Location: [facility];  Service: Orthopedics;  Laterality: Left;   WISDOM TOOTH EXTRACTION       Current Outpatient Medications:    azithromycin (ZITHROMAX) 250 MG tablet, Take 2 tablets on day 1, then 1 tablet daily on days 2 through 5, Disp: 6 tablet, Rfl: 0   predniSONE (DELTASONE) 20 MG tablet, Take 2 tablets (40 mg total) by mouth daily with breakfast. X 5-7 days, Disp: 14 tablet, Rfl: 0   simvastatin (ZOCOR) 10 MG tablet, Take 1 tablet (10 mg total) by mouth at bedtime., Disp: 90 tablet, Rfl: 3   tirzepatide (MOUNJARO) 10 MG/0.5ML Pen, Inject 10 mg into the skin once a week., Disp: 3 mL, Rfl: 0   tirzepatide (MOUNJARO) 12.5  MG/0.5ML Pen, Inject 12.5 mg into the skin once a week., Disp: 6 mL, Rfl: 1   tirzepatide (MOUNJARO) 2.5 MG/0.5ML Pen, Inject 2.5 mg into the skin once a week., Disp: 3 mL, Rfl: 0   tirzepatide (MOUNJARO) 5 MG/0.5ML Pen, Inject 5 mg into the skin once a week., Disp: 3 mL, Rfl: 0   tirzepatide (MOUNJARO) 7.5 MG/0.5ML Pen, Inject 7.5 mg into the skin once a week., Disp: 3 mL, Rfl: 0   albuterol (VENTOLIN HFA) 108 (90 Base) MCG/ACT inhaler, Inhale 1-2 puffs into the lungs every 6 (six) hours as needed for wheezing or  shortness of breath. (Patient not taking: Reported on 04/17/2021), Disp: 18 g, Rfl: 2   Cholecalciferol 1.25 MG (50000 UT) capsule, Take 1 capsule (50,000 Units total) by mouth once a week. D/c after 6 months and take vitamin D3 4000 IU daily otc (Patient not taking: Reported on 04/17/2021), Disp: 13 capsule, Rfl: 1  EXAM:  VITALS per patient if applicable:  GENERAL: alert, oriented, appears well and in no acute distress  PSYCH/NEURO: pleasant and cooperative, no obvious depression or anxiety, speech and thought processing grossly intact  ASSESSMENT AND PLAN:  Discussed the following assessment and plan:  Frontal sinusitis, unspecified chronicity - Plan: azithromycin (ZITHROMAX) 250 MG tablet, predniSONE (DELTASONE) 20 MG tablet COVID-19 Otc meds  Declines covid 19 pill   Obesity (BMI 30-39.9) with DM2- Plan: tirzepatide (MOUNJARO) 2.5 MG/0.5ML Pen, tirzepatide (MOUNJARO) 5 MG/0.5ML Pen, tirzepatide (MOUNJARO) 7.5 MG/0.5ML Pen, tirzepatide (MOUNJARO) 10 MG/0.5ML Pen, tirzepatide (MOUNJARO) 12.5 MG/0.5ML Pen  Hepatic steatosis Liver mass Reschedule MRI with sedation at [place] PATIENT IS CLEARED TO HAVE mri WITH SEDATION HAS HAD PREVIOUSLY    -we discussed possible serious and likely etiologies, options for evaluation and workup, limitations of telemedicine visit vs in person visit, treatment, treatment risks and precautions. Pt is agreeable to treatment via telemedicine at this moment.    I discussed the assessment and treatment plan with the patient. The patient was provided an opportunity to ask questions and all were answered. The patient agreed with the plan and demonstrated an understanding of the instructions.    TIME 20 MINUTES  Delorise Jackson, MD

## 2021-04-17 NOTE — Progress Notes (Deleted)
Patient tested positive for Covid Saturday with a home test. Patient then went to [facility] and that was positive as well.  Patient having dry cough, headache, loss of taste/smell off and on, and nasal congestion.  Patient was having chills onset of Saturday so she did the home test.

## 2021-04-25 ENCOUNTER — Encounter: Payer: Self-pay | Admitting: Internal Medicine

## 2021-04-25 NOTE — Telephone Encounter (Signed)
Pt called in stating that her pharmacy sent over a request to get medication (tirzepatide [facility]) 2.5 MG/0.5ML Pen) approve. Pt stated that pharmacy advise her that they haven't heard anything back from provider office. Pt requesting callback

## 2021-05-01 ENCOUNTER — Other Ambulatory Visit: Payer: Self-pay

## 2021-05-01 ENCOUNTER — Encounter: Payer: Self-pay | Admitting: Internal Medicine

## 2021-05-01 ENCOUNTER — Encounter: Payer: Self-pay | Admitting: Pulmonary Disease

## 2021-05-01 ENCOUNTER — Ambulatory Visit: Payer: BC Managed Care – PPO | Admitting: Internal Medicine

## 2021-05-01 ENCOUNTER — Ambulatory Visit (INDEPENDENT_AMBULATORY_CARE_PROVIDER_SITE_OTHER): Payer: BC Managed Care – PPO | Admitting: Pulmonary Disease

## 2021-05-01 VITALS — BP 122/78 | HR 75 | Temp 98.7°F | Ht 65.0 in | Wt 193.4 lb

## 2021-05-01 VITALS — BP 126/80 | HR 72 | Temp 98.2°F | Ht 65.0 in | Wt 194.2 lb

## 2021-05-01 DIAGNOSIS — F17210 Nicotine dependence, cigarettes, uncomplicated: Secondary | ICD-10-CM

## 2021-05-01 DIAGNOSIS — Z8616 Personal history of COVID-19: Secondary | ICD-10-CM

## 2021-05-01 DIAGNOSIS — J449 Chronic obstructive pulmonary disease, unspecified: Secondary | ICD-10-CM

## 2021-05-01 DIAGNOSIS — K769 Liver disease, unspecified: Secondary | ICD-10-CM

## 2021-05-01 DIAGNOSIS — Z01818 Encounter for other preprocedural examination: Secondary | ICD-10-CM

## 2021-05-01 DIAGNOSIS — K760 Fatty (change of) liver, not elsewhere classified: Secondary | ICD-10-CM | POA: Insufficient documentation

## 2021-05-01 DIAGNOSIS — E119 Type 2 diabetes mellitus without complications: Secondary | ICD-10-CM

## 2021-05-01 DIAGNOSIS — E785 Hyperlipidemia, unspecified: Secondary | ICD-10-CM

## 2021-05-01 DIAGNOSIS — R160 Hepatomegaly, not elsewhere classified: Secondary | ICD-10-CM

## 2021-05-01 DIAGNOSIS — Z6832 Body mass index (BMI) 32.0-32.9, adult: Secondary | ICD-10-CM

## 2021-05-01 MED ORDER — TRELEGY ELLIPTA 100-62.5-25 MCG/ACT IN AEPB: 1.0000 | INHALATION_SPRAY | Freq: Every day | RESPIRATORY_TRACT | 6 refills | Status: DC

## 2021-05-01 NOTE — Patient Instructions (Signed)
We are going to get some breathing tests to better determine the function of your airways.  We are referring you to the lung cancer screening program.  We are giving you a trial of Trelegy Ellipta, 1 puff daily.  Make sure you rinse your mouth well after you use it.  It is highly recommended that you quit smoking.  We will see you in follow-up in 3 months time call sooner should any new problems arise.  Call you with the results of your testing if any abnormalities that require attention are noted.

## 2021-05-01 NOTE — Patient Instructions (Signed)
[facility] GI colonoscopy  Phone Fax E-mail Address  [phone] Not available [place] [address]

## 2021-05-01 NOTE — Progress Notes (Addendum)
Chief Complaint  Patient presents with   preop clearance   Pre-op screening for MRI liver needs sedation  Doing well  S/p covid doing well  Has tolerated MRI with sedation before claustrophobic    Review of Systems  Constitutional:  Negative for weight loss.  HENT:  Negative for hearing loss.   Eyes:  Negative for blurred vision.  Respiratory:  Negative for shortness of breath.   Cardiovascular:  Negative for chest pain.  Gastrointestinal:  Negative for abdominal pain and blood in stool.  Genitourinary:  Negative for dysuria.  Musculoskeletal:  Negative for falls and joint pain.  Skin:  Negative for rash.  Neurological:  Negative for headaches.  Psychiatric/Behavioral:  Negative for depression.   Past Medical History:  Diagnosis Date   Chicken pox    COVID-19    03/2020, 03/2021   Family history of adverse reaction to anesthesia 1996   mother did not wake up   GERD (gastroesophageal reflux disease)    History of kidney stones    passed 3 , litrotrispy   Hyperlipidemia    Liver lesion    hemangioma vs FNH vs adenoma noted MRI 2008 (Dr. Allen Norris)    Pneumonia    Pre-diabetes    Prediabetes    Trigger finger of left thumb    Past Surgical History:  Procedure Laterality Date   ABDOMINAL HYSTERECTOMY     2006/2007 for endometriosis    BACK SURGERY     L4/5 discectomy 09/18/2015 Dr. Lynann Bologna    breast reduction     [place] LITHOTRIPSY Right 06/07/2015   Procedure: EXTRACORPOREAL SHOCK WAVE LITHOTRIPSY (ESWL);  Surgeon: Royston Cowper, MD;  Location: [facility];  Service: Urology;  Laterality: Right;   RADIOLOGY WITH ANESTHESIA N/A 07/19/2015   Procedure: MRI LUMBER SPIN WITHOUT;  Surgeon: Medication Radiologist, MD;  Location: [place];  Service: Radiology;  Laterality: N/A;   RADIOLOGY WITH ANESTHESIA N/A 09/22/2017   Procedure: MRI LUMBAR SPINE WITHOUT CONTRAST;  Surgeon: Radiologist, Medication, MD;  Location: [place];  Service: Radiology;  Laterality: N/A;    RADIOLOGY WITH ANESTHESIA N/A 04/09/2021   Procedure: MRI LIVER WITH AND WITHOUT CONTRAST WITH ANESTHESIA;  Surgeon: Radiologist, Medication, MD;  Location: [place];  Service: Radiology;  Laterality: N/A;   SHOULDER OPEN ROTATOR CUFF REPAIR Bilateral 2007 2008   right 2007, left 2008    TRIGGER FINGER RELEASE Left 02/18/2016   Procedure: LEFT TRIGGER THUMB RELEASE;  Surgeon: Milly Jakob, MD;  Location: [place];  Service: Orthopedics;  Laterality: Left;   WISDOM TOOTH EXTRACTION     Family History  Problem Relation Age of Onset   Arthritis Mother    Early death Mother    Heart disease Mother        MVP with repair died from anestheisa    Hyperlipidemia Mother    Hypertension Mother    Kidney disease Mother    Pulmonary embolism Mother    Early death Father    Diabetes Father    Hyperlipidemia Father    Heart disease Father    Hypertension Father    Kidney disease Father    Mitral valve prolapse Father    Deep vein thrombosis Father    Pulmonary embolism Father        caused him to die   Diabetes Mellitus I Father    Kidney failure Father        s/p transplant   Diabetes Maternal Grandmother  Heart disease Maternal Grandmother    Heart disease Maternal Grandfather    Early death Paternal Grandmother    Diabetes Paternal Grandmother    Cancer Paternal Grandfather        prostate    Asthma Daughter    Depression Daughter    Bipolar disorder Daughter    Social History   Socioeconomic History   Marital status: Married    Spouse name: Not on file   Number of children: Not on file   Years of education: Not on file   Highest education level: Not on file  Occupational History   Not on file  Tobacco Use   Smoking status: Every Day    Packs/day: 1.00    Years: 30.00    Pack years: 30.00    Types: Cigarettes   Smokeless tobacco: Never   Tobacco comments:    0.75PPD 05/01/2021  Vaping Use   Vaping Use: Never used  Substance and Sexual Activity    Alcohol use: No   Drug use: No   Sexual activity: Yes  Other Topics Concern   Not on file  Social History Narrative   Married    2 kids son going to army   Daughter going to [employer] fall 2019    Owns guns    Wears seat belt    Safe in relationship    Social Determinants of Radio broadcast assistant Strain: Not on file  Food Insecurity: Not on file  Transportation Needs: Not on file  Physical Activity: Not on file  Stress: Not on file  Social Connections: Not on file  Intimate Partner Violence: Not on file   Current Meds  Medication Sig   albuterol (VENTOLIN HFA) 108 (90 Base) MCG/ACT inhaler Inhale 1-2 puffs into the lungs every 6 (six) hours as needed for wheezing or shortness of breath.   Cholecalciferol 1.25 MG (50000 UT) capsule Take 1 capsule (50,000 Units total) by mouth once a week. D/c after 6 months and take vitamin D3 4000 IU daily otc   simvastatin (ZOCOR) 10 MG tablet Take 1 tablet (10 mg total) by mouth at bedtime.   No Known Allergies Recent Results (from the past 2160 hour(s))  Microalbumin / creatinine urine ratio     Status: None   Collection Time: 03/06/21  9:23 AM  Result Value Ref Range   Creatinine, Urine 187 20 - 275 mg/dL   Microalb, Ur 0.9 mg/dL    Comment: Reference Range Not established    Microalb Creat Ratio 5 <30 mcg/mg creat    Comment: . The ADA defines abnormalities in albumin excretion as follows: Marland Kitchen Albuminuria Category        Result (mcg/mg creatinine) . Normal to Mildly increased   <30 Moderately increased         30-299  Severely increased           > OR = 300 . The ADA recommends that at least two of three specimens collected within a 3-6 month period be abnormal before considering a patient to be within a diagnostic category.   D-Dimer, Quantitative     Status: None   Collection Time: 03/06/21  9:23 AM  Result Value Ref Range   D-Dimer, Quant 0.48 <0.50 mcg/mL FEU    Comment: . The D-Dimer test is used frequently to  exclude an acute PE or DVT. In patients with a low to moderate clinical risk assessment and a D-Dimer result <0.50 mcg/mL FEU, the likelihood of a PE  or DVT is very low. However, a thromboembolic event should not be excluded solely on the basis of the D-Dimer level. Increased levels of D-Dimer are associated with a PE, DVT, DIC, malignancies, inflammation, sepsis, surgery, trauma, pregnancy, and advancing patient age. [Jama 2006 11:295(2):199-207] . For additional information, please refer to: http://education.questdiagnostics.com/faq/FAQ149 (This link is being provided for informational/ educational purposes only) .   Hemoglobin A1c     Status: Abnormal   Collection Time: 03/06/21  9:24 AM  Result Value Ref Range   Hgb A1c MFr Bld 6.7 (H) 4.6 - 6.5 %    Comment: Glycemic Control Guidelines for People with Diabetes:Non Diabetic:  <6%Goal of Therapy: <7%Additional Action Suggested:  >8%   Comprehensive metabolic panel     Status: None   Collection Time: 03/06/21  9:24 AM  Result Value Ref Range   Sodium 140 135 - 145 mEq/L   Potassium 4.3 3.5 - 5.1 mEq/L   Chloride 106 96 - 112 mEq/L   CO2 29 19 - 32 mEq/L   Glucose, Bld 99 70 - 99 mg/dL   BUN 15 6 - 23 mg/dL   Creatinine, Ser 0.73 0.40 - 1.20 mg/dL   Total Bilirubin 0.4 0.2 - 1.2 mg/dL   Alkaline Phosphatase 86 39 - 117 U/L   AST 14 0 - 37 U/L   ALT 19 0 - 35 U/L   Total Protein 7.2 6.0 - 8.3 g/dL   Albumin 4.1 3.5 - 5.2 g/dL   GFR 94.81 >60.00 mL/min    Comment: Calculated using the CKD-EPI Creatinine Equation (2021)   Calcium 9.6 8.4 - 10.5 mg/dL  CBC with Differential/Platelet     Status: Abnormal   Collection Time: 03/06/21  9:24 AM  Result Value Ref Range   WBC 5.0 4.0 - 10.5 K/uL   RBC 5.25 (H) 3.87 - 5.11 Mil/uL   Hemoglobin 15.5 (H) 12.0 - 15.0 g/dL   HCT 47.3 (H) 36.0 - 46.0 %   MCV 90.0 78.0 - 100.0 fl   MCHC 32.9 30.0 - 36.0 g/dL   RDW 14.2 11.5 - 15.5 %   Platelets 301.0 150.0 - 400.0 K/uL    Neutrophils Relative % 42.2 (L) 43.0 - 77.0 %   Lymphocytes Relative 43.2 12.0 - 46.0 %   Monocytes Relative 13.1 (H) 3.0 - 12.0 %   Eosinophils Relative 1.0 0.0 - 5.0 %   Basophils Relative 0.5 0.0 - 3.0 %   Neutro Abs 2.1 1.4 - 7.7 K/uL   Lymphs Abs 2.2 0.7 - 4.0 K/uL   Monocytes Absolute 0.7 0.1 - 1.0 K/uL   Eosinophils Absolute 0.1 0.0 - 0.7 K/uL   Basophils Absolute 0.0 0.0 - 0.1 K/uL  Lipid panel     Status: Abnormal   Collection Time: 03/06/21  9:24 AM  Result Value Ref Range   Cholesterol 207 (H) 0 - 200 mg/dL    Comment: ATP III Classification       Desirable:  < 200 mg/dL               Borderline High:  200 - 239 mg/dL          High:  > = 240 mg/dL   Triglycerides 112.0 0.0 - 149.0 mg/dL    Comment: Normal:  <150 mg/dLBorderline High:  150 - 199 mg/dL   HDL 40.90 >39.00 mg/dL   VLDL 22.4 0.0 - 40.0 mg/dL   LDL Cholesterol 143 (H) 0 - 99 mg/dL   Total CHOL/HDL Ratio 5  Comment:                Men          Women1/2 Average Risk     3.4          3.3Average Risk          5.0          4.42X Average Risk          9.6          7.13X Average Risk          15.0          11.0                       NonHDL 165.66     Comment: NOTE:  Non-HDL goal should be 30 mg/dL higher than patient's LDL goal (i.e. LDL goal of < 70 mg/dL, would have non-HDL goal of < 100 mg/dL)  TSH     Status: None   Collection Time: 03/06/21  9:24 AM  Result Value Ref Range   TSH 1.02 0.35 - 5.50 uIU/mL  VITAMIN D 25 Hydroxy (Vit-D Deficiency, Fractures)     Status: Abnormal   Collection Time: 03/06/21  9:24 AM  Result Value Ref Range   VITD 12.93 (L) 30.00 - 100.00 ng/mL  Urinalysis, Routine w reflex microscopic     Status: None   Collection Time: 03/06/21 10:04 AM  Result Value Ref Range   Color, Urine YELLOW YELLOW   APPearance CLEAR CLEAR   Specific Gravity, Urine 1.025 1.001 - 1.035   pH 6.5 5.0 - 8.0   Glucose, UA NEGATIVE NEGATIVE   Bilirubin Urine NEGATIVE NEGATIVE   Ketones, ur NEGATIVE NEGATIVE    Hgb urine dipstick NEGATIVE NEGATIVE   Protein, ur NEGATIVE NEGATIVE   Nitrite NEGATIVE NEGATIVE   Leukocytes,Ua NEGATIVE NEGATIVE  Glucose, capillary     Status: Abnormal   Collection Time: 04/09/21  7:00 AM  Result Value Ref Range   Glucose-Capillary 133 (H) 70 - 99 mg/dL    Comment: Glucose reference range applies only to samples taken after fasting for at least 8 hours.   Objective  Body mass index is 32.18 kg/m. Wt Readings from Last 3 Encounters:  05/01/21 193 lb 6.4 oz (87.7 kg)  05/01/21 194 lb 3.2 oz (88.1 kg)  04/17/21 192 lb (87.1 kg)   Temp Readings from Last 3 Encounters:  05/01/21 98.7 F (37.1 C) (Oral)  05/01/21 98.2 F (36.8 C) (Temporal)  04/09/21 97.7 F (36.5 C) (Oral)   BP Readings from Last 3 Encounters:  05/01/21 122/78  05/01/21 126/80  04/09/21 112/68   Pulse Readings from Last 3 Encounters:  05/01/21 75  05/01/21 72  04/09/21 65    Physical Exam Vitals and nursing note reviewed.  Constitutional:      Appearance: Normal appearance. She is well-developed and well-groomed.  HENT:     Head: Normocephalic and atraumatic.  Eyes:     Conjunctiva/sclera: Conjunctivae normal.     Pupils: Pupils are equal, round, and reactive to light.  Cardiovascular:     Rate and Rhythm: Normal rate and regular rhythm.     Heart sounds: Normal heart sounds. No murmur heard. Pulmonary:     Effort: Pulmonary effort is normal.     Breath sounds: Normal breath sounds.  Abdominal:     General: Abdomen is flat. Bowel sounds are normal.     Tenderness: There is no abdominal  tenderness.  Musculoskeletal:        General: No tenderness.  Skin:    General: Skin is warm and dry.  Neurological:     General: No focal deficit present.     Mental Status: She is alert and oriented to person, place, and time. Mental status is at baseline.     Cranial Nerves: Cranial nerves 2-12 are intact.     Motor: Motor function is intact.     Coordination: Coordination is intact.      Gait: Gait is intact.  Psychiatric:        Attention and Perception: Attention and perception normal.        Mood and Affect: Mood and affect normal.        Speech: Speech normal.        Behavior: Behavior normal. Behavior is cooperative.        Thought Content: Thought content normal.        Cognition and Memory: Cognition and memory normal.        Judgment: Judgment normal.    Assessment  Plan  Preop exam for MRI ab/liver with and w/o contrast with sedation  Pt is low risk for sedation please approve  Liver lesion Liver mass, right lobe Hepatic steatosis   HM Never flu shot  Tdap declines, pna shots declines 03/06/21  3/3 moderna declines 4th dose MMR immune and hep B immune  Ordered hep C lab neg 12/23/19    mammo [facility]   Get copy prior pap [facility] s/p hysterectomy in 2006/2007 for endometriosis she reports h/o abnormal pap had total hysterectomy  -consider pap in future at f/u h/o abnormal pap [facility]    rec smoking cessation smoker since age 1 y.o max 1 ppd now < 1ppd    Colonoscopy referred Dr. Alice Reichert GI tbd date of colonoscopy   Eye [place] 02/21/21 eye exam no mention of dm changes in eye but not sure if screened per the notes    Dentist Dr. Bronson Curb   Provider: Dr. Olivia Mackie McLean-Scocuzza-Internal Medicine

## 2021-05-01 NOTE — Progress Notes (Signed)
Subjective:    Patient ID: Lori Lang, female    DOB: 1970-11-28, 51 y.o.   MRN: [id number] Chief Complaint  Patient presents with   pulmonary consult    Hx of Bronchitis- no current sx. Recent back pain with deep breathing--sx have now subsided.     HPI Patient is a 51 year old current smoker with a 30-pack-year history of smoking and a history as noted below, who presents for evaluation of persistent pleuritic pain and "bronchitis".  She is kindly referred by Dr. Orland Mustard.  The patient had video visit with Dr. Aundra Dubin on 17 April 2021 and at that time was noted to be positive for COVID-19 had had issues with chills sinus pressure cough and shortness of breath.  Developed some back pain and some pleuritic chest pain associated with the symptoms.  She was treated with azithromycin and a prednisone taper as she declined antivirals.  She states that since that visit her symptoms have actually subsided.  She has some mild dyspnea on exertion that comes "off-and-on" mostly on steps and inclines.  This has been going on for several years.  No chest pain she completed her prednisone on 28 January.  Does note occasional wheezing.  She has not had any hemoptysis.  Productive cough has improved since treatment with the Azithromycin and prednisone.  She has no occupational exposure.  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  No military experience.  Lives with her son, son and daughter.   Review of Systems A 10 point review of systems was performed and it is as noted above otherwise negative.  Past Medical History:  Diagnosis Date   Chicken pox    COVID-19    03/2020, 03/2021   Family history of adverse reaction to anesthesia 1996   mother did not wake up   GERD (gastroesophageal reflux disease)    History of kidney stones    passed 3 , litrotrispy   Hyperlipidemia    Liver lesion    hemangioma vs FNH vs adenoma noted MRI 2008 (Dr. Allen Norris)    Pneumonia    Pre-diabetes    Prediabetes     Trigger finger of left thumb    Past Surgical History:  Procedure Laterality Date   ABDOMINAL HYSTERECTOMY     2006/2007 for endometriosis    BACK SURGERY     L4/5 discectomy 09/18/2015 Dr. Lynann Bologna    breast reduction     [place] LITHOTRIPSY Right 06/07/2015   Procedure: EXTRACORPOREAL SHOCK WAVE LITHOTRIPSY (ESWL);  Surgeon: Royston Cowper, MD;  Location: [facility];  Service: Urology;  Laterality: Right;   RADIOLOGY WITH ANESTHESIA N/A 07/19/2015   Procedure: MRI LUMBER SPIN WITHOUT;  Surgeon: Medication Radiologist, MD;  Location: [place];  Service: Radiology;  Laterality: N/A;   RADIOLOGY WITH ANESTHESIA N/A 09/22/2017   Procedure: MRI LUMBAR SPINE WITHOUT CONTRAST;  Surgeon: Radiologist, Medication, MD;  Location: [place];  Service: Radiology;  Laterality: N/A;   RADIOLOGY WITH ANESTHESIA N/A 04/09/2021   Procedure: MRI LIVER WITH AND WITHOUT CONTRAST WITH ANESTHESIA;  Surgeon: Radiologist, Medication, MD;  Location: [place];  Service: Radiology;  Laterality: N/A;   SHOULDER OPEN ROTATOR CUFF REPAIR Bilateral 2007 2008   right 2007, left 2008    TRIGGER FINGER RELEASE Left 02/18/2016   Procedure: LEFT TRIGGER THUMB RELEASE;  Surgeon: Milly Jakob, MD;  Location: [place];  Service: Orthopedics;  Laterality: Left;   WISDOM TOOTH EXTRACTION  Patient Active Problem List   Diagnosis Date Noted   Obesity (BMI 30-39.9) 04/17/2021   COVID-19 04/17/2021   Frontal sinusitis 04/17/2021   Liver hemangioma 03/14/2021   Liver lesion 03/14/2021   Fatty liver 03/14/2021   Aortic atherosclerosis ([place]) 12/26/2019   Overweight (BMI 25.0-29.9) 12/22/2019   Lumbar radiculopathy 05/25/2019   Sciatica (Left) 07/14/2018   Chronic pain syndrome 07/14/2018   Pharmacologic therapy 07/14/2018   Disorder of skeletal system 07/14/2018   Problems influencing health status 07/14/2018   Failed back surgical syndrome 07/14/2018   Chronic low back pain (Secondary Area  of Pain) (Left) w/ sciatica (Left) 07/14/2018   Chronic lower extremity pain (Primary area of Pain) (Left) 07/14/2018   Lumbar facet arthropathy 07/14/2018   DDD (degenerative disc disease), lumbosacral 07/14/2018   Leg cramps (Bilateral) 07/14/2018   Displacement of lumbar intervertebral disc (L5-S1) w/ radiculopathy (S1) (Left) 07/14/2018   Lumbar spondylosis 07/14/2018   Chronic musculoskeletal pain 07/14/2018   Neurogenic pain 07/14/2018   Abnormal MRI, lumbar spine 07/14/2018   Nocturnal leg cramps (Bilateral) 06/22/2018   Vitamin D deficiency 08/31/2017   HLD (hyperlipidemia) 08/31/2017   DM2 (diabetes mellitus, type 2) ([place]) 08/31/2017   Tobacco abuse 08/31/2017   Polycythemia 08/31/2017   Hot flashes 08/25/2017   Lumbosacral radiculopathy (S1) (Left) 08/25/2017   Family History  Problem Relation Age of Onset   Arthritis Mother    Early death Mother    Heart disease Mother        MVP with repair died from anestheisa    Hyperlipidemia Mother    Hypertension Mother    Kidney disease Mother    Pulmonary embolism Mother    Early death Father    Diabetes Father    Hyperlipidemia Father    Heart disease Father    Hypertension Father    Kidney disease Father    Mitral valve prolapse Father    Deep vein thrombosis Father    Pulmonary embolism Father        caused him to die   Diabetes Mellitus I Father    Kidney failure Father        s/p transplant   Diabetes Maternal Grandmother    Heart disease Maternal Grandmother    Heart disease Maternal Grandfather    Early death Paternal Grandmother    Diabetes Paternal Grandmother    Cancer Paternal Grandfather        prostate    Asthma Daughter    Depression Daughter    Bipolar disorder Daughter    Social History   Tobacco Use   Smoking status: Every Day    Packs/day: 1.00    Years: 30.00    Pack years: 30.00    Types: Cigarettes   Smokeless tobacco: Never   Tobacco comments:    0.75PPD 05/01/2021  Substance Use  Topics   Alcohol use: No   No Known Allergies  Current Meds  Medication Sig   albuterol (VENTOLIN HFA) 108 (90 Base) MCG/ACT inhaler Inhale 1-2 puffs into the lungs every 6 (six) hours as needed for wheezing or shortness of breath.   Cholecalciferol 1.25 MG (50000 UT) capsule Take 1 capsule (50,000 Units total) by mouth once a week. D/c after 6 months and take vitamin D3 4000 IU daily otc   simvastatin (ZOCOR) 10 MG tablet Take 1 tablet (10 mg total) by mouth at bedtime.   tirzepatide (MOUNJARO) 10 MG/0.5ML Pen Inject 10 mg into the skin once a week.   tirzepatide [facility])  12.5 MG/0.5ML Pen Inject 12.5 mg into the skin once a week.   tirzepatide [facility]) 2.5 MG/0.5ML Pen Inject 2.5 mg into the skin once a week.   tirzepatide [facility]) 5 MG/0.5ML Pen Inject 5 mg into the skin once a week.   tirzepatide (MOUNJARO) 7.5 MG/0.5ML Pen Inject 7.5 mg into the skin once a week.   Immunization History  Administered Date(s) Administered   Medtronic Booster Vaccination 06/22/2020   Moderna Sars-Covid-2 Vaccination 08/10/2019, 09/07/2019       Objective:   Physical Exam BP 126/80 (BP Location: Left Arm, Cuff Size: Normal)   Pulse 72   Temp 98.2 F (36.8 C) (Temporal)   Ht 5\' 5"  (1.651 m)   Wt 194 lb 3.2 oz (88.1 kg)   SpO2 96%   BMI 32.32 kg/m  GENERAL: Well-developed, obese woman, no acute distress.  Fully ambulatory, no conversational dyspnea. HEAD: Normocephalic, atraumatic.  EYES: Pupils equal, round, reactive to light.  No scleral icterus.  MOUTH: Nose/mouth/throat not examined due to COVID-19 masking requirements. NECK: Supple. No thyromegaly. Trachea midline. No JVD.  No adenopathy. PULMONARY: Good air entry bilaterally.  Scattered rhonchi and wheezes noted. CARDIOVASCULAR: S1 and S2. Regular rate and rhythm.  No rubs, murmurs or gallops heard. ABDOMEN: Benign. MUSCULOSKELETAL: No joint deformity, no clubbing, no edema.  NEUROLOGIC: No overt focal deficit, no gait  disturbance, speech is fluent. SKIN: Intact,warm,dry. PSYCH: Mood and behavior normal.       Assessment & Plan:     ICD-10-CM   1. COPD suggested by initial evaluation [facility])  J44.9 Pulmonary Function Test [facility] Only   Will obtain pulmonary function testing Mild bronchospasm noted on exam Trial of Trelegy, samples provided    2. Tobacco dependence due to cigarettes  F17.210 Ambulatory Referral for Lung Cancer Scre   Patient counseled regards discontinuation of smoking Referred to lung cancer screening program    3. Personal history of COVID-19  Z86.16    Symptoms have for the most part resolved     Orders Placed This Encounter  Procedures   Ambulatory Referral for Lung Cancer Scre    Referral Priority:   Routine    Referral Type:   Consultation    Referral Reason:   Specialty Services Required    Number of Visits Requested:   1   Pulmonary Function Test [facility] Only    Standing Status:   Future    Standing Expiration Date:   05/01/2022    Scheduling Instructions:     NEXT AVAILABLE.    Order Specific Question:   Full PFT: includes the following: basic spirometry, spirometry pre & post bronchodilator, diffusion capacity (DLCO), lung volumes    Answer:   Full PFT   Meds ordered this encounter  Medications   Fluticasone-Umeclidin-Vilant (TRELEGY ELLIPTA) 100-62.5-25 MCG/ACT AEPB    Sig: Inhale 1 puff into the lungs daily.    Dispense:  28 each    Refill:  6   C. Derrill Kay, MD Advanced Bronchoscopy [facility]-    *This note was dictated using voice recognition software/Dragon.  Despite best efforts to proofread, errors can occur which can change the meaning. Any transcriptional errors that result from this process are unintentional and may not be fully corrected at the time of dictation.

## 2021-05-21 ENCOUNTER — Other Ambulatory Visit: Payer: Self-pay | Admitting: Internal Medicine

## 2021-05-21 DIAGNOSIS — E785 Hyperlipidemia, unspecified: Secondary | ICD-10-CM

## 2021-05-21 DIAGNOSIS — E119 Type 2 diabetes mellitus without complications: Secondary | ICD-10-CM

## 2021-05-21 DIAGNOSIS — E669 Obesity, unspecified: Secondary | ICD-10-CM

## 2021-05-21 MED ORDER — OZEMPIC (0.25 OR 0.5 MG/DOSE) 2 MG/1.5ML ~~LOC~~ SOPN: 0.2500 mg | PEN_INJECTOR | SUBCUTANEOUS | 2 refills | Status: DC

## 2021-05-23 ENCOUNTER — Ambulatory Visit (HOSPITAL_COMMUNITY): Payer: BC Managed Care – PPO

## 2021-05-28 ENCOUNTER — Ambulatory Visit (HOSPITAL_COMMUNITY): Payer: BC Managed Care – PPO

## 2021-05-31 ENCOUNTER — Telehealth: Payer: Self-pay | Admitting: Internal Medicine

## 2021-05-31 NOTE — Progress Notes (Signed)
Working up a chart for an MRI with anesthesia. The last H&P was done 05/29/21. This is greater than 30 days. I have sent a secure chat to Dr. Terese Door to see if pt can be seen before Tuesday. ?

## 2021-05-31 NOTE — Telephone Encounter (Signed)
Spoke with Lori Lang in preop re pt mri she had appt 05/01/21 for pre op clearance for anesthesia for MRI liver this was scheduled 05/23/21 and canceled by pt and rescheduled 05/28/21 by pt and rescheduled 06/04/21 but pt was aware it needed to be done and scheduled w/in 30 days of her visit with me or else she would need another visit w/in 30 days to comply with anesthesia's requests  ? ?She was aware of this from the first instance MRI was scheduled and no recent office visit w/in 30 days and needed f/u for H&P 30 days w/in MRI scheduling our office has done our part as far as notes faxed and notes complete to schedule but see above pt cancelled her MRIs  2x w/in 3 days window 05/23/21 and 05/28/21 and rescheduled to 06/04/21  ? ?We called the pt to work her in for another HPI she was frustrated and upset and declined to schedule this before her 06/04/21 MRI  ? ? ?Dr. Olivia Mackie McLean-Scocuzza ? ? ?

## 2021-06-04 ENCOUNTER — Ambulatory Visit: Admit: 2021-06-04 | Payer: BC Managed Care – PPO

## 2021-06-04 ENCOUNTER — Ambulatory Visit (HOSPITAL_COMMUNITY): Payer: BC Managed Care – PPO

## 2021-06-04 SURGERY — MRI WITH ANESTHESIA
Anesthesia: General

## 2021-06-04 NOTE — Telephone Encounter (Signed)
Noted  

## 2021-07-31 ENCOUNTER — Ambulatory Visit: Payer: BC Managed Care – PPO | Admitting: Pulmonary Disease

## 2021-09-27 ENCOUNTER — Encounter: Payer: Self-pay | Admitting: Internal Medicine

## 2021-09-27 DIAGNOSIS — E119 Type 2 diabetes mellitus without complications: Secondary | ICD-10-CM | POA: Insufficient documentation

## 2021-09-27 DIAGNOSIS — E785 Hyperlipidemia, unspecified: Secondary | ICD-10-CM | POA: Insufficient documentation

## 2021-09-27 DIAGNOSIS — Z6832 Body mass index (BMI) 32.0-32.9, adult: Secondary | ICD-10-CM | POA: Insufficient documentation

## 2021-09-27 MED ORDER — OZEMPIC (0.25 OR 0.5 MG/DOSE) 2 MG/1.5ML ~~LOC~~ SOPN: 0.2500 mg | PEN_INJECTOR | SUBCUTANEOUS | 2 refills | Status: DC

## 2021-09-27 NOTE — Addendum Note (Signed)
Addended by: Orland Mustard on: 09/27/2021 05:03 PM   Modules accepted: Orders

## 2021-09-30 ENCOUNTER — Telehealth: Payer: Self-pay

## 2021-09-30 NOTE — Telephone Encounter (Signed)
PA has been started on 09/30/21 via [email or web address]  Prior Auth (EOC) [id number] Drug/Service Name:OZEMPIC 0.25-0.5 MG/DOSE PEN Patient:Lori Lang Date Requested:09/30/2021 9:22:00 AM   MemberID:[id number] DOB:[birth year]  Awaiting Denial or Approval

## 2021-11-20 ENCOUNTER — Ambulatory Visit: Payer: Commercial Managed Care - PPO | Admitting: Internal Medicine

## 2021-11-20 ENCOUNTER — Encounter: Payer: Self-pay | Admitting: Internal Medicine

## 2021-11-20 ENCOUNTER — Ambulatory Visit (INDEPENDENT_AMBULATORY_CARE_PROVIDER_SITE_OTHER): Payer: Commercial Managed Care - PPO

## 2021-11-20 VITALS — BP 126/80 | HR 70 | Temp 98.3°F | Ht 65.0 in | Wt 177.4 lb

## 2021-11-20 DIAGNOSIS — G8929 Other chronic pain: Secondary | ICD-10-CM

## 2021-11-20 DIAGNOSIS — M25511 Pain in right shoulder: Secondary | ICD-10-CM | POA: Diagnosis not present

## 2021-11-20 DIAGNOSIS — E119 Type 2 diabetes mellitus without complications: Secondary | ICD-10-CM

## 2021-11-20 DIAGNOSIS — E559 Vitamin D deficiency, unspecified: Secondary | ICD-10-CM | POA: Diagnosis not present

## 2021-11-20 DIAGNOSIS — E785 Hyperlipidemia, unspecified: Secondary | ICD-10-CM | POA: Diagnosis not present

## 2021-11-20 DIAGNOSIS — K769 Liver disease, unspecified: Secondary | ICD-10-CM | POA: Diagnosis not present

## 2021-11-20 DIAGNOSIS — M898X1 Other specified disorders of bone, shoulder: Secondary | ICD-10-CM

## 2021-11-20 DIAGNOSIS — Z6832 Body mass index (BMI) 32.0-32.9, adult: Secondary | ICD-10-CM

## 2021-11-20 DIAGNOSIS — R160 Hepatomegaly, not elsewhere classified: Secondary | ICD-10-CM

## 2021-11-20 LAB — COMPREHENSIVE METABOLIC PANEL
ALT: 16 U/L (ref 0–35)
AST: 15 U/L (ref 0–37)
Albumin: 4.2 g/dL (ref 3.5–5.2)
Alkaline Phosphatase: 85 U/L (ref 39–117)
BUN: 15 mg/dL (ref 6–23)
CO2: 25 mEq/L (ref 19–32)
Calcium: 9.5 mg/dL (ref 8.4–10.5)
Chloride: 105 mEq/L (ref 96–112)
Creatinine, Ser: 0.71 mg/dL (ref 0.40–1.20)
GFR: 97.54 mL/min (ref >60.00)
Glucose, Bld: 70 mg/dL (ref 70–99)
Potassium: 3.9 mEq/L (ref 3.5–5.1)
Sodium: 140 mEq/L (ref 135–145)
Total Bilirubin: 0.6 mg/dL (ref 0.2–1.2)
Total Protein: 7.2 g/dL (ref 6.0–8.3)

## 2021-11-20 LAB — LIPID PANEL
Cholesterol: 205 mg/dL — ABNORMAL HIGH (ref 0–200)
HDL: 38.5 mg/dL — ABNORMAL LOW (ref >39.00)
LDL Cholesterol: 141 mg/dL — ABNORMAL HIGH (ref 0–99)
NonHDL: 166.34
Total CHOL/HDL Ratio: 5
Triglycerides: 125 mg/dL (ref 0.0–149.0)
VLDL: 25 mg/dL (ref 0.0–40.0)

## 2021-11-20 LAB — CBC WITH DIFFERENTIAL/PLATELET
Basophils Absolute: 0 10*3/uL (ref 0.0–0.1)
Basophils Relative: 0.9 % (ref 0.0–3.0)
Eosinophils Absolute: 0 10*3/uL (ref 0.0–0.7)
Eosinophils Relative: 1.1 % (ref 0.0–5.0)
HCT: 49.5 % — ABNORMAL HIGH (ref 36.0–46.0)
Hemoglobin: 16.6 g/dL — ABNORMAL HIGH (ref 12.0–15.0)
Lymphocytes Relative: 41.9 % (ref 12.0–46.0)
Lymphs Abs: 1.8 10*3/uL (ref 0.7–4.0)
MCHC: 33.6 g/dL (ref 30.0–36.0)
MCV: 89.9 fl (ref 78.0–100.0)
Monocytes Absolute: 0.4 10*3/uL (ref 0.1–1.0)
Monocytes Relative: 10.2 % (ref 3.0–12.0)
Neutro Abs: 1.9 10*3/uL (ref 1.4–7.7)
Neutrophils Relative %: 45.9 % (ref 43.0–77.0)
Platelets: 243 10*3/uL (ref 150.0–400.0)
RBC: 5.5 Mil/uL — ABNORMAL HIGH (ref 3.87–5.11)
RDW: 14.8 % (ref 11.5–15.5)
WBC: 4.2 10*3/uL (ref 4.0–10.5)

## 2021-11-20 LAB — VITAMIN D 25 HYDROXY (VIT D DEFICIENCY, FRACTURES): VITD: 14.34 ng/mL — ABNORMAL LOW (ref 30.00–100.00)

## 2021-11-20 LAB — HEMOGLOBIN A1C: Hgb A1c MFr Bld: 6.3 % (ref 4.6–6.5)

## 2021-11-20 MED ORDER — CHOLECALCIFEROL 1.25 MG (50000 UT) PO CAPS: 50000.0000 [IU] | ORAL_CAPSULE | ORAL | 1 refills | Status: DC

## 2021-11-20 MED ORDER — SEMAGLUTIDE (1 MG/DOSE) 4 MG/3ML ~~LOC~~ SOPN: 1.0000 mg | PEN_INJECTOR | SUBCUTANEOUS | 5 refills | Status: DC

## 2021-11-20 NOTE — Addendum Note (Signed)
Addended by: Orland Mustard on: 11/20/2021 12:40 PM   Modules accepted: Orders

## 2021-11-20 NOTE — Addendum Note (Signed)
Addended by: Orland Mustard on: 11/20/2021 05:38 PM   Modules accepted: Orders

## 2021-11-20 NOTE — Progress Notes (Signed)
Chief Complaint  Patient presents with   Shoulder Pain    Pt has right should blade pain off and on for about 3-4 months. Its not bothering her right now.   F/u  1. Right scapular pain x months s/p b/l shoulder replacement on and x 3-4 months est with with ortho in [place] years ago Dr. Lita Mains 2. Dm 2 6.7 on ozempic 0.5 weekly doing well wants to increase check labs today  3. Liver mass needs MRI wants to do it [facility] with fentanyl and versed due to claustrophobia  [place] 02/2021  IMPRESSION: 1.  No acute hepatobiliary findings.   2. Findings suggesting a degree of hepatic steatosis. 4.6 cm masslike hypoechoic abnormality over the right lobe of the liver. Recommend further evaluation via MRI on an elective basis.     Electronically Signed   By: Marin Olp M.D.   On: 03/13/2021 15:59   Review of Systems  Constitutional:  Negative for weight loss.  HENT:  Negative for hearing loss.   Eyes:  Negative for blurred vision.  Respiratory:  Negative for shortness of breath.   Cardiovascular:  Negative for chest pain.  Gastrointestinal:  Negative for abdominal pain and blood in stool.  Genitourinary:  Negative for dysuria.  Musculoskeletal:  Positive for joint pain. Negative for falls.  Skin:  Negative for rash.  Neurological:  Negative for headaches.  Psychiatric/Behavioral:  Negative for depression.    Past Medical History:  Diagnosis Date   Chicken pox    COVID-19    03/2020, 03/2021   Family history of adverse reaction to anesthesia 1996   mother did not wake up   GERD (gastroesophageal reflux disease)    History of kidney stones    passed 3 , litrotrispy   Hyperlipidemia    Liver lesion    hemangioma vs FNH vs adenoma noted MRI 2008 (Dr. Allen Norris)    Pneumonia    Pre-diabetes    Prediabetes    Trigger finger of left thumb    Past Surgical History:  Procedure Laterality Date   ABDOMINAL HYSTERECTOMY     2006/2007 for endometriosis    BACK SURGERY     L4/5 discectomy  09/18/2015 Dr. Lynann Bologna    breast reduction     [place] LITHOTRIPSY Right 06/07/2015   Procedure: EXTRACORPOREAL SHOCK WAVE LITHOTRIPSY (ESWL);  Surgeon: Royston Cowper, MD;  Location: [facility];  Service: Urology;  Laterality: Right;   RADIOLOGY WITH ANESTHESIA N/A 07/19/2015   Procedure: MRI LUMBER SPIN WITHOUT;  Surgeon: Medication Radiologist, MD;  Location: [place];  Service: Radiology;  Laterality: N/A;   RADIOLOGY WITH ANESTHESIA N/A 09/22/2017   Procedure: MRI LUMBAR SPINE WITHOUT CONTRAST;  Surgeon: Radiologist, Medication, MD;  Location: [facility];  Service: Radiology;  Laterality: N/A;   RADIOLOGY WITH ANESTHESIA N/A 04/09/2021   Procedure: MRI LIVER WITH AND WITHOUT CONTRAST WITH ANESTHESIA;  Surgeon: Radiologist, Medication, MD;  Location: [place];  Service: Radiology;  Laterality: N/A;   SHOULDER OPEN ROTATOR CUFF REPAIR Bilateral 2007 2008   right 2007, left 2008    TRIGGER FINGER RELEASE Left 02/18/2016   Procedure: LEFT TRIGGER THUMB RELEASE;  Surgeon: Milly Jakob, MD;  Location: [facility];  Service: Orthopedics;  Laterality: Left;   WISDOM TOOTH EXTRACTION     Family History  Problem Relation Age of Onset   Arthritis Mother    Early death Mother    Heart disease Mother  MVP with repair died from anestheisa    Hyperlipidemia Mother    Hypertension Mother    Kidney disease Mother    Pulmonary embolism Mother    Early death Father    Diabetes Father    Hyperlipidemia Father    Heart disease Father    Hypertension Father    Kidney disease Father    Mitral valve prolapse Father    Deep vein thrombosis Father    Pulmonary embolism Father        caused him to die   Diabetes Mellitus I Father    Kidney failure Father        s/p transplant   Diabetes Maternal Grandmother    Heart disease Maternal Grandmother    Heart disease Maternal Grandfather    Early death Paternal Grandmother    Diabetes Paternal Grandmother    Cancer  Paternal Grandfather        prostate    Asthma Daughter    Depression Daughter    Bipolar disorder Daughter    Social History   Socioeconomic History   Marital status: Married    Spouse name: Not on file   Number of children: Not on file   Years of education: Not on file   Highest education level: Not on file  Occupational History   Not on file  Tobacco Use   Smoking status: Every Day    Packs/day: 1.00    Years: 30.00    Total pack years: 30.00    Types: Cigarettes   Smokeless tobacco: Never   Tobacco comments:    0.75PPD 05/01/2021  Vaping Use   Vaping Use: Never used  Substance and Sexual Activity   Alcohol use: No   Drug use: No   Sexual activity: Yes  Other Topics Concern   Not on file  Social History Narrative   Married    2 kids son going to army   Daughter going to [employer] fall 2019    Owns guns    Wears seat belt    Safe in relationship    Social Determinants of Radio broadcast assistant Strain: Not on file  Food Insecurity: Not on file  Transportation Needs: Not on file  Physical Activity: Not on file  Stress: Not on file  Social Connections: Not on file  Intimate Partner Violence: Not on file   Current Meds  Medication Sig   Cholecalciferol 1.25 MG (50000 UT) capsule Take 1 capsule (50,000 Units total) by mouth once a week. D/c after 6 months and take vitamin D3 4000 IU daily otc   Semaglutide, 1 MG/DOSE, 4 MG/3ML SOPN Inject 1 mg as directed once a week.   simvastatin (ZOCOR) 10 MG tablet Take 1 tablet (10 mg total) by mouth at bedtime.   [DISCONTINUED] Semaglutide,0.25 or 0.5MG/DOS, (OZEMPIC, 0.25 OR 0.5 MG/DOSE,) 2 MG/1.5ML SOPN Inject 0.25 mg into the skin once a week. X 1 month increase to 0.5 weekly month 2. If tolerated increase to 1 mg weekly month 3 and 2 mg weekly month 4   No Known Allergies No results found for this or any previous visit (from the past 2160 hour(s)). Objective  Body mass index is 29.52 kg/m. Wt Readings from Last 3  Encounters:  11/20/21 177 lb 6.4 oz (80.5 kg)  05/01/21 193 lb 6.4 oz (87.7 kg)  05/01/21 194 lb 3.2 oz (88.1 kg)   Temp Readings from Last 3 Encounters:  11/20/21 98.3 F (36.8 C) (Oral)  05/01/21 98.7 F (  37.1 C) (Oral)  05/01/21 98.2 F (36.8 C) (Temporal)   BP Readings from Last 3 Encounters:  11/20/21 126/80  05/01/21 122/78  05/01/21 126/80   Pulse Readings from Last 3 Encounters:  11/20/21 70  05/01/21 75  05/01/21 72    Physical Exam Vitals and nursing note reviewed.  Constitutional:      Appearance: Normal appearance. She is well-developed and well-groomed.  HENT:     Head: Normocephalic and atraumatic.  Eyes:     Conjunctiva/sclera: Conjunctivae normal.     Pupils: Pupils are equal, round, and reactive to light.  Cardiovascular:     Rate and Rhythm: Normal rate and regular rhythm.     Heart sounds: Normal heart sounds. No murmur heard. Pulmonary:     Effort: Pulmonary effort is normal.     Breath sounds: Normal breath sounds.  Abdominal:     General: Abdomen is flat. Bowel sounds are normal.     Tenderness: There is no abdominal tenderness.  Musculoskeletal:        General: No tenderness.       Arms:  Skin:    General: Skin is warm and dry.  Neurological:     General: No focal deficit present.     Mental Status: She is alert and oriented to person, place, and time. Mental status is at baseline.     Cranial Nerves: Cranial nerves 2-12 are intact.     Motor: Motor function is intact.     Coordination: Coordination is intact.     Gait: Gait is intact.  Psychiatric:        Attention and Perception: Attention and perception normal.        Mood and Affect: Mood and affect normal.        Speech: Speech normal.        Behavior: Behavior normal. Behavior is cooperative.        Thought Content: Thought content normal.        Cognition and Memory: Cognition and memory normal.        Judgment: Judgment normal.     Assessment  Plan  Chronic scapular  pain s/p rotator cuff- Plan: DG Scapula Right, DG Shoulder Right Acute pain of right shoulder - Plan: DG Scapula Right, DG Shoulder Right, CBC with Differential/Platelet  Liver mass - Plan: MR LIVER W WO CONTRAST Liver disease - Plan: MR LIVER W WO CONTRAST   BMI 32.0-32.9,adult - Plan: Semaglutide, 1 MG/DOSE, 4 MG/3ML SOPN  Type 2 diabetes mellitus without complication, without long-term current use of insulin (HCC) - Plan: Semaglutide, 1 MG/DOSE, 4 MG/3ML SOPN, Comprehensive metabolic panel, Lipid panel, Hemoglobin A1c, CBC with Differential/Platelet  Hyperlipidemia, unspecified hyperlipidemia type - Plan: Semaglutide, 1 MG/DOSE, 4 MG/3ML SOPN, Comprehensive metabolic panel, Lipid panel  Vitamin D deficiency - Plan: Vitamin D (25 hydroxy)   HM Never flu shot  Tdap declines, pna shots declines 03/06/21  3/3 moderna declines 4th dose MMR immune and hep B immune  Ordered hep C lab neg 12/23/19    mammo [facility] order in overdue   Get copy prior pap [facility] s/p hysterectomy in 2006/2007 for endometriosis she reports h/o abnormal pap had total hysterectomy  -consider pap in future at f/u h/o abnormal pap [facility]    rec smoking cessation smoker since age 55 y.o max 1 ppd now < 1ppd    Colonoscopy referred Dr. Alice Reichert GI tbd date of colonoscopy   [facility] 02/21/21 eye exam no mention of dm changes  in eye but not sure if screened per the notes    Dentist Dr. Bronson Curb    Provider: Dr. Olivia Mackie McLean-Scocuzza-Internal Medicine

## 2021-11-21 ENCOUNTER — Telehealth: Payer: Self-pay

## 2021-11-21 NOTE — Telephone Encounter (Signed)
LMOM for pt to Cb in regards to labs:   McLean-Scocuzza, Nino Glow, MD  Gracy Racer, CMA Cholesterol elevated  -is she taking zocor? If so is she agreeable to increase dose to 20 mg?  Hemoglobin elevated likely due to smoking rec she stop  Vitamin D low sent Rx 1x per week to pharmacy  Liver kidneys normal  A1c improved continue ozempic   I know she saw them on MyChart but Dr. Olivia Mackie had some questions in regards to her meds.

## 2021-11-26 NOTE — Addendum Note (Signed)
Addended by: Orland Mustard on: 11/26/2021 12:46 PM   Modules accepted: Orders

## 2021-11-27 ENCOUNTER — Encounter: Payer: Self-pay | Admitting: Internal Medicine

## 2021-12-07 ENCOUNTER — Other Ambulatory Visit: Payer: Self-pay | Admitting: Internal Medicine

## 2021-12-07 DIAGNOSIS — Z6832 Body mass index (BMI) 32.0-32.9, adult: Secondary | ICD-10-CM

## 2021-12-07 DIAGNOSIS — E785 Hyperlipidemia, unspecified: Secondary | ICD-10-CM

## 2021-12-07 DIAGNOSIS — E119 Type 2 diabetes mellitus without complications: Secondary | ICD-10-CM

## 2021-12-12 MED ORDER — SEMAGLUTIDE (2 MG/DOSE) 8 MG/3ML ~~LOC~~ SOPN: 2.0000 mg | PEN_INJECTOR | SUBCUTANEOUS | 5 refills | Status: DC

## 2021-12-12 NOTE — Addendum Note (Signed)
Addended by: Orland Mustard on: 12/12/2021 05:39 PM   Modules accepted: Orders

## 2021-12-17 ENCOUNTER — Telehealth: Payer: Self-pay | Admitting: Internal Medicine

## 2021-12-17 NOTE — Telephone Encounter (Signed)
Lft pt vm to call ofc . thanks 

## 2022-01-15 ENCOUNTER — Emergency Department: Payer: Commercial Managed Care - PPO

## 2022-01-15 ENCOUNTER — Emergency Department
Admission: EM | Admit: 2022-01-15 | Discharge: 2022-01-15 | Disposition: A | Discharge status: Home / Self Care | Payer: Commercial Managed Care - PPO | Attending: Emergency Medicine | Admitting: Emergency Medicine

## 2022-01-15 ENCOUNTER — Other Ambulatory Visit: Payer: Self-pay

## 2022-01-15 DIAGNOSIS — E119 Type 2 diabetes mellitus without complications: Secondary | ICD-10-CM | POA: Insufficient documentation

## 2022-01-15 DIAGNOSIS — N3001 Acute cystitis with hematuria: Secondary | ICD-10-CM | POA: Insufficient documentation

## 2022-01-15 DIAGNOSIS — Z8616 Personal history of COVID-19: Secondary | ICD-10-CM | POA: Insufficient documentation

## 2022-01-15 DIAGNOSIS — N200 Calculus of kidney: Secondary | ICD-10-CM | POA: Diagnosis not present

## 2022-01-15 DIAGNOSIS — R1030 Lower abdominal pain, unspecified: Secondary | ICD-10-CM | POA: Diagnosis present

## 2022-01-15 LAB — CBC WITH DIFFERENTIAL/PLATELET
Abs Immature Granulocytes: 0.01 10*3/uL (ref 0.00–0.07)
Basophils Absolute: 0 10*3/uL (ref 0.0–0.1)
Basophils Relative: 1 %
Eosinophils Absolute: 0.1 10*3/uL (ref 0.0–0.5)
Eosinophils Relative: 1 %
HCT: 46.1 % — ABNORMAL HIGH (ref 36.0–46.0)
Hemoglobin: 15.4 g/dL — ABNORMAL HIGH (ref 12.0–15.0)
Immature Granulocytes: 0 %
Lymphocytes Relative: 42 %
Lymphs Abs: 2.9 10*3/uL (ref 0.7–4.0)
MCH: 29.4 pg (ref 26.0–34.0)
MCHC: 33.4 g/dL (ref 30.0–36.0)
MCV: 88.1 fL (ref 80.0–100.0)
Monocytes Absolute: 0.7 10*3/uL (ref 0.1–1.0)
Monocytes Relative: 10 %
Neutro Abs: 3.2 10*3/uL (ref 1.7–7.7)
Neutrophils Relative %: 46 %
Platelets: 293 10*3/uL (ref 150–400)
RBC: 5.23 MIL/uL — ABNORMAL HIGH (ref 3.87–5.11)
RDW: 14.5 % (ref 11.5–15.5)
WBC: 6.9 10*3/uL (ref 4.0–10.5)
nRBC: 0 % (ref 0.0–0.2)

## 2022-01-15 LAB — URINALYSIS, ROUTINE W REFLEX MICROSCOPIC
Bilirubin Urine: NEGATIVE
Glucose, UA: NEGATIVE mg/dL
Ketones, ur: NEGATIVE mg/dL
Leukocytes,Ua: NEGATIVE
Nitrite: NEGATIVE
Protein, ur: 100 mg/dL — AB
RBC / HPF: >50 RBC/hpf — ABNORMAL HIGH (ref 0–5)
Specific Gravity, Urine: 1.014 (ref 1.005–1.030)
WBC, UA: >50 WBC/hpf — ABNORMAL HIGH (ref 0–5)
pH: 6 (ref 5.0–8.0)

## 2022-01-15 LAB — BASIC METABOLIC PANEL
Anion gap: 7 (ref 5–15)
BUN: 16 mg/dL (ref 6–20)
CO2: 26 mmol/L (ref 22–32)
Calcium: 9.3 mg/dL (ref 8.9–10.3)
Chloride: 106 mmol/L (ref 98–111)
Creatinine, Ser: 0.73 mg/dL (ref 0.44–1.00)
GFR, Estimated: >60 mL/min (ref >60)
Glucose, Bld: 80 mg/dL (ref 70–99)
Potassium: 3.9 mmol/L (ref 3.5–5.1)
Sodium: 139 mmol/L (ref 135–145)

## 2022-01-15 MED ORDER — KETOROLAC TROMETHAMINE 15 MG/ML IJ SOLN
15.0000 mg | Freq: Once | INTRAMUSCULAR | Status: AC
Start: 2022-01-15 — End: 2022-01-15
  Administered 2022-01-15: 15 mg via INTRAVENOUS
  Filled 2022-01-15: qty 1

## 2022-01-15 MED ORDER — CEFDINIR 300 MG PO CAPS
300.0000 mg | ORAL_CAPSULE | Freq: Two times a day (BID) | ORAL | 0 refills | Status: AC
Start: 2022-01-15 — End: 2022-01-22

## 2022-01-15 MED ORDER — IOHEXOL 300 MG/ML  SOLN
100.0000 mL | Freq: Once | INTRAMUSCULAR | Status: AC | PRN
  Administered 2022-01-15: 100 mL via INTRAVENOUS

## 2022-01-15 MED ORDER — SODIUM CHLORIDE 0.9 % IV BOLUS
1000.0000 mL | Freq: Once | INTRAVENOUS | Status: AC
  Administered 2022-01-15: 1000 mL via INTRAVENOUS

## 2022-01-15 NOTE — ED Notes (Addendum)
Pt non-tender in lower abdomen. Pt given remote to tv and warm blanket. Pt placed on monitor. Call bell within reach, stretcher locked low, rail up.

## 2022-01-15 NOTE — ED Notes (Signed)
Pt reports 10/10 sharp lower abdominal pain; states spread across abdomen from medial area then goes away; intermittent; urine sent by PA was noted as dark amber; pt denies fever; pt otherwise denies changes to urination.

## 2022-01-15 NOTE — ED Provider Notes (Signed)
[facility] Provider Note    Event Date/Time   First MD Initiated Contact with Patient 01/15/22 1741     (approximate)   History   Flank Pain   HPI  Lori Lang is a 51 y.o. female with a past medical history of type 2 diabetes, obesity, fatty liver, chronic pain syndrome, sciatica, who presents today for evaluation of suprapubic abdominal pain that began at approximately 3 PM today.  Patient reports that it feels exactly the same as her previous kidney stones.  She also reports that she has hematuria which is also consistent with her previous kidney stones.  She denies nausea or vomiting.  She denies any flank pain.  She denies fevers or chills.  She has not taken anything for her symptoms.  Patient Active Problem List   Diagnosis Date Noted   BMI 32.0-32.9,adult 09/27/2021   Type 2 diabetes mellitus without complication, without long-term current use of insulin ([place]) 09/27/2021   Hyperlipidemia 09/27/2021   Hepatic steatosis 05/01/2021   Obesity (BMI 30-39.9) 04/17/2021   COVID-19 04/17/2021   Frontal sinusitis 04/17/2021   Liver hemangioma 03/14/2021   Liver lesion 03/14/2021   Fatty liver 03/14/2021   Aortic atherosclerosis ([place]) 12/26/2019   Overweight (BMI 25.0-29.9) 12/22/2019   Lumbar radiculopathy 05/25/2019   Sciatica (Left) 07/14/2018   Chronic pain syndrome 07/14/2018   Pharmacologic therapy 07/14/2018   Disorder of skeletal system 07/14/2018   Problems influencing health status 07/14/2018   Failed back surgical syndrome 07/14/2018   Chronic low back pain (Secondary Area of Pain) (Left) w/ sciatica (Left) 07/14/2018   Chronic lower extremity pain (Primary area of Pain) (Left) 07/14/2018   Lumbar facet arthropathy 07/14/2018   DDD (degenerative disc disease), lumbosacral 07/14/2018   Leg cramps (Bilateral) 07/14/2018   Displacement of lumbar intervertebral disc (L5-S1) w/ radiculopathy (S1) (Left) 07/14/2018   Lumbar spondylosis  07/14/2018   Chronic musculoskeletal pain 07/14/2018   Neurogenic pain 07/14/2018   Abnormal MRI, lumbar spine 07/14/2018   Nocturnal leg cramps (Bilateral) 06/22/2018   Vitamin D deficiency 08/31/2017   HLD (hyperlipidemia) 08/31/2017   DM2 (diabetes mellitus, type 2) ([place]) 08/31/2017   Tobacco abuse 08/31/2017   Polycythemia 08/31/2017   Hot flashes 08/25/2017   Lumbosacral radiculopathy (S1) (Left) 08/25/2017          Physical Exam   Triage Vital Signs: ED Triage Vitals  Enc Vitals Group     BP      Pulse      Resp      Temp      Temp src      SpO2      Weight      Height      Head Circumference      Peak Flow      Pain Score      Pain Loc      Pain Edu?      Excl. in [place]?     Most recent vital signs: Vitals:   01/15/22 1742 01/15/22 1753  BP: 123/76 123/76  Pulse: 76 76  Resp: 18 18  Temp: 98 F (36.7 C) 98 F (36.7 C)  SpO2: 100% 100%    Physical Exam Vitals and nursing note reviewed.  Constitutional:      General: Awake and alert. No acute distress.    Appearance: Normal appearance. The patient is normal weight.  HENT:     Head: Normocephalic and atraumatic.     Mouth: Mucous membranes are  moist.  Eyes:     General: PERRL. Normal EOMs        Right eye: No discharge.        Left eye: No discharge.     Conjunctiva/sclera: Conjunctivae normal.  Cardiovascular:     Rate and Rhythm: Normal rate and regular rhythm.     Pulses: Normal pulses.     Heart sounds: Normal heart sounds Pulmonary:     Effort: Pulmonary effort is normal. No respiratory distress.     Breath sounds: Normal breath sounds.  Abdominal:     Abdomen is soft. There is no abdominal tenderness. No rebound or guarding. No distention.   No CVA tenderness Musculoskeletal:        General: No swelling. Normal range of motion.     Cervical back: Normal range of motion and neck supple.  Skin:    General: Skin is warm and dry.     Capillary Refill: Capillary refill takes less than 2  seconds.     Findings: No rash.  Neurological:     Mental Status: The patient is awake and alert.      ED Results / Procedures / Treatments   Labs (all labs ordered are listed, but only abnormal results are displayed) Labs Reviewed  URINALYSIS, ROUTINE W REFLEX MICROSCOPIC - Abnormal; Notable for the following components:      Result Value   Color, Urine AMBER (*)    APPearance CLOUDY (*)    Hgb urine dipstick MODERATE (*)    Protein, ur 100 (*)    RBC / HPF >50 (*)    WBC, UA >50 (*)    Bacteria, UA RARE (*)    All other components within normal limits  CBC WITH DIFFERENTIAL/PLATELET - Abnormal; Notable for the following components:   RBC 5.23 (*)    Hemoglobin 15.4 (*)    HCT 46.1 (*)    All other components within normal limits  BASIC METABOLIC PANEL     EKG     RADIOLOGY I independently reviewed and interpreted imaging and agree with radiologists findings.     PROCEDURES:  Critical Care performed:   Procedures   MEDICATIONS ORDERED IN ED: Medications  sodium chloride 0.9 % bolus 1,000 mL (0 mLs Intravenous Stopped 01/15/22 2000)  ketorolac (TORADOL) 15 MG/ML injection 15 mg (15 mg Intravenous Given 01/15/22 1826)  iohexol (OMNIPAQUE) 300 MG/ML solution 100 mL (100 mLs Intravenous Contrast Given 01/15/22 1901)     IMPRESSION / MDM / ASSESSMENT AND PLAN / ED COURSE  I reviewed the triage vital signs and the nursing notes.   Differential diagnosis includes, but is not limited to, nephrolithiasis, urinary tract infection/pyelonephritis, infected stone, less likely appendicitis or diverticulitis.  Patient is awake and alert, hemodynamically stable and afebrile.  She does have Kool-Aid colored urine.  Labs were obtained and are overall reassuring.  Patient's urinalysis demonstrates greater than 50 white blood cells and greater than 50 RBCs.  CT scan was obtained for evaluation of stone, appendicitis, pyelonephritis, or other intra-abdominal abnormality.  CT  scan demonstrates a recently passed stone, 3 mm that is currently in the bladder.  Upon reevaluation, her symptoms have resolved.  We will treat for urinary tract infection still.  However I do not feel that this is consistent with an infected stone given that the stone is recently/already passed.  We discussed return precautions and the importance of close outpatient follow-up.  Patient understands and agrees with plan.  She was discharged in stable  condition.   Patient's presentation is most consistent with acute complicated illness / injury requiring diagnostic workup.    Clinical Course as of 01/15/22 2312  Wed Jan 15, 2022  1941 Pain has resolved upon re-evaluation [JP]    Clinical Course User Index [JP] [name], Clarnce Flock, PA-C     FINAL CLINICAL IMPRESSION(S) / ED DIAGNOSES   Final diagnoses:  Kidney stone  Acute cystitis with hematuria     Rx / DC Orders   ED Discharge Orders          Ordered    cefdinir (OMNICEF) 300 MG capsule  2 times daily        01/15/22 1942             Note:  This document was prepared using Dragon voice recognition software and may include unintentional dictation errors.   Emeline Gins 01/15/22 2312    Carrie Mew, MD 01/22/22 [phone]

## 2022-01-15 NOTE — ED Triage Notes (Signed)
C/O suprapubic pain. States pain started today around 1530.  States "I'm trying to pass a kidney stone".  Reports hematuria.  AAOx3.  Skin warm and dry. NAD

## 2022-01-15 NOTE — ED Triage Notes (Signed)
Pt reports lower abdominal pain radiating from right side that begun today. Pt reports hx of kidney stones and it feels like the same thing.

## 2022-01-15 NOTE — Discharge Instructions (Addendum)
Your kidney stone has moved to your bladder, see your pain should be significantly improved.  It does appear that you had a urinary tract infection however, take the medications as prescribed.  Please return for any new, worsening, or change in symptoms or other concerns.

## 2022-02-20 ENCOUNTER — Ambulatory Visit: Payer: Commercial Managed Care - PPO | Admitting: Family Medicine

## 2022-02-26 ENCOUNTER — Ambulatory Visit (INDEPENDENT_AMBULATORY_CARE_PROVIDER_SITE_OTHER): Payer: Commercial Managed Care - PPO

## 2022-02-26 ENCOUNTER — Ambulatory Visit: Payer: Commercial Managed Care - PPO | Admitting: Podiatry

## 2022-02-26 DIAGNOSIS — L603 Nail dystrophy: Secondary | ICD-10-CM | POA: Diagnosis not present

## 2022-02-26 DIAGNOSIS — M21611 Bunion of right foot: Secondary | ICD-10-CM | POA: Diagnosis not present

## 2022-02-26 DIAGNOSIS — M2042 Other hammer toe(s) (acquired), left foot: Secondary | ICD-10-CM | POA: Diagnosis not present

## 2022-02-26 NOTE — Progress Notes (Signed)
  Subjective:  Patient ID: Lori Lang, female    DOB: Sep 29, 1970,  MRN: [id number]  Chief Complaint  Patient presents with   Nail Problem    Painful great toenails, possibly ingrown? -she has had ingrown procedures done in the past and now thinks her toenails "don't look right"    51 y.o. female presents with the above complaint. History confirmed with patient.  She returns for follow-up, the fifth toe on the left foot is starting to hurt again she thinks the bone has regrown.  The bunion on the right foot is starting to bother her as well, she says it does not really hurt in the joint around the bump but it makes it feel like her toes are going over further and does not feel right.  Additionally her toenails are hurting her and there feels like there is a thick hard skin underneath there  Objective:  Physical Exam: warm, good capillary refill, no trophic changes or ulcerative lesions, normal DP and PT pulses, and normal sensory exam.  Bilaterally multiple nails there is thickening and dystrophy, hard skin underneath Left Foot:  Left fifth toe PIPJ dorsal lateral there is a small hyperkeratotic lesion Right Foot: She has hallux valgus deformity with a dorsal medial eminence that is slightly tender to palpation  No images are attached to the encounter.  Radiographs: Multiple views x-ray of both feet taken show possible recurrence and regrowth of resection of fifth toe proximal phalanx head, she is mild hallux valgus deformity with large dorsal medial eminence, minimal increase IM angle right 1. Hammertoe of left foot   2. Bunion, right foot   3. Nail dystrophy      Plan:  Patient was evaluated and treated and all questions answered.  We discussed her issue of the nail dystrophy I do not think that there is a simple solution to this other than maintaining regular pedicures and nail health to alleviate this on a regular basis.  I discussed with her that if that is not improving then  permanent total matricectomy of the affected nails could be undertaken but she states she would like to avoid this  We reviewed the recurrent hammertoe on the left and bunion deformity on the right.  I discussed with her that further resection and excision of the corn on the left fifth toe could be undertaken but this may carry risk of a result of a floppy toe.  Also discussed bunion correction on the right foot with a distal metatarsal osteotomy and resection of the dorsal medial eminence.  She has multiple risk factor for complications for this including pack per day smoking history for a number of years, she does have diabetes although it is well controlled, notably she had a very low vitamin D level in August of this year and was 14.3 ng/mL.  I do not see a level taken in the last 3 years higher than 19.  She says she has a supplement that she supposed to take once per week but she does not take this.  I encouraged her to take this once per week for the next 8 weeks and then we can recheck her level to see if it is increasing.  If it is not increased by that point then I would recommend referral to endocrinology to evaluate further.  She will let me know how she is doing when she has completed her supplementation  Return if symptoms worsen or fail to improve.

## 2022-02-26 NOTE — Patient Instructions (Signed)
Take your vitamin D supplement once a week for at least 8 weeks and then let me know so we can recheck it and see if it is high enough for surgery. You would need to stop smoking 6 weeks before and after surgery as well

## 2022-03-16 ENCOUNTER — Other Ambulatory Visit (HOSPITAL_BASED_OUTPATIENT_CLINIC_OR_DEPARTMENT_OTHER): Payer: Self-pay

## 2022-03-16 ENCOUNTER — Ambulatory Visit (HOSPITAL_BASED_OUTPATIENT_CLINIC_OR_DEPARTMENT_OTHER)
Admission: RE | Admit: 2022-03-16 | Discharge: 2022-03-16 | Disposition: A | Discharge status: Home / Self Care | Payer: Commercial Managed Care - PPO | Source: Ambulatory Visit | Attending: Diagnostic Radiology | Admitting: Diagnostic Radiology

## 2022-03-16 DIAGNOSIS — Z1231 Encounter for screening mammogram for malignant neoplasm of breast: Secondary | ICD-10-CM

## 2022-04-09 ENCOUNTER — Encounter: Payer: Commercial Managed Care - PPO | Admitting: Family Medicine

## 2022-04-22 ENCOUNTER — Encounter: Payer: Self-pay | Admitting: *Deleted

## 2022-05-06 NOTE — Progress Notes (Unsigned)
Tomasita Morrow, NP-C Phone: [phone]  Lori Lang is a 52 y.o. female who presents today for transfer of care and annual exam. She has no complaints or new concerns today. She is requesting refills on her Ozempic.   HYPERLIPIDEMIA Symptoms Chest pain on exertion:  No   Leg claudication:   No Medications: Compliance- Simvastatin- has not been taking Right upper quadrant pain- No  Muscle aches- No Lipid Panel     Component Value Date/Time   CHOL 205 (H) 11/20/2021 1047   TRIG 125.0 11/20/2021 1047   HDL 38.50 (L) 11/20/2021 1047   CHOLHDL 5 11/20/2021 1047   VLDL 25.0 11/20/2021 1047   LDLCALC 141 (H) 11/20/2021 1047   LDLCALC 146 (H) 08/28/2017 0923   DIABETES Disease Monitoring: Blood Sugar ranges- Not checking Polyuria/phagia/dipsia- No      Optho- No Medications: Compliance- Ozempic Hypoglycemic symptoms- No Lab Results  Component Value Date   HGBA1C 6.3 11/20/2021   Diet: Drinks a lot of juice, usually eats a lot of red meat, not eating red meat this month, reports decreased appetite since being on Ozempic Exercise: None Pap smear: Hysterectomy in 2007 Colonoscopy: Never Mammogram: 03/16/2022 Family history-  Colon cancer: No  Breast cancer: No  Ovarian cancer: No Menses: Hysterectomy Sexually active: Yes Vaccines-   Flu: Declined  Tetanus: UTD- 5 years ago  Shingles: Interested- patient will look into  COVID19: x 2 HIV screening: Today Hep C Screening: Negative Tobacco use: Yes- less than 1 pack per day Alcohol use: No Illicit Drug use: No Dentist: Yes Ophthalmology: Yes  Social History   Tobacco Use  Smoking Status Every Day   Packs/day: 1.00   Years: 30.00   Total pack years: 30.00   Types: Cigarettes  Smokeless Tobacco Never  Tobacco Comments   0.75PPD 05/01/2021    No current outpatient medications on file prior to visit.   No current facility-administered medications on file prior to visit.    ROS see history of present  illness  Objective  Physical Exam Vitals:   05/07/22 1011  BP: 122/62  Pulse: (!) 110  Temp: 97.9 F (36.6 C)  SpO2: 99%    BP Readings from Last 3 Encounters:  05/07/22 122/62  01/15/22 123/76  11/20/21 126/80   Wt Readings from Last 3 Encounters:  05/07/22 152 lb 3.2 oz (69 kg)  01/15/22 167 lb (75.8 kg)  11/20/21 177 lb 6.4 oz (80.5 kg)    Physical Exam Constitutional:      General: She is not in acute distress.    Appearance: Normal appearance.  HENT:     Head: Normocephalic.     Right Ear: Tympanic membrane normal.     Left Ear: Tympanic membrane normal.     Nose: Nose normal.     Mouth/Throat:     Mouth: Mucous membranes are moist.     Pharynx: Oropharynx is clear.  Eyes:     Conjunctiva/sclera: Conjunctivae normal.     Pupils: Pupils are equal, round, and reactive to light.  Neck:     Thyroid: No thyromegaly.  Cardiovascular:     Rate and Rhythm: Normal rate and regular rhythm.     Heart sounds: Normal heart sounds.  Pulmonary:     Effort: Pulmonary effort is normal.     Breath sounds: Normal breath sounds.  Abdominal:     General: Abdomen is flat. Bowel sounds are normal.     Palpations: Abdomen is soft. There is no mass.  Tenderness: There is no abdominal tenderness.  Musculoskeletal:        General: Normal range of motion.  Lymphadenopathy:     Cervical: No cervical adenopathy.  Skin:    General: Skin is warm and dry.     Findings: No rash.  Neurological:     General: No focal deficit present.     Mental Status: She is alert.  Psychiatric:        Mood and Affect: Mood normal.        Behavior: Behavior normal.    Assessment/Plan: Please see individual problem list.  Preventative health care Assessment & Plan: Physical exam complete. Lab work as outlined. Colon due- Cologuard order sent. Mammo due in December. Shingles vaccine due, patient wants to look into it first, will schedule. HIV screening today. CT Lung Cancer screening due  will refer to Pulmonology. Encouraged healthy diet and exercise.   Orders: -     CBC with Differential/Platelet -     Comprehensive metabolic panel -     Lipid panel -     TSH -     Hemoglobin A1c  Type 2 diabetes mellitus without complication, without long-term current use of insulin (HCC) Assessment & Plan: Stable on Ozempic 2 mg weekly. Continue. Last A1c- 6.3, will check today. Encouraged healthy diet and exercise. Advised decreasing juice and increasing water intake.   Orders: -     Hemoglobin A1c -     Microalbumin / creatinine urine ratio -     Semaglutide (2 MG/DOSE); Inject 2 mg as directed once a week.  Dispense: 3 mL; Refill: 5 -     Simvastatin; Take 1 tablet (10 mg total) by mouth at bedtime.  Dispense: 90 tablet; Refill: 3  Hyperlipidemia, unspecified hyperlipidemia type Assessment & Plan: Chronic. Uncontrolled. The 10-year ASCVD risk score (Arnett DK, et al., 2019) is: 15.3%. Patient has not been taking Simvastatin. Will check fasting lipids today. Advised patient to start back on Simvastatin 10 mg daily, refills sent.    Orders: -     Comprehensive metabolic panel -     Lipid panel -     Simvastatin; Take 1 tablet (10 mg total) by mouth at bedtime.  Dispense: 90 tablet; Refill: 3  Liver hemangioma Assessment & Plan: CT Abd/Pelvis on 01/15/2022-  Hepatic steatosis with a 4.8 cm x 3.7 cm x 2.6 cm hemangioma within the liver dome. Patient awaiting MRI with sedation due to claustrophobia, ordered over a year ago. Will refer to GI for further evaluation/MRI options if further imaging necessary.   Orders: -     Ambulatory referral to Gastroenterology  Vitamin D deficiency Assessment & Plan: Patient stopped taking supplement. Will check level today.   Orders: -     VITAMIN D 25 Hydroxy (Vit-D Deficiency, Fractures)  Encounter for screening for HIV -     HIV Antibody (routine testing w rflx)  Thyroid disorder screen -     TSH  Screening for lung cancer -      Ambulatory referral to Pulmonology  Screen for colon cancer -     Cologuard   Return in about 1 year (around 05/08/2023).   Tomasita Morrow, NP-C [place]

## 2022-05-07 ENCOUNTER — Ambulatory Visit: Payer: Commercial Managed Care - PPO | Admitting: Nurse Practitioner

## 2022-05-07 ENCOUNTER — Encounter: Payer: Self-pay | Admitting: Nurse Practitioner

## 2022-05-07 VITALS — BP 122/62 | HR 110 | Temp 97.9°F | Ht 65.0 in | Wt 152.2 lb

## 2022-05-07 DIAGNOSIS — Z0001 Encounter for general adult medical examination with abnormal findings: Secondary | ICD-10-CM

## 2022-05-07 DIAGNOSIS — D1803 Hemangioma of intra-abdominal structures: Secondary | ICD-10-CM

## 2022-05-07 DIAGNOSIS — Z1329 Encounter for screening for other suspected endocrine disorder: Secondary | ICD-10-CM | POA: Diagnosis not present

## 2022-05-07 DIAGNOSIS — Z0000 Encounter for general adult medical examination without abnormal findings: Secondary | ICD-10-CM

## 2022-05-07 DIAGNOSIS — E119 Type 2 diabetes mellitus without complications: Secondary | ICD-10-CM

## 2022-05-07 DIAGNOSIS — E559 Vitamin D deficiency, unspecified: Secondary | ICD-10-CM | POA: Diagnosis not present

## 2022-05-07 DIAGNOSIS — E785 Hyperlipidemia, unspecified: Secondary | ICD-10-CM | POA: Diagnosis not present

## 2022-05-07 DIAGNOSIS — Z114 Encounter for screening for human immunodeficiency virus [HIV]: Secondary | ICD-10-CM

## 2022-05-07 DIAGNOSIS — Z122 Encounter for screening for malignant neoplasm of respiratory organs: Secondary | ICD-10-CM

## 2022-05-07 DIAGNOSIS — Z1211 Encounter for screening for malignant neoplasm of colon: Secondary | ICD-10-CM

## 2022-05-07 LAB — LIPID PANEL
Cholesterol: 172 mg/dL (ref 0–200)
HDL: 38.2 mg/dL — ABNORMAL LOW (ref >39.00)
LDL Cholesterol: 114 mg/dL — ABNORMAL HIGH (ref 0–99)
NonHDL: 134.05
Total CHOL/HDL Ratio: 5
Triglycerides: 98 mg/dL (ref 0.0–149.0)
VLDL: 19.6 mg/dL (ref 0.0–40.0)

## 2022-05-07 LAB — MICROALBUMIN / CREATININE URINE RATIO
Creatinine,U: 252.8 mg/dL
Microalb Creat Ratio: 0.6 mg/g (ref 0.0–30.0)
Microalb, Ur: 1.4 mg/dL (ref 0.0–1.9)

## 2022-05-07 LAB — COMPREHENSIVE METABOLIC PANEL
ALT: 10 U/L (ref 0–35)
AST: 11 U/L (ref 0–37)
Albumin: 3.8 g/dL (ref 3.5–5.2)
Alkaline Phosphatase: 77 U/L (ref 39–117)
BUN: 15 mg/dL (ref 6–23)
CO2: 25 mEq/L (ref 19–32)
Calcium: 9.3 mg/dL (ref 8.4–10.5)
Chloride: 107 mEq/L (ref 96–112)
Creatinine, Ser: 0.66 mg/dL (ref 0.40–1.20)
GFR: 100.3 mL/min (ref >60.00)
Glucose, Bld: 75 mg/dL (ref 70–99)
Potassium: 3.8 mEq/L (ref 3.5–5.1)
Sodium: 139 mEq/L (ref 135–145)
Total Bilirubin: 0.5 mg/dL (ref 0.2–1.2)
Total Protein: 6.5 g/dL (ref 6.0–8.3)

## 2022-05-07 LAB — CBC WITH DIFFERENTIAL/PLATELET
Basophils Absolute: 0 10*3/uL (ref 0.0–0.1)
Basophils Relative: 0.2 % (ref 0.0–3.0)
Eosinophils Absolute: 0.1 10*3/uL (ref 0.0–0.7)
Eosinophils Relative: 1.5 % (ref 0.0–5.0)
HCT: 42.7 % (ref 36.0–46.0)
Hemoglobin: 14.8 g/dL (ref 12.0–15.0)
Lymphocytes Relative: 42.5 % (ref 12.0–46.0)
Lymphs Abs: 2.2 10*3/uL (ref 0.7–4.0)
MCHC: 34.6 g/dL (ref 30.0–36.0)
MCV: 89.4 fl (ref 78.0–100.0)
Monocytes Absolute: 0.6 10*3/uL (ref 0.1–1.0)
Monocytes Relative: 11.9 % (ref 3.0–12.0)
Neutro Abs: 2.3 10*3/uL (ref 1.4–7.7)
Neutrophils Relative %: 43.9 % (ref 43.0–77.0)
Platelets: 308 10*3/uL (ref 150.0–400.0)
RBC: 4.77 Mil/uL (ref 3.87–5.11)
RDW: 13.9 % (ref 11.5–15.5)
WBC: 5.3 10*3/uL (ref 4.0–10.5)

## 2022-05-07 LAB — TSH: TSH: 0.92 u[IU]/mL (ref 0.35–5.50)

## 2022-05-07 LAB — VITAMIN D 25 HYDROXY (VIT D DEFICIENCY, FRACTURES): VITD: 12.5 ng/mL — ABNORMAL LOW (ref 30.00–100.00)

## 2022-05-07 LAB — HEMOGLOBIN A1C: Hgb A1c MFr Bld: 5.5 % (ref 4.6–6.5)

## 2022-05-07 MED ORDER — SEMAGLUTIDE (2 MG/DOSE) 8 MG/3ML ~~LOC~~ SOPN: 2.0000 mg | PEN_INJECTOR | SUBCUTANEOUS | 5 refills | Status: DC

## 2022-05-07 MED ORDER — SIMVASTATIN 10 MG PO TABS: 10.0000 mg | ORAL_TABLET | Freq: Every day | ORAL | 3 refills | Status: DC

## 2022-05-07 NOTE — Assessment & Plan Note (Addendum)
Stable on Ozempic 2 mg weekly. Continue. Last A1c- 6.3, will check today. Encouraged healthy diet and exercise. Advised decreasing juice and increasing water intake.

## 2022-05-07 NOTE — Assessment & Plan Note (Signed)
Patient stopped taking supplement. Will check level today.

## 2022-05-07 NOTE — Assessment & Plan Note (Addendum)
CT Abd/Pelvis on 01/15/2022-  Hepatic steatosis with a 4.8 cm x 3.7 cm x 2.6 cm hemangioma within the liver dome. Patient awaiting MRI with sedation due to claustrophobia, ordered over a year ago. Will refer to GI for further evaluation/MRI options if further imaging necessary.

## 2022-05-07 NOTE — Assessment & Plan Note (Addendum)
Physical exam complete. Lab work as outlined. Colon due- Cologuard order sent. Mammo due in December. Shingles vaccine due, patient wants to look into it first, will schedule. HIV screening today. CT Lung Cancer screening due will refer to Pulmonology. Encouraged healthy diet and exercise.

## 2022-05-07 NOTE — Assessment & Plan Note (Signed)
Chronic. Uncontrolled. The 10-year ASCVD risk score (Arnett DK, et al., 2019) is: 15.3%. Patient has not been taking Simvastatin. Will check fasting lipids today. Advised patient to start back on Simvastatin 10 mg daily, refills sent.

## 2022-05-08 LAB — HIV ANTIBODY (ROUTINE TESTING W REFLEX): HIV 1&2 Ab, 4th Generation: NONREACTIVE

## 2022-05-09 ENCOUNTER — Other Ambulatory Visit: Payer: Self-pay | Admitting: Nurse Practitioner

## 2022-05-09 DIAGNOSIS — E559 Vitamin D deficiency, unspecified: Secondary | ICD-10-CM

## 2022-05-09 MED ORDER — VITAMIN D (ERGOCALCIFEROL) 1.25 MG (50000 UNIT) PO CAPS: 50000.0000 [IU] | ORAL_CAPSULE | ORAL | 1 refills | Status: DC

## 2022-05-09 NOTE — Assessment & Plan Note (Signed)
Starting patient on Vitamin D 50,000 units once weekly for 6 months then can switch to OTC daily dosing. Will monitor.

## 2022-05-15 NOTE — Addendum Note (Signed)
Addended by: Tomasita Morrow on: 05/15/2022 12:54 PM   Modules accepted: Orders

## 2022-05-27 LAB — COLOGUARD: COLOGUARD: NEGATIVE

## 2022-11-05 ENCOUNTER — Other Ambulatory Visit: Payer: Self-pay

## 2022-11-05 ENCOUNTER — Emergency Department: Payer: Commercial Managed Care - PPO

## 2022-11-05 DIAGNOSIS — R079 Chest pain, unspecified: Secondary | ICD-10-CM | POA: Insufficient documentation

## 2022-11-05 DIAGNOSIS — R200 Anesthesia of skin: Secondary | ICD-10-CM | POA: Insufficient documentation

## 2022-11-05 DIAGNOSIS — R0602 Shortness of breath: Secondary | ICD-10-CM | POA: Insufficient documentation

## 2022-11-05 DIAGNOSIS — Z5321 Procedure and treatment not carried out due to patient leaving prior to being seen by health care provider: Secondary | ICD-10-CM | POA: Insufficient documentation

## 2022-11-05 NOTE — ED Triage Notes (Addendum)
Pt reports acute onset of pressure like chest pain substernal. Associated SOB. Reports L arm tingling as well. Pt denies hx of same. Pt alert and oriented. Ambulatory to triage and placed in wheelchair. Pt alert and oriented following commands. Pt reports had COVID ~ 1wk ago

## 2022-11-05 NOTE — ED Notes (Signed)
2 attempts made at blood draw without success. RN called lab requesting assistance.

## 2022-11-06 ENCOUNTER — Emergency Department
Admission: EM | Admit: 2022-11-06 | Discharge: 2022-11-06 | Discharge status: Against Medical Advice | Payer: Commercial Managed Care - PPO | Attending: Emergency Medicine | Admitting: Emergency Medicine

## 2022-11-06 LAB — COMPREHENSIVE METABOLIC PANEL
ALT: 65 U/L — ABNORMAL HIGH (ref 0–44)
AST: 132 U/L — ABNORMAL HIGH (ref 15–41)
Albumin: 3.9 g/dL (ref 3.5–5.0)
Alkaline Phosphatase: 88 U/L (ref 38–126)
Anion gap: 11 (ref 5–15)
BUN: 18 mg/dL (ref 6–20)
CO2: 24 mmol/L (ref 22–32)
Calcium: 9.1 mg/dL (ref 8.9–10.3)
Chloride: 103 mmol/L (ref 98–111)
Creatinine, Ser: 0.73 mg/dL (ref 0.44–1.00)
GFR, Estimated: >60 mL/min (ref >60)
Glucose, Bld: 95 mg/dL (ref 70–99)
Potassium: 3.5 mmol/L (ref 3.5–5.1)
Sodium: 138 mmol/L (ref 135–145)
Total Bilirubin: 0.9 mg/dL (ref 0.3–1.2)
Total Protein: 7.5 g/dL (ref 6.5–8.1)

## 2022-11-06 LAB — CBC
HCT: 42.6 % (ref 36.0–46.0)
Hemoglobin: 14.9 g/dL (ref 12.0–15.0)
MCH: 30.5 pg (ref 26.0–34.0)
MCHC: 35 g/dL (ref 30.0–36.0)
MCV: 87.3 fL (ref 80.0–100.0)
Platelets: 331 10*3/uL (ref 150–400)
RBC: 4.88 MIL/uL (ref 3.87–5.11)
RDW: 13.5 % (ref 11.5–15.5)
WBC: 6 10*3/uL (ref 4.0–10.5)
nRBC: 0 % (ref 0.0–0.2)

## 2022-11-06 LAB — TROPONIN I (HIGH SENSITIVITY): Troponin I (High Sensitivity): 5 ng/L (ref <18)

## 2022-11-10 ENCOUNTER — Telehealth: Payer: Self-pay

## 2022-11-10 NOTE — Transitions of Care (Post Inpatient/ED Visit) (Unsigned)
I spoke with pt; pt had chest pain and pt said LWBS because of long wait at ED; pt was told at ED that EKG was normal; pt left and went home and after vomiting pt said CP stopped. no CP since vomitied x 1. On 11/06/22.pt is presently only taking semaglutide weekly; no other meds being taken,. Pt was given UC & ED precautions and pt will call [facility] on 11/11/22 to schedule ED FU to review labs. Sending note to Bethanie Dicker NP.     11/10/2022  Name: Lori Lang MRN: [id number] DOB: [birth year]  Today's TOC FU Call Status: Today's TOC FU Call Status:: Successful TOC FU Call Completed TOC FU Call Complete Date: 11/10/22  Transition Care Management Follow-up Telephone Call Date of Discharge: 11/06/22 Discharge Facility: [facility]) Type of Discharge: Emergency Department Reason for ED Visit: Other: (chest pain and pt said LWBS because of long wait at ED; pt was told EKG was normal; pt left and went home and after vomiting pt said CP stopped. no CP since vomitied x 1.) How have you been since you were released from the hospital?: Better Any questions or concerns?: No  Items Reviewed: Did you receive and understand the discharge instructions provided?: Yes (pt said duer to wait and pt was told EKG was norma while at ED pt decided to leave and go home.) Medications obtained,verified, and reconciled?: Yes (Medications Reviewed) (only med pt is stakingnow is semiglutide weekly.) Any new allergies since your discharge?: No Dietary orders reviewed?: NA Do you have support at home?: Yes People in Home: spouse Name of Support/Comfort Primary Source: Ethelene Browns  Medications Reviewed Today: Medications Reviewed Today   Medications were not reviewed in this encounter     Home Care and Equipment/Supplies: Were Home Health Services Ordered?: NA Any new equipment or medical supplies ordered?: NA  Functional Questionnaire: Do you need assistance with  bathing/showering or dressing?: No Do you need assistance with meal preparation?: No Do you need assistance with eating?: No Do you have difficulty maintaining continence: No Do you need assistance with getting out of bed/getting out of a chair/moving?: No Do you have difficulty managing or taking your medications?: No  Follow up appointments reviewed: PCP Follow-up appointment confirmed?: No (pt will call [facility] on 08/20/24and schedule ED FU to review lab reslts with Jacqualin Combes NP.) MD Provider Line Number:[phone] Given: Yes Specialist Hospital Follow-up appointment confirmed?: NA Do you need transportation to your follow-up appointment?: No Do you understand care options if your condition(s) worsen?: Yes-patient verbalized understanding    SIGNATURE Lewanda Rife, LPN

## 2022-12-23 ENCOUNTER — Inpatient Hospital Stay
Admission: EM | Admit: 2022-12-23 | Discharge: 2022-12-24 | DRG: 419 | Disposition: A | Discharge status: Home / Self Care | Payer: Commercial Managed Care - PPO | Attending: Surgery | Admitting: Surgery

## 2022-12-23 ENCOUNTER — Observation Stay: Payer: Commercial Managed Care - PPO | Admitting: Certified Registered"

## 2022-12-23 ENCOUNTER — Emergency Department: Payer: Commercial Managed Care - PPO

## 2022-12-23 ENCOUNTER — Encounter
Admission: EM | Disposition: A | Discharge status: Home / Self Care | Payer: Self-pay | Source: Home / Self Care | Attending: Surgery

## 2022-12-23 ENCOUNTER — Other Ambulatory Visit: Payer: Self-pay

## 2022-12-23 ENCOUNTER — Encounter: Payer: Self-pay | Admitting: Emergency Medicine

## 2022-12-23 DIAGNOSIS — K219 Gastro-esophageal reflux disease without esophagitis: Secondary | ICD-10-CM | POA: Diagnosis present

## 2022-12-23 DIAGNOSIS — D1803 Hemangioma of intra-abdominal structures: Secondary | ICD-10-CM | POA: Diagnosis present

## 2022-12-23 DIAGNOSIS — Z83438 Family history of other disorder of lipoprotein metabolism and other lipidemia: Secondary | ICD-10-CM

## 2022-12-23 DIAGNOSIS — K810 Acute cholecystitis: Secondary | ICD-10-CM | POA: Diagnosis not present

## 2022-12-23 DIAGNOSIS — Z841 Family history of disorders of kidney and ureter: Secondary | ICD-10-CM

## 2022-12-23 DIAGNOSIS — E876 Hypokalemia: Secondary | ICD-10-CM | POA: Diagnosis present

## 2022-12-23 DIAGNOSIS — Z8249 Family history of ischemic heart disease and other diseases of the circulatory system: Secondary | ICD-10-CM

## 2022-12-23 DIAGNOSIS — K660 Peritoneal adhesions (postprocedural) (postinfection): Secondary | ICD-10-CM | POA: Diagnosis present

## 2022-12-23 DIAGNOSIS — F17210 Nicotine dependence, cigarettes, uncomplicated: Secondary | ICD-10-CM | POA: Diagnosis present

## 2022-12-23 DIAGNOSIS — K8000 Calculus of gallbladder with acute cholecystitis without obstruction: Secondary | ICD-10-CM | POA: Diagnosis not present

## 2022-12-23 DIAGNOSIS — Z832 Family history of diseases of the blood and blood-forming organs and certain disorders involving the immune mechanism: Secondary | ICD-10-CM

## 2022-12-23 DIAGNOSIS — R7303 Prediabetes: Secondary | ICD-10-CM | POA: Diagnosis present

## 2022-12-23 DIAGNOSIS — Z5331 Laparoscopic surgical procedure converted to open procedure: Secondary | ICD-10-CM

## 2022-12-23 DIAGNOSIS — Z8616 Personal history of COVID-19: Secondary | ICD-10-CM

## 2022-12-23 DIAGNOSIS — E785 Hyperlipidemia, unspecified: Secondary | ICD-10-CM | POA: Diagnosis present

## 2022-12-23 DIAGNOSIS — Z8261 Family history of arthritis: Secondary | ICD-10-CM

## 2022-12-23 DIAGNOSIS — Z818 Family history of other mental and behavioral disorders: Secondary | ICD-10-CM

## 2022-12-23 DIAGNOSIS — Z825 Family history of asthma and other chronic lower respiratory diseases: Secondary | ICD-10-CM

## 2022-12-23 DIAGNOSIS — Z833 Family history of diabetes mellitus: Secondary | ICD-10-CM

## 2022-12-23 DIAGNOSIS — Z7985 Long-term (current) use of injectable non-insulin antidiabetic drugs: Secondary | ICD-10-CM

## 2022-12-23 DIAGNOSIS — Z79899 Other long term (current) drug therapy: Secondary | ICD-10-CM

## 2022-12-23 HISTORY — PX: LYSIS OF ADHESION: SHX5961

## 2022-12-23 HISTORY — PX: LAPAROTOMY: SHX154

## 2022-12-23 LAB — COMPREHENSIVE METABOLIC PANEL
ALT: 191 U/L — ABNORMAL HIGH (ref 0–44)
AST: 280 U/L — ABNORMAL HIGH (ref 15–41)
Albumin: 3.7 g/dL (ref 3.5–5.0)
Alkaline Phosphatase: 113 U/L (ref 38–126)
Anion gap: 6 (ref 5–15)
BUN: 15 mg/dL (ref 6–20)
CO2: 26 mmol/L (ref 22–32)
Calcium: 8.8 mg/dL — ABNORMAL LOW (ref 8.9–10.3)
Chloride: 106 mmol/L (ref 98–111)
Creatinine, Ser: 0.64 mg/dL (ref 0.44–1.00)
GFR, Estimated: >60 mL/min (ref >60)
Glucose, Bld: 104 mg/dL — ABNORMAL HIGH (ref 70–99)
Potassium: 3.2 mmol/L — ABNORMAL LOW (ref 3.5–5.1)
Sodium: 138 mmol/L (ref 135–145)
Total Bilirubin: 1.1 mg/dL (ref 0.3–1.2)
Total Protein: 7.2 g/dL (ref 6.5–8.1)

## 2022-12-23 LAB — URINALYSIS, ROUTINE W REFLEX MICROSCOPIC
Bilirubin Urine: NEGATIVE
Glucose, UA: NEGATIVE mg/dL
Hgb urine dipstick: NEGATIVE
Ketones, ur: NEGATIVE mg/dL
Leukocytes,Ua: NEGATIVE
Nitrite: NEGATIVE
Protein, ur: NEGATIVE mg/dL
Specific Gravity, Urine: 1.011 (ref 1.005–1.030)
pH: 8 (ref 5.0–8.0)

## 2022-12-23 LAB — CBC WITH DIFFERENTIAL/PLATELET
Abs Immature Granulocytes: 0.02 10*3/uL (ref 0.00–0.07)
Basophils Absolute: 0 10*3/uL (ref 0.0–0.1)
Basophils Relative: 1 %
Eosinophils Absolute: 0.1 10*3/uL (ref 0.0–0.5)
Eosinophils Relative: 1 %
HCT: 40.9 % (ref 36.0–46.0)
Hemoglobin: 14.5 g/dL (ref 12.0–15.0)
Immature Granulocytes: 0 %
Lymphocytes Relative: 33 %
Lymphs Abs: 1.7 10*3/uL (ref 0.7–4.0)
MCH: 30.5 pg (ref 26.0–34.0)
MCHC: 35.5 g/dL (ref 30.0–36.0)
MCV: 85.9 fL (ref 80.0–100.0)
Monocytes Absolute: 0.6 10*3/uL (ref 0.1–1.0)
Monocytes Relative: 12 %
Neutro Abs: 2.9 10*3/uL (ref 1.7–7.7)
Neutrophils Relative %: 53 %
Platelets: 278 10*3/uL (ref 150–400)
RBC: 4.76 MIL/uL (ref 3.87–5.11)
RDW: 13.7 % (ref 11.5–15.5)
Smear Review: NORMAL
WBC: 5.3 10*3/uL (ref 4.0–10.5)
nRBC: 0 % (ref 0.0–0.2)

## 2022-12-23 LAB — LIPASE, BLOOD: Lipase: 55 U/L — ABNORMAL HIGH (ref 11–51)

## 2022-12-23 LAB — CBG MONITORING, ED: Glucose-Capillary: 150 mg/dL — ABNORMAL HIGH (ref 70–99)

## 2022-12-23 SURGERY — CHOLECYSTECTOMY, ROBOT-ASSISTED, LAPAROSCOPIC
Anesthesia: General

## 2022-12-23 MED ORDER — BUPIVACAINE-EPINEPHRINE (PF) 0.25% -1:200000 IJ SOLN
INTRAMUSCULAR | Status: DC | PRN
  Administered 2022-12-23: 30 mL

## 2022-12-23 MED ORDER — ACETAMINOPHEN 500 MG PO TABS
1000.0000 mg | ORAL_TABLET | Freq: Four times a day (QID) | ORAL | Status: DC
  Administered 2022-12-23 – 2022-12-24 (×3): 1000 mg via ORAL
  Filled 2022-12-23 (×3): qty 2

## 2022-12-23 MED ORDER — BUPIVACAINE-EPINEPHRINE (PF) 0.25% -1:200000 IJ SOLN
INTRAMUSCULAR | Status: AC
  Filled 2022-12-23: qty 30

## 2022-12-23 MED ORDER — PHENYLEPHRINE HCL-NACL 20-0.9 MG/250ML-% IV SOLN
INTRAVENOUS | Status: DC | PRN
Start: 2022-12-23 — End: 2022-12-23
  Administered 2022-12-23: 20 ug/min via INTRAVENOUS

## 2022-12-23 MED ORDER — ONDANSETRON HCL 4 MG/2ML IJ SOLN
4.0000 mg | Freq: Once | INTRAMUSCULAR | Status: AC
  Administered 2022-12-23: 4 mg via INTRAVENOUS
  Filled 2022-12-23: qty 2

## 2022-12-23 MED ORDER — DROPERIDOL 2.5 MG/ML IJ SOLN: 0.6250 mg | Freq: Once | INTRAMUSCULAR | Status: DC | PRN

## 2022-12-23 MED ORDER — MORPHINE SULFATE (PF) 2 MG/ML IV SOLN
2.0000 mg | INTRAVENOUS | Status: DC | PRN
  Administered 2022-12-23 – 2022-12-24 (×3): 2 mg via INTRAVENOUS
  Filled 2022-12-23 (×3): qty 1

## 2022-12-23 MED ORDER — PROPOFOL 10 MG/ML IV BOLUS
INTRAVENOUS | Status: DC | PRN
  Administered 2022-12-23: 50 mg via INTRAVENOUS
  Administered 2022-12-23: 120 mg via INTRAVENOUS

## 2022-12-23 MED ORDER — ROCURONIUM BROMIDE 10 MG/ML (PF) SYRINGE
PREFILLED_SYRINGE | INTRAVENOUS | Status: AC
  Filled 2022-12-23: qty 10

## 2022-12-23 MED ORDER — INDOCYANINE GREEN 25 MG IV SOLR
INTRAVENOUS | Status: AC
  Filled 2022-12-23: qty 10

## 2022-12-23 MED ORDER — ONDANSETRON HCL 4 MG/2ML IJ SOLN
INTRAMUSCULAR | Status: DC | PRN
  Administered 2022-12-23: 4 mg via INTRAVENOUS

## 2022-12-23 MED ORDER — BUPIVACAINE LIPOSOME 1.3 % IJ SUSP
INTRAMUSCULAR | Status: AC
  Filled 2022-12-23: qty 20

## 2022-12-23 MED ORDER — FENTANYL CITRATE (PF) 100 MCG/2ML IJ SOLN
INTRAMUSCULAR | Status: AC
  Filled 2022-12-23: qty 2

## 2022-12-23 MED ORDER — SUGAMMADEX SODIUM 200 MG/2ML IV SOLN
INTRAVENOUS | Status: DC | PRN
  Administered 2022-12-23: 200 mg via INTRAVENOUS

## 2022-12-23 MED ORDER — DEXAMETHASONE SODIUM PHOSPHATE 10 MG/ML IJ SOLN
INTRAMUSCULAR | Status: DC | PRN
  Administered 2022-12-23: 10 mg via INTRAVENOUS

## 2022-12-23 MED ORDER — EPHEDRINE 5 MG/ML INJ
INTRAVENOUS | Status: AC
  Filled 2022-12-23: qty 5

## 2022-12-23 MED ORDER — MIDAZOLAM HCL 2 MG/2ML IJ SOLN
2.0000 mg | Freq: Once | INTRAMUSCULAR | Status: AC
  Administered 2022-12-23: 2 mg via INTRAVENOUS
  Filled 2022-12-23: qty 2

## 2022-12-23 MED ORDER — FENTANYL CITRATE (PF) 100 MCG/2ML IJ SOLN
INTRAMUSCULAR | Status: DC | PRN
  Administered 2022-12-23 (×2): 50 ug via INTRAVENOUS

## 2022-12-23 MED ORDER — ACETAMINOPHEN 10 MG/ML IV SOLN: 1000.0000 mg | Freq: Once | INTRAVENOUS | Status: DC | PRN

## 2022-12-23 MED ORDER — OXYCODONE HCL 5 MG/5ML PO SOLN: 5.0000 mg | Freq: Once | ORAL | Status: DC | PRN

## 2022-12-23 MED ORDER — HYDROMORPHONE HCL 1 MG/ML IJ SOLN
INTRAMUSCULAR | Status: DC | PRN
Start: 2022-12-23 — End: 2022-12-23
  Administered 2022-12-23: .5 mg via INTRAVENOUS

## 2022-12-23 MED ORDER — LACTATED RINGERS IV SOLN: INTRAVENOUS | Status: DC | PRN

## 2022-12-23 MED ORDER — KETOROLAC TROMETHAMINE 30 MG/ML IJ SOLN
INTRAMUSCULAR | Status: DC | PRN
  Administered 2022-12-23: 15 mg via INTRAVENOUS

## 2022-12-23 MED ORDER — EPHEDRINE SULFATE-NACL 50-0.9 MG/10ML-% IV SOSY
PREFILLED_SYRINGE | INTRAVENOUS | Status: DC | PRN
Start: 2022-12-23 — End: 2022-12-23
  Administered 2022-12-23: 10 mg via INTRAVENOUS
  Administered 2022-12-23 (×3): 5 mg via INTRAVENOUS

## 2022-12-23 MED ORDER — MORPHINE SULFATE (PF) 4 MG/ML IV SOLN
4.0000 mg | Freq: Once | INTRAVENOUS | Status: AC
  Administered 2022-12-23: 4 mg via INTRAVENOUS
  Filled 2022-12-23: qty 1

## 2022-12-23 MED ORDER — PHENYLEPHRINE 80 MCG/ML (10ML) SYRINGE FOR IV PUSH (FOR BLOOD PRESSURE SUPPORT)
PREFILLED_SYRINGE | INTRAVENOUS | Status: DC | PRN
  Administered 2022-12-23 (×2): 80 ug via INTRAVENOUS

## 2022-12-23 MED ORDER — FENTANYL CITRATE (PF) 100 MCG/2ML IJ SOLN: 25.0000 ug | INTRAMUSCULAR | Status: DC | PRN

## 2022-12-23 MED ORDER — 0.9 % SODIUM CHLORIDE (POUR BTL) OPTIME
TOPICAL | Status: DC | PRN
  Administered 2022-12-23: 500 mL

## 2022-12-23 MED ORDER — PROPOFOL 10 MG/ML IV BOLUS
INTRAVENOUS | Status: AC
  Filled 2022-12-23: qty 20

## 2022-12-23 MED ORDER — ROCURONIUM BROMIDE 100 MG/10ML IV SOLN
INTRAVENOUS | Status: DC | PRN
  Administered 2022-12-23: 50 mg via INTRAVENOUS
  Administered 2022-12-23: 20 mg via INTRAVENOUS
  Administered 2022-12-23 (×2): 10 mg via INTRAVENOUS

## 2022-12-23 MED ORDER — SODIUM CHLORIDE 0.9 % IV SOLN
2.0000 g | INTRAVENOUS | Status: DC
  Administered 2022-12-23: 2 g via INTRAVENOUS
  Filled 2022-12-23 (×2): qty 20

## 2022-12-23 MED ORDER — ONDANSETRON HCL 4 MG/2ML IJ SOLN: 4.0000 mg | Freq: Four times a day (QID) | INTRAMUSCULAR | Status: DC | PRN

## 2022-12-23 MED ORDER — ONDANSETRON HCL 4 MG/2ML IJ SOLN
INTRAMUSCULAR | Status: AC
  Filled 2022-12-23: qty 2

## 2022-12-23 MED ORDER — OXYCODONE HCL 5 MG PO TABS: 5.0000 mg | ORAL_TABLET | Freq: Once | ORAL | Status: DC | PRN

## 2022-12-23 MED ORDER — GADOBUTROL 1 MMOL/ML IV SOLN
6.0000 mL | Freq: Once | INTRAVENOUS | Status: AC | PRN
  Administered 2022-12-23: 6 mL via INTRAVENOUS

## 2022-12-23 MED ORDER — SODIUM CHLORIDE 0.9 % IV SOLN: INTRAVENOUS | Status: DC

## 2022-12-23 MED ORDER — HYDROMORPHONE HCL 1 MG/ML IJ SOLN
INTRAMUSCULAR | Status: AC
  Filled 2022-12-23: qty 1

## 2022-12-23 MED ORDER — CEFAZOLIN SODIUM 1 G IJ SOLR
INTRAMUSCULAR | Status: AC
  Filled 2022-12-23: qty 20

## 2022-12-23 MED ORDER — DEXAMETHASONE SODIUM PHOSPHATE 10 MG/ML IJ SOLN
INTRAMUSCULAR | Status: AC
  Filled 2022-12-23: qty 1

## 2022-12-23 MED ORDER — INDOCYANINE GREEN 25 MG IV SOLR
2.5000 mg | INTRAVENOUS | Status: AC
  Administered 2022-12-23: 2.5 mg via INTRAVENOUS
  Filled 2022-12-23: qty 10

## 2022-12-23 MED ORDER — PHENYLEPHRINE 80 MCG/ML (10ML) SYRINGE FOR IV PUSH (FOR BLOOD PRESSURE SUPPORT)
PREFILLED_SYRINGE | INTRAVENOUS | Status: AC
  Filled 2022-12-23: qty 10

## 2022-12-23 MED ORDER — LIDOCAINE HCL (CARDIAC) PF 100 MG/5ML IV SOSY
PREFILLED_SYRINGE | INTRAVENOUS | Status: DC | PRN
  Administered 2022-12-23: 100 mg via INTRAVENOUS

## 2022-12-23 MED ORDER — PHENYLEPHRINE HCL (PRESSORS) 10 MG/ML IV SOLN
INTRAVENOUS | Status: DC | PRN
Start: 2022-12-23 — End: 2022-12-23
  Administered 2022-12-23: 160 ug via INTRAVENOUS

## 2022-12-23 MED ORDER — SODIUM CHLORIDE 0.9 % IV BOLUS
1000.0000 mL | Freq: Once | INTRAVENOUS | Status: AC
  Administered 2022-12-23: 1000 mL via INTRAVENOUS

## 2022-12-23 MED ORDER — BUPIVACAINE LIPOSOME 1.3 % IJ SUSP
INTRAMUSCULAR | Status: DC | PRN
  Administered 2022-12-23: 20 mL

## 2022-12-23 MED ORDER — LIDOCAINE HCL (PF) 2 % IJ SOLN
INTRAMUSCULAR | Status: AC
  Filled 2022-12-23: qty 5

## 2022-12-23 MED ORDER — CEFAZOLIN SODIUM-DEXTROSE 2-3 GM-%(50ML) IV SOLR
INTRAVENOUS | Status: DC | PRN
Start: 2022-12-23 — End: 2022-12-23
  Administered 2022-12-23: 2 g via INTRAVENOUS

## 2022-12-23 MED ORDER — EPHEDRINE SULFATE (PRESSORS) 50 MG/ML IJ SOLN: INTRAMUSCULAR | Status: DC | PRN

## 2022-12-23 MED ORDER — OXYCODONE HCL 5 MG PO TABS: 5.0000 mg | ORAL_TABLET | ORAL | Status: DC | PRN

## 2022-12-23 MED ORDER — ONDANSETRON 4 MG PO TBDP: 4.0000 mg | ORAL_TABLET | Freq: Four times a day (QID) | ORAL | Status: DC | PRN

## 2022-12-23 SURGICAL SUPPLY — 58 items
ADH SKN CLS APL DERMABOND .7 (GAUZE/BANDAGES/DRESSINGS) ×2
BARRIER ADH SEPRAFILM 3INX5IN (MISCELLANEOUS) IMPLANT
BRR ADH 5X3 SEPRAFILM 2 SHT (MISCELLANEOUS) ×2
CANNULA REDUCER 12-8 DVNC XI (CANNULA) ×2 IMPLANT
CATH REDDICK CHOLANGI 4FR 50CM (CATHETERS) IMPLANT
CAUTERY HOOK MNPLR 1.6 DVNC XI (INSTRUMENTS) ×2 IMPLANT
CLIP LIGATING HEMO O LOK GREEN (MISCELLANEOUS) ×2 IMPLANT
DERMABOND ADVANCED .7 DNX12 (GAUZE/BANDAGES/DRESSINGS) ×2 IMPLANT
DISSECTOR BLUNT TIP ENDO 5MM (MISCELLANEOUS) IMPLANT
DRAPE ARM DVNC X/XI (DISPOSABLE) ×8 IMPLANT
DRAPE COLUMN DVNC XI (DISPOSABLE) ×2 IMPLANT
DRSG OPSITE POSTOP 3X4 (GAUZE/BANDAGES/DRESSINGS) IMPLANT
DRSG OPSITE POSTOP 4X6 (GAUZE/BANDAGES/DRESSINGS) IMPLANT
ELECT CAUTERY BLADE 6.4 (BLADE) ×2 IMPLANT
ELECT REM PT RETURN 9FT ADLT (ELECTROSURGICAL) ×2
ELECTRODE REM PT RTRN 9FT ADLT (ELECTROSURGICAL) ×2 IMPLANT
FORCEPS BPLR R/ABLATION 8 DVNC (INSTRUMENTS) ×2 IMPLANT
FORCEPS PROGRASP DVNC XI (FORCEP) ×2 IMPLANT
GLOVE BIO SURGEON STRL SZ7 (GLOVE) ×4 IMPLANT
GOWN STRL REUS W/ TWL LRG LVL3 (GOWN DISPOSABLE) ×8 IMPLANT
GOWN STRL REUS W/TWL LRG LVL3 (GOWN DISPOSABLE) ×8
IRRIGATION STRYKERFLOW (MISCELLANEOUS) IMPLANT
IRRIGATOR STRYKERFLOW (MISCELLANEOUS) ×2
IV CATH ANGIO 12GX3 LT BLUE (NEEDLE) IMPLANT
KIT PINK PAD W/HEAD ARE REST (MISCELLANEOUS) ×2 IMPLANT
KIT PINK PAD W/HEAD ARM REST (MISCELLANEOUS) ×2 IMPLANT
L-HOOK LAP DISP 36CM (ELECTROSURGICAL) ×2
LABEL OR SOLS (LABEL) ×2 IMPLANT
LHOOK LAP DISP 36CM (ELECTROSURGICAL) IMPLANT
MANIFOLD NEPTUNE II (INSTRUMENTS) ×2 IMPLANT
NDL HYPO 22X1.5 SAFETY MO (MISCELLANEOUS) ×2 IMPLANT
NEEDLE HYPO 22X1.5 SAFETY MO (MISCELLANEOUS) ×2 IMPLANT
NS IRRIG 500ML POUR BTL (IV SOLUTION) ×2 IMPLANT
OBTURATOR OPTICAL STND 8 DVNC (TROCAR) ×2
OBTURATOR OPTICALSTD 8 DVNC (TROCAR) ×2 IMPLANT
PACK LAP CHOLECYSTECTOMY (MISCELLANEOUS) ×2 IMPLANT
PENCIL SMOKE EVACUATOR (MISCELLANEOUS) ×2 IMPLANT
SEAL UNIV 5-12 XI (MISCELLANEOUS) ×8 IMPLANT
SET TUBE SMOKE EVAC HIGH FLOW (TUBING) ×2 IMPLANT
SOL ELECTROSURG ANTI STICK (MISCELLANEOUS) ×2
SOLUTION ELECTROSURG ANTI STCK (MISCELLANEOUS) ×2 IMPLANT
SPIKE FLUID TRANSFER (MISCELLANEOUS) ×2 IMPLANT
SPONGE T-LAP 18X18 ~~LOC~~+RFID (SPONGE) ×2 IMPLANT
SPONGE T-LAP 4X18 ~~LOC~~+RFID (SPONGE) IMPLANT
STAPLER SKIN PROX 35W (STAPLE) IMPLANT
STOPCOCK 3WAY MALE LL (IV SETS) IMPLANT
SUT MNCRL AB 4-0 PS2 18 (SUTURE) ×2 IMPLANT
SUT PDS AB 0 CT1 27 (SUTURE) IMPLANT
SUT VICRYL 0 UR6 27IN ABS (SUTURE) ×4 IMPLANT
SYR 20ML LL LF (SYRINGE) IMPLANT
SYS BAG RETRIEVAL 10MM (BASKET) ×2
SYS TROCAR 1.5-3 SLV ABD GEL (ENDOMECHANICALS) ×2
SYSTEM BAG RETRIEVAL 10MM (BASKET) ×2 IMPLANT
SYSTEM TROCR 1.5-3 SLV ABD GEL (ENDOMECHANICALS) IMPLANT
TRAP FLUID SMOKE EVACUATOR (MISCELLANEOUS) ×2 IMPLANT
TRAY FOLEY MTR SLVR 16FR STAT (SET/KITS/TRAYS/PACK) IMPLANT
WATER STERILE IRR 3000ML UROMA (IV SOLUTION) IMPLANT
WATER STERILE IRR 500ML POUR (IV SOLUTION) ×2 IMPLANT

## 2022-12-23 NOTE — Anesthesia Preprocedure Evaluation (Addendum)
Anesthesia Evaluation  Patient identified by MRN, date of birth, ID band Patient awake    Reviewed: Allergy & Precautions, H&P , NPO status , Patient's Chart, lab work & pertinent test results  Airway Mallampati: II  TM Distance: >3 FB Neck ROM: full    Dental no notable dental hx.    Pulmonary Current Smoker and Patient abstained from smoking.   Pulmonary exam normal        Cardiovascular negative cardio ROS Normal cardiovascular exam     Neuro/Psych Failed back surgical syndrome  Neuromuscular disease  negative psych ROS   GI/Hepatic Neg liver ROS,GERD  ,,Acute Cholecystitis   Endo/Other  diabetes, Type 2    Renal/GU      Musculoskeletal   Abdominal Normal abdominal exam  (+)   Peds  Hematology negative hematology ROS (+)   Anesthesia Other Findings Past Medical History: No date: Chicken pox No date: COVID-19     Comment:  03/2020, 03/2021 1996: Family history of adverse reaction to anesthesia     Comment:  mother did not wake up No date: GERD (gastroesophageal reflux disease) No date: History of kidney stones     Comment:  passed 3 , litrotrispy No date: Hyperlipidemia No date: Liver lesion     Comment:  hemangioma vs FNH vs adenoma noted MRI 2008 (Dr. Servando Snare)  No date: Pneumonia No date: Pre-diabetes No date: Prediabetes No date: Trigger finger of left thumb  Past Surgical History: No date: ABDOMINAL HYSTERECTOMY     Comment:  2006/2007 for endometriosis  No date: BACK SURGERY     Comment:  L4/5 discectomy 09/18/2015 Dr. Yevette Edwards  No date: breast reduction     Comment:  1994 06/07/2015: EXTRACORPOREAL SHOCK WAVE LITHOTRIPSY; Right     Comment:  Procedure: EXTRACORPOREAL SHOCK WAVE LITHOTRIPSY (ESWL);              Surgeon: Orson Ape, MD;  Location: [facility] ORS;                Service: Urology;  Laterality: Right; 07/19/2015: RADIOLOGY WITH ANESTHESIA; N/A     Comment:  Procedure: MRI LUMBER SPIN  WITHOUT;  Surgeon: Medication              Radiologist, MD;  Location: [facility] OR;  Service: Radiology;                Laterality: N/A; 09/22/2017: RADIOLOGY WITH ANESTHESIA; N/A     Comment:  Procedure: MRI LUMBAR SPINE WITHOUT CONTRAST;  Surgeon:               Radiologist, Medication, MD;  Location: [facility] OR;  Service:               Radiology;  Laterality: N/A; 04/09/2021: RADIOLOGY WITH ANESTHESIA; N/A     Comment:  Procedure: MRI LIVER WITH AND WITHOUT CONTRAST WITH               ANESTHESIA;  Surgeon: Radiologist, Medication, MD;                Location: [facility] OR;  Service: Radiology;  Laterality: N/A; 2007 2008: SHOULDER OPEN ROTATOR CUFF REPAIR; Bilateral     Comment:  right 2007, left 2008  02/18/2016: TRIGGER FINGER RELEASE; Left     Comment:  Procedure: LEFT TRIGGER THUMB RELEASE;  Surgeon: Mack Hook, MD;  Location: [facility];  Service: Orthopedics;  Laterality: Left; No date: WISDOM TOOTH EXTRACTION  BMI    Body Mass Index: 24.46 kg/m      Reproductive/Obstetrics negative OB ROS                             Anesthesia Physical Anesthesia Plan  ASA: 2  Anesthesia Plan: General ETT   Post-op Pain Management: Toradol IV (intra-op)* and Tylenol PO (pre-op)*   Induction: Intravenous  PONV Risk Score and Plan: 3 and Ondansetron, Dexamethasone and Midazolam  Airway Management Planned: Oral ETT  Additional Equipment:   Intra-op Plan:   Post-operative Plan: Extubation in OR  Informed Consent: I have reviewed the patients History and Physical, chart, labs and discussed the procedure including the risks, benefits and alternatives for the proposed anesthesia with the patient or authorized representative who has indicated his/her understanding and acceptance.     Dental Advisory Given  Plan Discussed with: CRNA and Surgeon  Anesthesia Plan Comments:         Anesthesia Quick Evaluation

## 2022-12-23 NOTE — ED Provider Notes (Signed)
[facility] Provider Note    Event Date/Time   First MD Initiated Contact with Patient 12/23/22 0448     (approximate)   History   Abdominal Pain   HPI  Lori Lang is a 52 y.o. female who presents to the emergency department today because of concerns for abdominal pain.  Patient states that she first had an episode of this pain a little over a month ago.  Is located primarily in the epigastric region.  It is associated with nausea.  She states that since that first episode a month ago she has had a couple others and then had another 1 tonight.  She continues to have the pain at the time my exam.  She has not noticed any recent bloody stool.  No fevers although she does have some chills.  Is on Ozempic.  Denies any immediate family with gallbladder disease.     Physical Exam   Triage Vital Signs: ED Triage Vitals  Encounter Vitals Group     BP 12/23/22 0455 135/77     Systolic BP Percentile --      Diastolic BP Percentile --      Pulse Rate 12/23/22 0455 68     Resp 12/23/22 0455 16     Temp 12/23/22 0455 97.9 F (36.6 C)     Temp Source 12/23/22 0455 Oral     SpO2 12/23/22 0455 98 %     Weight 12/23/22 0444 147 lb (66.7 kg)     Height 12/23/22 0444 5\' 5"  (1.651 m)     Head Circumference --      Peak Flow --      Pain Score 12/23/22 0444 8     Pain Loc --      Pain Education --      Exclude from Growth Chart --     Most recent vital signs: Vitals:   12/23/22 0455  BP: 135/77  Pulse: 68  Resp: 16  Temp: 97.9 F (36.6 C)  SpO2: 98%   General: Awake, alert, oriented.  CV:  Good peripheral perfusion. Regular rate and rhythm. Resp:  Normal effort. Lungs clear. Abd:  No distention. Tender to palpation in the epigastric region.   ED Results / Procedures / Treatments   Labs (all labs ordered are listed, but only abnormal results are displayed) Labs Reviewed  CBC WITH DIFFERENTIAL/PLATELET  COMPREHENSIVE METABOLIC PANEL  LIPASE,  BLOOD  URINALYSIS, ROUTINE W REFLEX MICROSCOPIC     EKG  I, Phineas Semen, attending physician, personally viewed and interpreted this EKG  EKG Time: 0451 Rate: 70 Rhythm: sinus rhythm Axis: left axis deviation Intervals: qtc 423 QRS: narrow ST changes: no st elevation Impression: abnormal ekg   RADIOLOGY I independently interpreted and visualized the RUQ [place]. My interpretation: Gallstones with gallbladder wall thickening Radiology interpretation:  IMPRESSION:  1. Cholelithiasis with gallbladder wall thickening and positive  sonographic Murphy's sign, findings worrisome for acute  cholecystitis.  2. Two hyperechoic lesions in the liver, the larger measuring 4.6  cm. Further evaluation with nonemergent MRI without and with  contrast is recommended.  3. Hepatic steatosis.  4. Upper limit of normal common bile duct diameter. No intrahepatic  biliary ductal dilatation.     PROCEDURES:  Critical Care performed: No   MEDICATIONS ORDERED IN ED: Medications - No data to display   IMPRESSION / MDM / ASSESSMENT AND PLAN / ED COURSE  I reviewed the triage vital signs and the nursing notes.  Differential diagnosis includes, but is not limited to, GERD, pancreatitis, hepatitis, gallbladder disease, medication side effect.  Patient's presentation is most consistent with acute presentation with potential threat to life or bodily function.   Patient presents to the emergency department today because of concerns for epigastric pain.  On exam patient is tender in the epigastric region.  Will check blood work and obtain right upper quadrant ultrasound.  Will give medication to help with symptoms.  Blood work without leukocytosis. Slight elevation of lipase, AST and ALT.  [place] is concerning for acute cholecystitis. Discussed with Dr. Everlene Farrier who will evaluate the patient. Requested MRCP.     FINAL CLINICAL IMPRESSION(S) / ED DIAGNOSES   Final  diagnoses:  Acute cholecystitis      Note:  This document was prepared using Dragon voice recognition software and may include unintentional dictation errors.    Phineas Semen, MD 12/23/22 0730

## 2022-12-23 NOTE — ED Notes (Signed)
US at bedside

## 2022-12-23 NOTE — ED Notes (Addendum)
Pt c/o feeling vaginal itching after antibiotics, reports it is subsiding now. Will notify [place] PA

## 2022-12-23 NOTE — Op Note (Signed)
Laparotomy with enterolysis With Cholecystectomy  Pre-operative Diagnosis: Acute cholecystitis  Post-operative Diagnosis: same  Surgeon: Sterling Big, MD FACS  Anesthesia: Gen. with endotracheal tube  Findings: Extensive intra-abdominal adhesions from the small bowel to the abd wall, , omentum to the abd wall. Hostile abdomen, adhesion seemed potentially related to PID Please note that the lysis of adhesion with laparotomy was the major procedure taking about 90 minutes of total operative time Acute Cholecystitis   Estimated Blood Loss: 50cc       Specimens: Gallbladder           Complications: none   Procedure Details  The patient was seen again in the Holding Room. The benefits, complications, treatment options, and expected outcomes were discussed with the patient. The risks of bleeding, infection, recurrence of symptoms, failure to resolve symptoms, bile duct damage, bile duct leak, retained common bile duct stone, bowel injury, any of which could require further surgery and/or ERCP, stent, or papillotomy were reviewed with the patient. The likelihood of improving the patient's symptoms with return to their baseline status is good.  The patient and/or family concurred with the proposed plan, giving informed consent.  The patient was taken to Operating Room, identified  and the procedure verified . A Time Out was held and the above information confirmed.  Prior to the induction of general anesthesia, antibiotic prophylaxis was administered. VTE prophylaxis was in place. General endotracheal anesthesia was then administered and tolerated well. After the induction, the abdomen was prepped with Chloraprep and draped in the sterile fashion. The patient was positioned in the supine position.  Cut down technique was used by creating an infraumbilical incision. The was extensive adhesion from the omentum to the abdominal wall, I was able to have a tiny window superiorly. I then was able to  place a port on the LUQ under direct visualization. Pneumoperitoneum was obtained, I noticed extensive adhesions that were dense and I did not feel safe taken them down laparoscopically. I did some of them laparoscopically but became evidence that doing a mini laparotomy to perform a safe operation and avoid any bowel injuries was the right call.  I extended my periumbilical incision along with the fascial incision about 7 cms as a midline mini laparotomy. Extensice LOA performed sharply w scissors. I was then able to restore the anatomy on the upper and mid abdomen . I examined the small bowel and I did not see any bowel injuries. At this time I felt I had a good window of dissection to the the cholecystectomy robotically.  I had to use a gelport in the mini laparotomy incision.  Pneumoperitoneum was then created with CO2 and tolerated well without any adverse changes in the patient's vital signs.  Three 8-mm ports were placed under direct vision.   The patient was positioned  in reverse Trendelenburg, robot was brought to the surgical field and docked in the standard fashion.  We made sure all the instrumentation was kept indirect view at all times and that there were no collision between the arms. I scrubbed out and went to the console.  The gallbladder was identified, the fundus grasped and retracted cephalad. Adhesions were lysed bluntly. The infundibulum was grasped and retracted laterally, exposing the peritoneum overlying the triangle of Calot. This was then divided and exposed in a blunt fashion. An extended critical view of the cystic duct and cystic artery was obtained.  The cystic duct was clearly identified and bluntly dissected.   Artery and duct were  double clipped and divided. Using ICG cholangiography we visualize the cystic duct and CBD w/o evidence of bile injuries. The gallbladder was taken from the gallbladder fossa in a retrograde fashion with the electrocautery.  Hemostasis was  achieved with the electrocautery. nspection of the right upper quadrant was performed. No bleeding, bile duct injury or leak, or bowel injury was noted. Robotic instruments and robotic arms were undocked in the standard fashion.  I scrubbed back in.  The gallbladder was removed and placed in an Endocatch bag.   Pneumoperitoneum was released.  The gelport was removed and TAP bilateral blocks done with exparel, the laparotomy fascia was closed using 0 pds using small bite technique . Skin incisions were closed with staples. The patient was then extubated and brought to the recovery room in stable condition. Sponge, lap, and needle counts were correct at closure and at the conclusion of the case.  Please note that this case was very complex and required extensive lysis of adhesion and laparotomy , mod 22 applied.      Sterling Big, MD, FACS

## 2022-12-23 NOTE — Anesthesia Procedure Notes (Signed)
Procedure Name: Intubation Date/Time: 12/23/2022 2:52 PM  Performed by: Morene Crocker, CRNAPre-anesthesia Checklist: Patient identified, Patient being monitored, Timeout performed, Emergency Drugs available and Suction available Patient Re-evaluated:Patient Re-evaluated prior to induction Oxygen Delivery Method: Circle system utilized Preoxygenation: Pre-oxygenation with 100% oxygen Induction Type: IV induction Ventilation: Mask ventilation without difficulty Laryngoscope Size: 3 and McGraph Grade View: Grade I Tube type: Oral Tube size: 7.0 mm Number of attempts: 1 Airway Equipment and Method: Stylet Placement Confirmation: ETT inserted through vocal cords under direct vision, positive ETCO2 and breath sounds checked- equal and bilateral Secured at: 22 cm Tube secured with: Tape Dental Injury: Teeth and Oropharynx as per pre-operative assessment  Comments: Smooth, atraumatic intubation. No complications noted

## 2022-12-23 NOTE — ED Notes (Signed)
Ambulatory to toilet

## 2022-12-23 NOTE — ED Notes (Signed)
Pt to mri 

## 2022-12-23 NOTE — ED Notes (Signed)
Pt given belongings bag to place clothes in

## 2022-12-23 NOTE — Discharge Instructions (Signed)

## 2022-12-23 NOTE — ED Notes (Signed)
Pt getting screened by MRI

## 2022-12-23 NOTE — H&P (Signed)
[facility] SURGICAL HISTORY & PHYSICAL (cpt [id number])  HISTORY OF PRESENT ILLNESS (HPI):  52 y.o. female presented to [facility] ED this morning for abdominal pain. Patient reports she initially noticed pain in her epigastrium and through to her back on Sunday night. This persisted into Monday night and this morning which prompted her presentation. This is a deep aching pain. Nothing seemed to trigger this. She does have nausea and emesis associated. She has had similar episodes of pain three times now over the last month. These typical resolve after a few episodes of emesis. No fever, chills, CP, SOB, bowel changes, urinary changes, nor juandice. No know history of gallstones until today. Previous abdominal surgeries positive for abdominal hysterectomy. Work up in the ED revealed a normal WBC at 5.3K, Hgb to 14.5, sCr - 0.64, hypokalemia to 3.2, AST 280, ALT 191, bilirubin 1.1, lipase 55. She did have RUQ US showing cholelithiasis with gallbladder wall changes. She did get MRCP given LFT changes which was without choledocholithiasis. Of note, she has stable, known, hemangioma on the liver.   General surgery is consulted by emergency medicine physician Dr Grayson Goodman, MD for evaluation and management of possible cholecystitis.   PAST MEDICAL HISTORY (PMH):  Past Medical History:  Diagnosis Date   Chicken pox    COVID-19    03/2020, 03/2021   Family history of adverse reaction to anesthesia 1996   mother did not wake up   GERD (gastroesophageal reflux disease)    History of kidney stones    passed 3 , litrotrispy   Hyperlipidemia    Liver lesion    hemangioma vs FNH vs adenoma noted MRI 2008 (Dr. Wohl)    Pneumonia    Pre-diabetes    Prediabetes    Trigger finger of left thumb     Reviewed. Otherwise negative.   PAST SURGICAL HISTORY (PSH):  Past Surgical History:  Procedure Laterality Date   ABDOMINAL HYSTERECTOMY     2006/2007 for endometriosis    BACK SURGERY     L4/5  discectomy 09/18/2015 Dr. Dumonski    breast reduction     19 94   EXTRACORPOREAL SHOCK WAVE LITHOTRIPSY Right 06/07/2015   Procedure: EXTRACORPOREAL SHOCK WAVE LITHOTRIPSY (ESWL);  Surgeon: Orson Ape, MD;  Location: [facility] ORS;  Service: Urology;  Laterality: Right;   RADIOLOGY WITH ANESTHESIA N/A 07/19/2015   Procedure: MRI LUMBER SPIN WITHOUT;  Surgeon: Medication Radiologist, MD;  Location: [facility] OR;  Service: Radiology;  Laterality: N/A;   RADIOLOGY WITH ANESTHESIA N/A 09/22/2017   Procedure: MRI LUMBAR SPINE WITHOUT CONTRAST;  Surgeon: Radiologist, Medication, MD;  Location: [facility] OR;  Service: Radiology;  Laterality: N/A;   RADIOLOGY WITH ANESTHESIA N/A 04/09/2021   Procedure: MRI LIVER WITH AND WITHOUT CONTRAST WITH ANESTHESIA;  Surgeon: Radiologist, Medication, MD;  Location: [facility] OR;  Service: Radiology;  Laterality: N/A;   SHOULDER OPEN ROTATOR CUFF REPAIR Bilateral 2007 2008   right 2007, left 2008    TRIGGER FINGER RELEASE Left 02/18/2016   Procedure: LEFT TRIGGER THUMB RELEASE;  Surgeon: Mack Hook, MD;  Location: [facility];  Service: Orthopedics;  Laterality: Left;   WISDOM TOOTH EXTRACTION      Reviewed. Otherwise negative.   MEDICATIONS:  Prior to Admission medications   Medication Sig Start Date End Date Taking? Authorizing Provider  Semaglutide, 2 MG/DOSE, 8 MG/3ML SOPN Inject 2 mg as directed once a week. 05/07/22   Bethanie Dicker, NP  simvastatin (ZOCOR) 10 MG tablet Take 1 tablet (10  mg total) by mouth at bedtime. 05/07/22   Bethanie Dicker, NP  Vitamin D, Ergocalciferol, (DRISDOL) 1.25 MG (50000 UNIT) CAPS capsule Take 1 capsule (50,000 Units total) by mouth every 7 (seven) days. 05/09/22   Bethanie Dicker, NP     ALLERGIES:  No Known Allergies   SOCIAL HISTORY:  Social History   Socioeconomic History   Marital status: Married    Spouse name: Not on file   Number of children: Not on file   Years of education: Not on file   Highest education level: Not on  file  Occupational History   Not on file  Tobacco Use   Smoking status: Every Day    Current packs/day: 1.00    Average packs/day: 1 pack/day for 30.0 years (30.0 ttl pk-yrs)    Types: Cigarettes   Smokeless tobacco: Never   Tobacco comments:    0.75PPD 05/01/2021  Vaping Use   Vaping status: Never Used  Substance and Sexual Activity   Alcohol use: No   Drug use: No   Sexual activity: Yes  Other Topics Concern   Not on file  Social History Narrative   Married    2 kids son going to army   Daughter going to [employer] fall 2019    Owns guns    Wears seat belt    Safe in relationship    Social Determinants of Corporate investment banker Strain: Not on file  Food Insecurity: Not on file  Transportation Needs: Not on file  Physical Activity: Not on file  Stress: Not on file  Social Connections: Not on file  Intimate Partner Violence: Not on file     FAMILY HISTORY:  Family History  Problem Relation Age of Onset   Arthritis Mother    Early death Mother    Heart disease Mother        MVP with repair died from anestheisa    Hyperlipidemia Mother    Hypertension Mother    Kidney disease Mother    Pulmonary embolism Mother    Early death Father    Diabetes Father    Hyperlipidemia Father    Heart disease Father    Hypertension Father    Kidney disease Father    Mitral valve prolapse Father    Deep vein thrombosis Father    Pulmonary embolism Father        caused him to die   Diabetes Mellitus I Father    Kidney failure Father        s/p transplant   Diabetes Maternal Grandmother    Heart disease Maternal Grandmother    Heart disease Maternal Grandfather    Early death Paternal Grandmother    Diabetes Paternal Grandmother    Cancer Paternal Grandfather        prostate    Asthma Daughter    Depression Daughter    Bipolar disorder Daughter     Otherwise negative.   REVIEW OF SYSTEMS:  Review of Systems  Constitutional:  Negative for chills and fever.   Respiratory:  Negative for cough and shortness of breath.   Cardiovascular:  Negative for chest pain and palpitations.  Gastrointestinal:  Positive for abdominal pain, nausea and vomiting. Negative for constipation and diarrhea.  Genitourinary:  Negative for dysuria and urgency.  All other systems reviewed and are negative.   VITAL SIGNS:  Temp:  [97.9 F (36.6 C)] 97.9 F (36.6 C) (10/01 0455) Pulse Rate:  [66-72] 66 (10/01 0830) Resp:  [16-20] 20 (  10/01 0830) BP: (110-135)/(64-77) 112/64 (10/01 0830) SpO2:  [98 %-100 %] 100 % (10/01 0830) Weight:  [66.7 kg] 66.7 kg (10/01 0444)     Height: 5\' 5"  (165.1 cm) Weight: 66.7 kg BMI (Calculated): 24.46   PHYSICAL EXAM:  Physical Exam Vitals and nursing note reviewed. Exam conducted with a chaperone present.  Constitutional:      General: She is not in acute distress.    Appearance: She is well-developed and normal weight. She is not ill-appearing.     Comments: Sitting up in bed; NAD  HENT:     Head: Normocephalic and atraumatic.  Eyes:     General: No scleral icterus.    Extraocular Movements: Extraocular movements intact.  Cardiovascular:     Rate and Rhythm: Normal rate.     Heart sounds: Normal heart sounds. No murmur heard. Pulmonary:     Effort: Pulmonary effort is normal. No respiratory distress.  Abdominal:     General: Abdomen is flat. There is no distension.     Palpations: Abdomen is soft.     Tenderness: There is no abdominal tenderness. There is no guarding or rebound.     Hernia: No hernia is present.     Comments: Abdomen is soft, she is non-tender at time of my examination, non-distended, no rebound/guarding. Murphy's Sign is negative at this time.   Genitourinary:    Comments: Deferred Skin:    General: Skin is warm and dry.     Coloration: Skin is not jaundiced.  Neurological:     General: No focal deficit present.     Mental Status: She is alert and oriented to person, place, and time.  Psychiatric:         Mood and Affect: Mood normal.        Behavior: Behavior normal.     INTAKE/OUTPUT:  This shift: No intake/output data recorded.  Last 2 shifts: @IOLAST2SHIFTS @  Labs:     Latest Ref Rng & Units 12/23/2022    5:03 AM 11/06/2022   12:10 AM 05/07/2022   10:44 AM  CBC  WBC 4.0 - 10.5 K/uL 5.3  6.0  5.3   Hemoglobin 12.0 - 15.0 g/dL 16.1  09.6  04.5   Hematocrit 36.0 - 46.0 % 40.9  42.6  42.7   Platelets 150 - 400 K/uL 278  331  308.0       Latest Ref Rng & Units 12/23/2022    5:03 AM 11/06/2022   12:10 AM 05/07/2022   10:44 AM  CMP  Glucose 70 - 99 mg/dL 409  95  75   BUN 6 - 20 mg/dL 15  18  15    Creatinine 0.44 - 1.00 mg/dL 8.11  9.14  7.82   Sodium 135 - 145 mmol/L 138  138  139   Potassium 3.5 - 5.1 mmol/L 3.2  3.5  3.8   Chloride 98 - 111 mmol/L 106  103  107   CO2 22 - 32 mmol/L 26  24  25    Calcium 8.9 - 10.3 mg/dL 8.8  9.1  9.3   Total Protein 6.5 - 8.1 g/dL 7.2  7.5  6.5   Total Bilirubin 0.3 - 1.2 mg/dL 1.1  0.9  0.5   Alkaline Phos 38 - 126 U/L 113  88  77   AST 15 - 41 U/L 280  132  11   ALT 0 - 44 U/L 191  65  10      Imaging studies:  RUQ [place] (12/23/2022) personally reviewed showing cholelithiasis, and radiologist report reviewed below:  IMPRESSION: 1. Cholelithiasis with gallbladder wall thickening and positive sonographic Murphy's sign, findings worrisome for acute cholecystitis. 2. Two hyperechoic lesions in the liver, the larger measuring 4.6 cm. Further evaluation with nonemergent MRI without and with contrast is recommended. 3. Hepatic steatosis. 4. Upper limit of normal common bile duct diameter. No intrahepatic biliary ductal dilatation.   MRCP (12/23/2022) personally reviewed without evidence of choledocholithiasis, and radiologist report reviewed below:  IMPRESSION: Cholelithiasis with gallbladder wall thickening and edema, consistent with acute cholecystitis.   No evidence of biliary ductal dilatation or choledocholithiasis.    Benign hepatic hemangiomas in the liver dome, largest measuring 4.2 cm.   Assessment/Plan: (ICD-10's: K81.0) 52 y.o. female with likely recurrent symptomatic cholelithiasis vs acute cholecystitis   - Had a length discussion with the patient regarding her presenting symptoms, work up, and recommendations. I do think she at least has symptomatic cholelithiasis given numerous episodes in the last few week. Right now, her symptoms are improved and WBC is normal. She was initially hesitant to consider surgery. I did offer potentially managing this as an outpatient and proceeding with interval cholecystectomy in the ambulatory setting. Discussed risks, benefits, and alternatives to these two options. After lengthy discussion,. She elected to proceed with cholecystectomy this admission.    - Will place in observation; She wishes to go home post-operatively  - Will plan on robotic assisted laparoscopic cholecystectomy with Dr Everlene Farrier pending OR/Anesthesia availability  - All risks, benefits, and alternatives to above procedure(s) were discussed with the patient, all of her questions were answered to her expressed satisfaction, patient expresses she wishes to proceed, and informed consent was obtained.   - NPO + IVF Resuscitation  - IV Abx (Rocephin) - ICG on call to OR - Monitor abdominal examination   - Pain control prn; antiemetics prn  All of the above findings and recommendations were discussed with the patient, and all of her questions were answered to herexpressed satisfaction.  -- Lynden Oxford, PA-C [facility] 12/23/2022, 10:21 AM M-F: 7am - 4pm

## 2022-12-23 NOTE — ED Triage Notes (Signed)
Patient ambulatory to triage with steady gait, without difficulty or distress noted; pt st x 2wks having mid upper abd radiating into back accomp by N/V; recently taking ozempic

## 2022-12-23 NOTE — Transfer of Care (Signed)
Immediate Anesthesia Transfer of Care Note  Patient: Lori Lang  Procedure(s) Performed: XI ROBOTIC ASSISTED LAPAROSCOPIC CHOLECYSTECTOMY INDOCYANINE GREEN FLUORESCENCE IMAGING (ICG) LAPAROTOMY LYSIS OF ADHESION  Patient Location: PACU  Anesthesia Type:General  Level of Consciousness: drowsy  Airway & Oxygen Therapy: Patient Spontanous Breathing and Patient connected to face mask oxygen  Post-op Assessment: Report given to RN and Post -op Vital signs reviewed and stable  Post vital signs: Reviewed  Last Vitals:  Vitals Value Taken Time  BP 122/59 12/23/22 1817  Temp 94F   Pulse 78 12/23/22 1821  Resp 18 12/23/22 1821  SpO2 100 % 12/23/22 1821  Vitals shown include unfiled device data.  Last Pain:  Vitals:   12/23/22 1408  TempSrc: Oral  PainSc: 2          Complications: No notable events documented.

## 2022-12-24 ENCOUNTER — Encounter: Payer: Self-pay | Admitting: Surgery

## 2022-12-24 DIAGNOSIS — K8000 Calculus of gallbladder with acute cholecystitis without obstruction: Secondary | ICD-10-CM | POA: Diagnosis present

## 2022-12-24 DIAGNOSIS — Z833 Family history of diabetes mellitus: Secondary | ICD-10-CM | POA: Diagnosis not present

## 2022-12-24 DIAGNOSIS — Z832 Family history of diseases of the blood and blood-forming organs and certain disorders involving the immune mechanism: Secondary | ICD-10-CM | POA: Diagnosis not present

## 2022-12-24 DIAGNOSIS — R7303 Prediabetes: Secondary | ICD-10-CM | POA: Diagnosis present

## 2022-12-24 DIAGNOSIS — Z825 Family history of asthma and other chronic lower respiratory diseases: Secondary | ICD-10-CM | POA: Diagnosis not present

## 2022-12-24 DIAGNOSIS — Z5331 Laparoscopic surgical procedure converted to open procedure: Secondary | ICD-10-CM | POA: Diagnosis not present

## 2022-12-24 DIAGNOSIS — K810 Acute cholecystitis: Secondary | ICD-10-CM | POA: Diagnosis present

## 2022-12-24 DIAGNOSIS — Z8616 Personal history of COVID-19: Secondary | ICD-10-CM | POA: Diagnosis not present

## 2022-12-24 DIAGNOSIS — K660 Peritoneal adhesions (postprocedural) (postinfection): Secondary | ICD-10-CM | POA: Diagnosis present

## 2022-12-24 DIAGNOSIS — Z841 Family history of disorders of kidney and ureter: Secondary | ICD-10-CM | POA: Diagnosis not present

## 2022-12-24 DIAGNOSIS — Z7985 Long-term (current) use of injectable non-insulin antidiabetic drugs: Secondary | ICD-10-CM | POA: Diagnosis not present

## 2022-12-24 DIAGNOSIS — F17210 Nicotine dependence, cigarettes, uncomplicated: Secondary | ICD-10-CM | POA: Diagnosis present

## 2022-12-24 DIAGNOSIS — E785 Hyperlipidemia, unspecified: Secondary | ICD-10-CM | POA: Diagnosis present

## 2022-12-24 DIAGNOSIS — Z8249 Family history of ischemic heart disease and other diseases of the circulatory system: Secondary | ICD-10-CM | POA: Diagnosis not present

## 2022-12-24 DIAGNOSIS — K219 Gastro-esophageal reflux disease without esophagitis: Secondary | ICD-10-CM | POA: Diagnosis present

## 2022-12-24 DIAGNOSIS — Z79899 Other long term (current) drug therapy: Secondary | ICD-10-CM | POA: Diagnosis not present

## 2022-12-24 DIAGNOSIS — Z8261 Family history of arthritis: Secondary | ICD-10-CM | POA: Diagnosis not present

## 2022-12-24 DIAGNOSIS — E876 Hypokalemia: Secondary | ICD-10-CM | POA: Diagnosis present

## 2022-12-24 DIAGNOSIS — D1803 Hemangioma of intra-abdominal structures: Secondary | ICD-10-CM | POA: Diagnosis present

## 2022-12-24 DIAGNOSIS — Z818 Family history of other mental and behavioral disorders: Secondary | ICD-10-CM | POA: Diagnosis not present

## 2022-12-24 DIAGNOSIS — Z83438 Family history of other disorder of lipoprotein metabolism and other lipidemia: Secondary | ICD-10-CM | POA: Diagnosis not present

## 2022-12-24 MED ORDER — IBUPROFEN 600 MG PO TABS: 600.0000 mg | ORAL_TABLET | Freq: Four times a day (QID) | ORAL | 0 refills | Status: DC | PRN

## 2022-12-24 MED ORDER — OXYCODONE HCL 5 MG PO TABS: 5.0000 mg | ORAL_TABLET | Freq: Four times a day (QID) | ORAL | 0 refills | Status: DC | PRN

## 2022-12-24 MED ORDER — AMOXICILLIN-POT CLAVULANATE 875-125 MG PO TABS: 1.0000 | ORAL_TABLET | Freq: Two times a day (BID) | ORAL | 0 refills | Status: AC

## 2022-12-24 NOTE — Discharge Summary (Signed)
[facility] SURGICAL DISCHARGE SUMMARY   Patient ID: Lori Lang MRN: [id number] DOB/AGE: [birth year] 54 y.o.  Admit date: 12/23/2022 Discharge date: 12/24/2022  Discharge Diagnoses Patient Active Problem List   Diagnosis Date Noted   Cholecystitis, acute 12/23/2022   Acute cholecystitis 12/23/2022    Consultants None  Procedures 12/23/2022:  Robotic assisted laparoscopic cholecystectomy  HPI: 52 y.o. female presented to [facility] ED this morning for abdominal pain. Patient reports she initially noticed pain in her epigastrium and through to her back on Sunday night. This persisted into Monday night and this morning which prompted her presentation. This is a deep aching pain. Nothing seemed to trigger this. She does have nausea and emesis associated. She has had similar episodes of pain three times now over the last month. These typical resolve after a few episodes of emesis. No fever, chills, CP, SOB, bowel changes, urinary changes, nor juandice. No know history of gallstones until today. Previous abdominal surgeries positive for abdominal hysterectomy. Work up in the ED revealed a normal WBC at 5.3K, Hgb to 14.5, sCr - 0.64, hypokalemia to 3.2, AST 280, ALT 191, bilirubin 1.1, lipase 55. She did have RUQ US showing cholelithiasis with gallbladder wall changes. She did get MRCP given LFT changes which was without choledocholithiasis. Of note, she has stable, known, hemangioma on the liver.   Hospital Course: Informed consent was obtained and documented, and patient underwent robotic assisted laparoscopic cholecystectomy which did require extensive lysis of adhesions (Dr Everlene Farrier, 12/23/2022).  Post-operatively, patient did remarkably well. Advancement of patient's diet and ambulation were well-tolerated. The remainder of patient's hospital course was essentially unremarkable, and discharge planning was initiated accordingly with patient safely able to be discharged home with  appropriate discharge instructions, antibiotics (Augmentin x6 days to complete 7 total out of abundance of caution), pain control, and outpatient follow-up after all of her questions were answered to her expressed satisfaction.   Discharge Condition: Good    Physical Examination:  Constitutional: Well appearing female; NAD Pulmonary: Normal effort, no respiratory distress Gastrointestinal: Soft, incisional soreness, non-distended, no rebound/guarding Skin: Laparoscopic incisions are CDI with staples and honeycomb   Allergies as of 12/24/2022   No Known Allergies      Medication List     TAKE these medications    amoxicillin-clavulanate 875-125 MG tablet Commonly known as: AUGMENTIN Take 1 tablet by mouth 2 (two) times daily for 6 days.   fluocinonide gel 0.05 % Commonly known as: LIDEX Apply 1 Application topically 4 (four) times daily.   ibuprofen 600 MG tablet Commonly known as: ADVIL Take 1 tablet (600 mg total) by mouth every 6 (six) hours as needed.   oxyCODONE 5 MG immediate release tablet Commonly known as: Oxy IR/ROXICODONE Take 1 tablet (5 mg total) by mouth every 6 (six) hours as needed for severe pain or breakthrough pain.   Semaglutide (2 MG/DOSE) 8 MG/3ML Sopn Inject 2 mg as directed once a week.   simvastatin 10 MG tablet Commonly known as: ZOCOR Take 1 tablet (10 mg total) by mouth at bedtime.   Vitamin D (Ergocalciferol) 1.25 MG (50000 UNIT) Caps capsule Commonly known as: DRISDOL Take 1 capsule (50,000 Units total) by mouth every 7 (seven) days.          Follow-up Information     Donovan Kail, PA-C. Go on 01/07/2023.   Specialty: Physician Assistant Why: Go to appointment on 01/07/2023 at 245 PM Contact information: [address] [place] [address] [phone]  Time spent on discharge management including discussion of hospital course, clinical condition, outpatient instructions,  prescriptions, and follow up with the patient and members of the medical team: >30 minutes  -- Lynden Oxford , PA-C [facility]  12/24/2022, 8:58 AM [phone] M-F: 7am - 4pm

## 2022-12-24 NOTE — TOC CM/SW Note (Signed)
Transition of Care [facility]) - Inpatient Brief Assessment   Patient Details  Name: Lori Lang MRN: [id number] Date of Birth: 1970/05/20  [facility] CM/SW Contact:    Chapman Fitch, RN Phone Number: 12/24/2022, 9:08 AM   Clinical Narrative:   Transition of Care [facility]) Screening Note   Patient Details  Name: Lori Lang Date of Birth: Dec 18, 1970   Transition of Care [facility]) CM/SW Contact:    Chapman Fitch, RN Phone Number: 12/24/2022, 9:08 AM    Transition of Care Department [facility]) has reviewed patient and no TOC needs have been identified at this time. We will continue to monitor patient advancement through interdisciplinary progression rounds. If new patient transition needs arise, please place a TOC consult.    Transition of Care Asessment: Insurance and Status: Insurance coverage has been reviewed       Prior/Current Home Services: No current home services Social Determinants of Health Reivew: SDOH reviewed no interventions necessary Readmission risk has been reviewed: Yes Transition of care needs: no transition of care needs at this time

## 2022-12-24 NOTE — Progress Notes (Signed)
Discharge instructions were reviewed with patient. Questions were encourage and answered. IV was removed. Belongings were collected by patient.

## 2022-12-24 NOTE — Progress Notes (Signed)
Pt requested to stop the NS IV infusion. Pt stated that she no longer needs IV NS as she plans on discharging today. Pt education provided - Pt verbalized understanding.

## 2022-12-25 LAB — SURGICAL PATHOLOGY

## 2022-12-29 NOTE — Anesthesia Postprocedure Evaluation (Signed)
Anesthesia Post Note  Patient: JAMAL HASKIN  Procedure(s) Performed: XI ROBOTIC ASSISTED LAPAROSCOPIC CHOLECYSTECTOMY INDOCYANINE GREEN FLUORESCENCE IMAGING (ICG) LAPAROTOMY LYSIS OF ADHESION  Patient location during evaluation: PACU Anesthesia Type: General Level of consciousness: awake and alert Pain management: pain level controlled Vital Signs Assessment: post-procedure vital signs reviewed and stable Respiratory status: spontaneous breathing, nonlabored ventilation, respiratory function stable and patient connected to nasal cannula oxygen Cardiovascular status: blood pressure returned to baseline and stable Postop Assessment: no apparent nausea or vomiting Anesthetic complications: no   No notable events documented.   Last Vitals:  Vitals:   12/24/22 0115 12/24/22 0506  BP: 122/72 112/67  Pulse: 77 79  Resp: 18 16  Temp: 36.8 C 37.3 C  SpO2:  91%    Last Pain:  Vitals:   12/24/22 0730  TempSrc:   PainSc: 4                  Yevette Edwards

## 2022-12-31 ENCOUNTER — Other Ambulatory Visit: Payer: Self-pay

## 2022-12-31 DIAGNOSIS — R112 Nausea with vomiting, unspecified: Secondary | ICD-10-CM | POA: Diagnosis not present

## 2022-12-31 DIAGNOSIS — Z5321 Procedure and treatment not carried out due to patient leaving prior to being seen by health care provider: Secondary | ICD-10-CM | POA: Diagnosis not present

## 2022-12-31 DIAGNOSIS — R1013 Epigastric pain: Secondary | ICD-10-CM | POA: Insufficient documentation

## 2022-12-31 LAB — CBC
HCT: 48.4 % — ABNORMAL HIGH (ref 36.0–46.0)
Hemoglobin: 16.5 g/dL — ABNORMAL HIGH (ref 12.0–15.0)
MCH: 30.4 pg (ref 26.0–34.0)
MCHC: 34.1 g/dL (ref 30.0–36.0)
MCV: 89.1 fL (ref 80.0–100.0)
Platelets: 431 10*3/uL — ABNORMAL HIGH (ref 150–400)
RBC: 5.43 MIL/uL — ABNORMAL HIGH (ref 3.87–5.11)
RDW: 13.8 % (ref 11.5–15.5)
WBC: 7.7 10*3/uL (ref 4.0–10.5)
nRBC: 0 % (ref 0.0–0.2)

## 2022-12-31 LAB — COMPREHENSIVE METABOLIC PANEL
ALT: 38 U/L (ref 0–44)
AST: 28 U/L (ref 15–41)
Albumin: 4.3 g/dL (ref 3.5–5.0)
Alkaline Phosphatase: 95 U/L (ref 38–126)
Anion gap: 11 (ref 5–15)
BUN: 15 mg/dL (ref 6–20)
CO2: 25 mmol/L (ref 22–32)
Calcium: 9.1 mg/dL (ref 8.9–10.3)
Chloride: 100 mmol/L (ref 98–111)
Creatinine, Ser: 0.76 mg/dL (ref 0.44–1.00)
GFR, Estimated: >60 mL/min (ref >60)
Glucose, Bld: 112 mg/dL — ABNORMAL HIGH (ref 70–99)
Potassium: 4.2 mmol/L (ref 3.5–5.1)
Sodium: 136 mmol/L (ref 135–145)
Total Bilirubin: 1.3 mg/dL — ABNORMAL HIGH (ref 0.3–1.2)
Total Protein: 9.2 g/dL — ABNORMAL HIGH (ref 6.5–8.1)

## 2022-12-31 LAB — TROPONIN I (HIGH SENSITIVITY): Troponin I (High Sensitivity): 15 ng/L (ref <18)

## 2022-12-31 LAB — LIPASE, BLOOD: Lipase: 39 U/L (ref 11–51)

## 2022-12-31 MED ORDER — FENTANYL CITRATE PF 50 MCG/ML IJ SOSY
50.0000 ug | PREFILLED_SYRINGE | INTRAMUSCULAR | Status: DC | PRN
  Administered 2022-12-31: 50 ug via INTRAVENOUS
  Filled 2022-12-31: qty 1

## 2022-12-31 MED ORDER — ONDANSETRON HCL 4 MG/2ML IJ SOLN
4.0000 mg | Freq: Once | INTRAMUSCULAR | Status: AC
  Administered 2022-12-31: 4 mg via INTRAVENOUS
  Filled 2022-12-31: qty 2

## 2022-12-31 MED ORDER — ONDANSETRON 4 MG PO TBDP: 4.0000 mg | ORAL_TABLET | Freq: Once | ORAL | Status: DC | PRN

## 2022-12-31 NOTE — ED Triage Notes (Addendum)
Pt reports central and epigastric abd pain. Reports 7 days post op from cholectomy. Pt endorses pain radiating into her chest. Also reports associated nausea but unable to vomit. Pt alert and oriented on arrival. Breathing unlabored. Ambulatory to triage.   Pt now vomiting in triage.

## 2023-01-01 ENCOUNTER — Emergency Department
Admission: EM | Admit: 2023-01-01 | Discharge: 2023-01-01 | Discharge status: Against Medical Advice | Payer: Commercial Managed Care - PPO | Attending: Emergency Medicine | Admitting: Emergency Medicine

## 2023-01-02 ENCOUNTER — Encounter: Payer: Self-pay | Admitting: Nurse Practitioner

## 2023-01-02 ENCOUNTER — Ambulatory Visit: Payer: Commercial Managed Care - PPO | Admitting: Nurse Practitioner

## 2023-01-02 VITALS — BP 96/62 | Temp 97.9°F | Ht 65.0 in | Wt 143.2 lb

## 2023-01-02 DIAGNOSIS — R1084 Generalized abdominal pain: Secondary | ICD-10-CM

## 2023-01-02 DIAGNOSIS — Z9049 Acquired absence of other specified parts of digestive tract: Secondary | ICD-10-CM

## 2023-01-02 NOTE — Progress Notes (Signed)
Bethanie Dicker, NP-C Phone: [phone]  Lori Lang is a 52 y.o. female who presents today for abdominal pain.   Patient evaluated in ED on 12/23/2022 for abdominal pain. She was found to have acute cholecystitis. She under went a laparoscopic cholecystectomy that same day. She presents today continuing to have abdominal pain. She reports her pain is worse now than it was prior to her surgery. Her pain mostly occurs after eating, depending on what she has eaten. Her pain is worse after eating anything fried. She has been able to eat more bland foods without pain such as- [place], bananas, rice, baked potatoes and steak. She reports a sharp, cramping like pain in the middle of her abdomen. She has had some diarrhea. Denies nausea and vomiting. Denies fevers. She is scheduled to follow up with her surgeon next week.   Social History   Tobacco Use  Smoking Status Every Day   Current packs/day: 1.00   Average packs/day: 1 pack/day for 30.0 years (30.0 ttl pk-yrs)   Types: Cigarettes  Smokeless Tobacco Never  Tobacco Comments   0.75PPD 05/01/2021    Current Outpatient Medications on File Prior to Visit  Medication Sig Dispense Refill   fluocinonide gel (LIDEX) 0.05 % Apply 1 Application topically 4 (four) times daily.     ibuprofen (ADVIL) 600 MG tablet Take 1 tablet (600 mg total) by mouth every 6 (six) hours as needed. 30 tablet 0   Semaglutide, 2 MG/DOSE, 8 MG/3ML SOPN Inject 2 mg as directed once a week. 3 mL 5   Vitamin D, Ergocalciferol, (DRISDOL) 1.25 MG (50000 UNIT) CAPS capsule Take 1 capsule (50,000 Units total) by mouth every 7 (seven) days. 13 capsule 1   No current facility-administered medications on file prior to visit.    ROS see history of present illness  Objective  Physical Exam Vitals:   01/02/23 0855  BP: 96/62  Temp: 97.9 F (36.6 C)    BP Readings from Last 3 Encounters:  01/07/23 111/75  01/02/23 96/62  12/31/22 (!) 144/68   Wt Readings from Last 3  Encounters:  01/07/23 143 lb (64.9 kg)  01/02/23 143 lb 3.2 oz (65 kg)  12/31/22 140 lb (63.5 kg)    Physical Exam Constitutional:      General: She is not in acute distress.    Appearance: Normal appearance.  HENT:     Head: Normocephalic.  Cardiovascular:     Rate and Rhythm: Normal rate and regular rhythm.     Heart sounds: Normal heart sounds.  Pulmonary:     Effort: Pulmonary effort is normal.     Breath sounds: Normal breath sounds.  Abdominal:     General: Abdomen is flat. Bowel sounds are normal.     Palpations: Abdomen is soft.     Tenderness: There is generalized abdominal tenderness.       Comments: Incisions with staples present. Healing well. No drainage or erythema present.   Skin:    General: Skin is warm and dry.  Neurological:     General: No focal deficit present.     Mental Status: She is alert.  Psychiatric:        Mood and Affect: Mood normal.        Behavior: Behavior normal.      Assessment/Plan: Please see individual problem list.  S/P laparoscopic cholecystectomy Assessment & Plan: Surgery on 12/23/2022. Healing well. She will follow up with her surgeon next week as scheduled.    Generalized abdominal pain  Assessment & Plan: Worse since having gallbladder removed and with eating fried foods. Likely due to diet. Advised bland diet and to avoid fried and high fat foods. Eating plan and information provided to patient. She will follow up with her surgeon next week. Return precautions given to patient.     Return in about 4 months (around 05/11/2023) for Annual Exam.   Bethanie Dicker, NP-C [facility] - [employer]

## 2023-01-07 ENCOUNTER — Ambulatory Visit: Payer: Commercial Managed Care - PPO | Admitting: Physician Assistant

## 2023-01-07 ENCOUNTER — Encounter: Payer: Self-pay | Admitting: Physician Assistant

## 2023-01-07 VITALS — BP 111/75 | HR 62 | Temp 97.9°F | Ht 65.0 in | Wt 143.0 lb

## 2023-01-07 DIAGNOSIS — K810 Acute cholecystitis: Secondary | ICD-10-CM

## 2023-01-07 DIAGNOSIS — Z09 Encounter for follow-up examination after completed treatment for conditions other than malignant neoplasm: Secondary | ICD-10-CM

## 2023-01-07 NOTE — Patient Instructions (Signed)

## 2023-01-07 NOTE — Progress Notes (Unsigned)
[facility] POST-OP OFFICE VISIT  01/07/2023  HPI: Lori Lang is a 52 y.o. female 14 days s/p robotic assisted laparoscopic cholecystectomy and extensive LOA with Dr Everlene Farrier   Overall doing okay  She is having central abdominal pain, radiate to the back - this comes and goes intermittently throughout the day She has scant nausea intermittently as well No fever, chills, emesis She is scared to eat but is without significant diarrhea  Incisions are healing well   She did have labs drawn on 10/09 which I have reviewed: Wbc 7.7K, Hgb to 16.5, sCr - 0.76, AST 28, ALT 38, Bilirubin 1.3  Vital signs: BP 111/75 (BP Location: Left Arm, Patient Position: Sitting, Cuff Size: Small)   Pulse 62   Temp 97.9 F (36.6 C) (Oral)   Ht 5\' 5"  (1.651 m)   Wt 143 lb (64.9 kg)   SpO2 100%   BMI 23.80 kg/m    Physical Exam: Constitutional: Well appearing female, NAD Abdomen: Soft, abdominal soreness worse at umbilicus, non-distended, no rebound/guarding Skin: Laparoscopic incisions are healing well, no erythema or drainage   Assessment/Plan: This is a 52 y.o. female 14 days s/p robotic assisted laparoscopic cholecystectomy and extensive LOA with Dr Everlene Farrier    Cherlynn Polo removed; steri-strips placed  - Reviewed dietary recommendations; limit fatty foods. Okay to gradually resume diet  - Pain control prn; Suspect some of her pain is related to the extent of LOA required intra-operatively. No evidence of infection, retained stone, bilie leak   - Reviewed wound care recommendation  - Reviewed lifting restrictions; 4 weeks total  - Reviewed surgical pathology; CCC  - I will see her again on 10/30 for recheck; She understands to call with questions/concerns  -- Lynden Oxford, PA-C [facility] 01/07/2023, 3:10 PM M-F: 7am - 4pm

## 2023-01-13 DIAGNOSIS — R1084 Generalized abdominal pain: Secondary | ICD-10-CM | POA: Insufficient documentation

## 2023-01-13 NOTE — Assessment & Plan Note (Signed)
Worse since having gallbladder removed and with eating fried foods. Likely due to diet. Advised bland diet and to avoid fried and high fat foods. Eating plan and information provided to patient. She will follow up with her surgeon next week. Return precautions given to patient.

## 2023-01-13 NOTE — Assessment & Plan Note (Signed)
Surgery on 12/23/2022. Healing well. She will follow up with her surgeon next week as scheduled.

## 2023-01-21 ENCOUNTER — Encounter: Payer: Self-pay | Admitting: Physician Assistant

## 2023-01-21 ENCOUNTER — Ambulatory Visit: Payer: Commercial Managed Care - PPO | Admitting: Physician Assistant

## 2023-01-21 VITALS — BP 117/73 | HR 76 | Temp 98.5°F | Ht 65.0 in | Wt 142.0 lb

## 2023-01-21 DIAGNOSIS — K810 Acute cholecystitis: Secondary | ICD-10-CM

## 2023-01-21 DIAGNOSIS — Z09 Encounter for follow-up examination after completed treatment for conditions other than malignant neoplasm: Secondary | ICD-10-CM

## 2023-01-21 MED ORDER — CYCLOBENZAPRINE HCL 5 MG PO TABS: 5.0000 mg | ORAL_TABLET | Freq: Every day | ORAL | 0 refills | Status: DC

## 2023-01-21 NOTE — Patient Instructions (Signed)

## 2023-01-21 NOTE — Progress Notes (Unsigned)
Doing better GI symptoms resolved Soreness, midline incision - worse at night  Exam reassuring  Trial at bedtime Flexeril  F/U PRN

## 2023-02-05 ENCOUNTER — Other Ambulatory Visit (HOSPITAL_COMMUNITY): Payer: Self-pay

## 2023-02-05 ENCOUNTER — Telehealth: Payer: Self-pay

## 2023-02-05 NOTE — Telephone Encounter (Signed)
Pharmacy Patient Advocate Encounter  Received notification from [employer] that Prior Authorization for [facility] has been APPROVED from 02/05/23 to 02/04/24. Ran test claim, Copay is $24.99. This test claim was processed through [facility]- copay amounts may vary at other pharmacies due to pharmacy/plan contracts, or as the patient moves through the different stages of their insurance plan.   PA #/Case ID/Reference #: [id number]

## 2023-02-05 NOTE — Telephone Encounter (Signed)
Pharmacy Patient Advocate Encounter   Received notification from Fax that prior authorization for OZEMPIC 2 MG/DOSE (8 MG/3 ML) is required/requested.   Insurance verification completed.   The patient is insured through  [employer]  .   Per test claim: PA required; PA submitted to above mentioned insurance via Prom[employer] PA Key/confirmation #/EOC [id number] Status is pending

## 2023-02-17 ENCOUNTER — Telehealth: Payer: Self-pay

## 2023-02-27 NOTE — Telephone Encounter (Signed)
Error

## 2023-04-08 ENCOUNTER — Telehealth: Payer: Self-pay

## 2023-04-08 ENCOUNTER — Other Ambulatory Visit: Payer: Self-pay

## 2023-04-08 ENCOUNTER — Encounter: Payer: Self-pay | Admitting: Nurse Practitioner

## 2023-04-08 MED ORDER — SIMVASTATIN 10 MG PO TABS: 10.0000 mg | ORAL_TABLET | Freq: Every day | ORAL | 2 refills | Status: DC

## 2023-04-08 NOTE — Telephone Encounter (Signed)
 PA needed for Ozempic

## 2023-04-09 ENCOUNTER — Telehealth: Payer: Self-pay

## 2023-04-09 ENCOUNTER — Other Ambulatory Visit: Payer: Self-pay

## 2023-04-09 MED ORDER — OZEMPIC (2 MG/DOSE) 8 MG/3ML ~~LOC~~ SOPN
2.0000 mg | PEN_INJECTOR | SUBCUTANEOUS | 5 refills | Status: DC
  Filled 2023-04-09: qty 3, 28d supply, fill #0

## 2023-04-09 MED ORDER — ERGOCALCIFEROL 1.25 MG (50000 UT) PO CAPS
1.0000 | ORAL_CAPSULE | ORAL | 1 refills | Status: DC
  Filled 2023-04-09: qty 12, 84d supply, fill #0

## 2023-04-09 NOTE — Telephone Encounter (Signed)
Medication Samples have been provided to the patient.  Drug name: Ozempic       Strength: 0.25        Qty: 1 box  LOT: [id number]  Exp.Date: 08-21-24  Dosing instructions: inject 0.25 mg into the skin once weekly.   The patient has been instructed regarding the correct time, dose, and frequency of taking this medication, including desired effects and most common side effects.   Lori Lang 8:59 AM 04/09/2023

## 2023-04-10 ENCOUNTER — Ambulatory Visit: Payer: Commercial Managed Care - PPO | Admitting: Nurse Practitioner

## 2023-04-13 ENCOUNTER — Ambulatory Visit (INDEPENDENT_AMBULATORY_CARE_PROVIDER_SITE_OTHER): Payer: Commercial Managed Care - PPO | Admitting: Nurse Practitioner

## 2023-04-13 ENCOUNTER — Other Ambulatory Visit: Payer: Self-pay

## 2023-04-13 VITALS — BP 118/76 | HR 69 | Temp 97.9°F | Ht 65.0 in | Wt 152.4 lb

## 2023-04-13 DIAGNOSIS — R2232 Localized swelling, mass and lump, left upper limb: Secondary | ICD-10-CM | POA: Insufficient documentation

## 2023-04-13 DIAGNOSIS — E785 Hyperlipidemia, unspecified: Secondary | ICD-10-CM

## 2023-04-13 DIAGNOSIS — Z720 Tobacco use: Secondary | ICD-10-CM

## 2023-04-13 DIAGNOSIS — E119 Type 2 diabetes mellitus without complications: Secondary | ICD-10-CM

## 2023-04-13 DIAGNOSIS — Z7985 Long-term (current) use of injectable non-insulin antidiabetic drugs: Secondary | ICD-10-CM

## 2023-04-13 DIAGNOSIS — K760 Fatty (change of) liver, not elsewhere classified: Secondary | ICD-10-CM

## 2023-04-13 DIAGNOSIS — Z1329 Encounter for screening for other suspected endocrine disorder: Secondary | ICD-10-CM

## 2023-04-13 DIAGNOSIS — E559 Vitamin D deficiency, unspecified: Secondary | ICD-10-CM

## 2023-04-13 LAB — LIPID PANEL
Cholesterol: 189 mg/dL (ref 0–200)
HDL: 60.4 mg/dL (ref >39.00)
LDL Cholesterol: 110 mg/dL — ABNORMAL HIGH (ref 0–99)
NonHDL: 128.31
Total CHOL/HDL Ratio: 3
Triglycerides: 93 mg/dL (ref 0.0–149.0)
VLDL: 18.6 mg/dL (ref 0.0–40.0)

## 2023-04-13 LAB — COMPREHENSIVE METABOLIC PANEL
ALT: 29 U/L (ref 0–35)
AST: 19 U/L (ref 0–37)
Albumin: 4 g/dL (ref 3.5–5.2)
Alkaline Phosphatase: 86 U/L (ref 39–117)
BUN: 18 mg/dL (ref 6–23)
CO2: 26 meq/L (ref 19–32)
Calcium: 9.1 mg/dL (ref 8.4–10.5)
Chloride: 107 meq/L (ref 96–112)
Creatinine, Ser: 0.7 mg/dL (ref 0.40–1.20)
GFR: 98.24 mL/min (ref >60.00)
Glucose, Bld: 80 mg/dL (ref 70–99)
Potassium: 3.8 meq/L (ref 3.5–5.1)
Sodium: 141 meq/L (ref 135–145)
Total Bilirubin: 0.4 mg/dL (ref 0.2–1.2)
Total Protein: 6.9 g/dL (ref 6.0–8.3)

## 2023-04-13 LAB — CBC WITH DIFFERENTIAL/PLATELET
Basophils Absolute: 0 10*3/uL (ref 0.0–0.1)
Basophils Relative: 0.6 % (ref 0.0–3.0)
Eosinophils Absolute: 0.1 10*3/uL (ref 0.0–0.7)
Eosinophils Relative: 2.1 % (ref 0.0–5.0)
HCT: 44.2 % (ref 36.0–46.0)
Hemoglobin: 14.6 g/dL (ref 12.0–15.0)
Lymphocytes Relative: 46.3 % — ABNORMAL HIGH (ref 12.0–46.0)
Lymphs Abs: 2 10*3/uL (ref 0.7–4.0)
MCHC: 33.1 g/dL (ref 30.0–36.0)
MCV: 91.9 fL (ref 78.0–100.0)
Monocytes Absolute: 0.7 10*3/uL (ref 0.1–1.0)
Monocytes Relative: 15.4 % — ABNORMAL HIGH (ref 3.0–12.0)
Neutro Abs: 1.6 10*3/uL (ref 1.4–7.7)
Neutrophils Relative %: 35.6 % — ABNORMAL LOW (ref 43.0–77.0)
Platelets: 285 10*3/uL (ref 150.0–400.0)
RBC: 4.81 Mil/uL (ref 3.87–5.11)
RDW: 14.7 % (ref 11.5–15.5)
WBC: 4.4 10*3/uL (ref 4.0–10.5)

## 2023-04-13 LAB — TSH: TSH: 1.2 u[IU]/mL (ref 0.35–5.50)

## 2023-04-13 LAB — HEMOGLOBIN A1C: Hgb A1c MFr Bld: 5.8 % (ref 4.6–6.5)

## 2023-04-13 LAB — VITAMIN D 25 HYDROXY (VIT D DEFICIENCY, FRACTURES): VITD: 9.02 ng/mL — ABNORMAL LOW (ref 30.00–100.00)

## 2023-04-13 MED ORDER — TIRZEPATIDE 2.5 MG/0.5ML ~~LOC~~ SOAJ
2.5000 mg | SUBCUTANEOUS | 0 refills | Status: DC
Start: 2023-04-13 — End: 2023-06-03
  Filled 2023-04-13 – 2023-04-17 (×4): qty 2, 28d supply, fill #0

## 2023-04-13 MED ORDER — TIRZEPATIDE 5 MG/0.5ML ~~LOC~~ SOAJ
5.0000 mg | SUBCUTANEOUS | 0 refills | Status: DC
Start: 2023-04-13 — End: 2023-06-03
  Filled 2023-04-13 – 2023-05-11 (×5): qty 2, 28d supply, fill #0

## 2023-04-13 NOTE — Telephone Encounter (Signed)
Pt did not receive ozempic sample due to provider switching pt to [employer] (for insurance purposes)

## 2023-04-13 NOTE — Assessment & Plan Note (Addendum)
Due to insurance coverage issues, Ozempic was discontinued about a month ago, leading to increased thirst. We will switch to [facility], starting at 2.5mg  for 4 weeks. We will check A1C today. Counseled on the risk of pancreatitis and gallbladder disease. Discussed the risk of nausea. Advised to discontinue the [facility] and contact us immediately if they develop abdominal pain. If they develop excessive nausea they will contact us right away. I discussed that medullary thyroid cancer has been seen in rats studies. The patient confirmed no personal or family history of thyroid cancer, parathyroid cancer, or adrenal gland cancer. Discussed that we thus far have not seen medullary thyroid cancer result from use of this type of medication in humans. Advised to monitor the thyroid area and contact us for any lumps, swelling, trouble swallowing, or any other changes in this area.  Discussed goal weight loss of 1 to 2 pounds a week while on this medication. Discussed the importance of healthy diet, exercise and lifestyle modifications even with this medication.

## 2023-04-13 NOTE — Assessment & Plan Note (Signed)
Tender, blister-like lesions have developed on the hand, with no other joint pain. Patient concerned about RA. We will check autoimmune labs today. Advise monitoring lesions and report if she spreads, changes or worsens.

## 2023-04-13 NOTE — Assessment & Plan Note (Signed)
No longer taking vitamin D supplement. We will check vitamin D level today.

## 2023-04-13 NOTE — Progress Notes (Signed)
Bethanie Dicker, NP-C Phone: [phone]  Lori Lang is a 53 y.o. female who presents today to discuss medications.  Discussed the use of AI scribe software for clinical note transcription with the patient, who gave verbal consent to proceed.  History of Present Illness   The patient, with a history of diabetes and hypercholesterolemia, presents with concerns about her medication regimen due to a recent change in insurance. She reports that she has been unable to obtain her Ozempic for about a month due to high cost, and has been taking it inconsistently prior to that. She has been without the medication for approximately three weeks at the time of the consultation. The patient also reports increased thirst recently, but denies any episodes of hypoglycemia.  In addition, the patient has noticed new skin changes on her hand, described as blister-like lesions that were initially red and burning. The lesions have improved but are still present and occasionally painful. The patient denies any other joint pain or symptoms consistent with rheumatoid arthritis, but does report intermittent knee pain.  The patient also acknowledges a significant smoking habit, which she is not currently interested in quitting despite understanding the associated health risks. She denies any respiratory symptoms or other health issues related to smoking.      Social History   Tobacco Use  Smoking Status Every Day   Current packs/day: 1.00   Average packs/day: 1 pack/day for 30.0 years (30.0 ttl pk-yrs)   Types: Cigarettes  Smokeless Tobacco Never  Tobacco Comments   0.75PPD 05/01/2021    Current Outpatient Medications on File Prior to Visit  Medication Sig Dispense Refill   cyclobenzaprine (FLEXERIL) 5 MG tablet Take 1 tablet (5 mg total) by mouth at bedtime. 10 tablet 0   fluocinonide gel (LIDEX) 0.05 % Apply 1 Application topically 4 (four) times daily.     ibuprofen (ADVIL) 600 MG tablet Take 1 tablet (600  mg total) by mouth every 6 (six) hours as needed. 30 tablet 0   simvastatin (ZOCOR) 10 MG tablet Take 1 tablet (10 mg total) by mouth at bedtime. 90 tablet 2   No current facility-administered medications on file prior to visit.     ROS see history of present illness  Objective  Physical Exam Vitals:   04/13/23 0809  BP: 118/76  Pulse: 69  Temp: 97.9 F (36.6 C)  SpO2: 99%    BP Readings from Last 3 Encounters:  04/13/23 118/76  01/21/23 117/73  01/07/23 111/75   Wt Readings from Last 3 Encounters:  04/13/23 152 lb 6.4 oz (69.1 kg)  01/21/23 142 lb (64.4 kg)  01/07/23 143 lb (64.9 kg)    Physical Exam Constitutional:      General: She is not in acute distress.    Appearance: Normal appearance.  HENT:     Head: Normocephalic.  Cardiovascular:     Rate and Rhythm: Normal rate and regular rhythm.     Heart sounds: Normal heart sounds.  Pulmonary:     Effort: Pulmonary effort is normal.     Breath sounds: Normal breath sounds.  Skin:    General: Skin is warm and dry.     Findings: Lesion present.     Comments: Left hand- ring and middle finger with small blister like nodule at proximal nail fold. No warmth or erythema present. Mild tenderness.   Neurological:     General: No focal deficit present.     Mental Status: She is alert.  Psychiatric:  Mood and Affect: Mood normal.        Behavior: Behavior normal.    Assessment/Plan: Please see individual problem list.  Type 2 diabetes mellitus without complication, without long-term current use of insulin (HCC) Assessment & Plan: Due to insurance coverage issues, Ozempic was discontinued about a month ago, leading to increased thirst. We will switch to [employer], starting at 2.5mg  for 4 weeks. We will check A1C today. Counseled on the risk of pancreatitis and gallbladder disease. Discussed the risk of nausea. Advised to discontinue the [facility] and contact us immediately if they develop abdominal pain. If they  develop excessive nausea they will contact us right away. I discussed that medullary thyroid cancer has been seen in rats studies. The patient confirmed no personal or family history of thyroid cancer, parathyroid cancer, or adrenal gland cancer. Discussed that we thus far have not seen medullary thyroid cancer result from use of this type of medication in humans. Advised to monitor the thyroid area and contact us for any lumps, swelling, trouble swallowing, or any other changes in this area.  Discussed goal weight loss of 1 to 2 pounds a week while on this medication. Discussed the importance of healthy diet, exercise and lifestyle modifications even with this medication.   Orders: -     CBC with Differential/Platelet -     Hemoglobin A1c -     Tirzepatide; Inject 2.5 mg into the skin once a week.  Dispense: 2 mL; Refill: 0 -     Tirzepatide; Inject 5 mg into the skin once a week.  Dispense: 2 mL; Refill: 0  Hyperlipidemia, unspecified hyperlipidemia type Assessment & Plan: Taking Simvastatin 10 mg daily. Continue. We will check lipid panel today.  Orders: -     Lipid panel  Nodule of skin of finger of left hand Assessment & Plan: Tender, blister-like lesions have developed on the hand, with no other joint pain. Patient concerned about RA. We will check autoimmune labs today. Advise monitoring lesions and report if she spreads, changes or worsens.  Orders: -     ANA,IFA RA Diag Pnl w/rflx Tit/Patn  Tobacco abuse Assessment & Plan: Currently smoking and not ready to quit. Discussed health risks and benefits of quitting. Encouraged considering quitting and offered support when ready.    Vitamin D deficiency Assessment & Plan: No longer taking vitamin D supplement. We will check vitamin D level today.   Orders: -     VITAMIN D 25 Hydroxy (Vit-D Deficiency, Fractures)  Fatty liver -     Comprehensive metabolic panel  Thyroid disorder screen -     TSH   Return in about 4 weeks  (around 05/11/2023) for Annual Exam.   Bethanie Dicker, NP-C [facility] - [facility]

## 2023-04-13 NOTE — Assessment & Plan Note (Signed)
Taking Simvastatin 10 mg daily. Continue. We will check lipid panel today.

## 2023-04-13 NOTE — Assessment & Plan Note (Signed)
Currently smoking and not ready to quit. Discussed health risks and benefits of quitting. Encouraged considering quitting and offered support when ready.

## 2023-04-14 ENCOUNTER — Encounter: Payer: Self-pay | Admitting: Nurse Practitioner

## 2023-04-14 ENCOUNTER — Other Ambulatory Visit (HOSPITAL_COMMUNITY): Payer: Self-pay

## 2023-04-14 ENCOUNTER — Telehealth: Payer: Self-pay

## 2023-04-14 NOTE — Telephone Encounter (Signed)
Pharmacy Patient Advocate Encounter   Received notification from [facility] Portal that prior authorization for Mounjaro 2.5 mg/ml auto injectors is required/requested.   Insurance verification completed.   The patient is insured through [facility] .   Per test claim: PA required; PA submitted to above mentioned insurance via Prompt PA Key/confirmation #/EOC [id number] Status is pending

## 2023-04-14 NOTE — Telephone Encounter (Signed)
Pharmacy Patient Advocate Encounter  Received notification from [facility] that Prior Authorization for Mounjaro 2.5 mg/ml auto injectors  has been APPROVED from 04/14/2023 to 04/13/2024   PA #/Case ID/Reference #: [id number]

## 2023-04-15 ENCOUNTER — Other Ambulatory Visit: Payer: Self-pay

## 2023-04-15 DIAGNOSIS — R899 Unspecified abnormal finding in specimens from other organs, systems and tissues: Secondary | ICD-10-CM

## 2023-04-16 LAB — ANA,IFA RA DIAG PNL W/RFLX TIT/PATN
Anti Nuclear Antibody (ANA): NEGATIVE
Cyclic Citrullin Peptide Ab: <16 U
Rheumatoid fact SerPl-aCnc: <10 [IU]/mL (ref <14)

## 2023-04-17 ENCOUNTER — Other Ambulatory Visit: Payer: Self-pay

## 2023-04-26 ENCOUNTER — Encounter: Payer: Self-pay | Admitting: Nurse Practitioner

## 2023-04-27 ENCOUNTER — Telehealth: Payer: Self-pay

## 2023-04-27 NOTE — Telephone Encounter (Signed)
Pt has new [employer] streamer pt uploaded new card via [place]  PA needed for [employer]

## 2023-04-28 ENCOUNTER — Other Ambulatory Visit: Payer: Self-pay

## 2023-04-28 ENCOUNTER — Telehealth: Payer: Self-pay

## 2023-04-28 ENCOUNTER — Other Ambulatory Visit (HOSPITAL_COMMUNITY): Payer: Self-pay

## 2023-04-28 MED ORDER — CYCLOBENZAPRINE HCL 5 MG PO TABS
5.0000 mg | ORAL_TABLET | Freq: Every day | ORAL | 0 refills | Status: DC
  Filled 2023-04-28: qty 30, 30d supply, fill #0

## 2023-04-28 NOTE — Telephone Encounter (Signed)
PA request has been Submitted. New Encounter created for follow up. For additional info see Pharmacy Prior Auth telephone encounter from 04/28/23.

## 2023-04-28 NOTE — Telephone Encounter (Signed)
 Copied from CRM [id number]. Topic: General - Other >> Apr 28, 2023  1:06 PM Gerardine PARAS wrote: Reason for CRM: Luke called from [facility] healthcare regarding request put in for tirzepatide  (MOUNJARO ) 2.5 MG/0.5ML Pen, Advised medication is approved coverage start date is 04/28/2023 to coverage end date being 04/27/2024

## 2023-04-28 NOTE — Telephone Encounter (Signed)
 Pharmacy Patient Advocate Encounter   Received notification from Pt Calls Messages that prior authorization for Mounjaro  2.5MG /0.5ML auto-injectors is required/requested.   Insurance verification completed.   The patient is insured through [employer] .   Per test claim: PA required; PA submitted to above mentioned insurance via CoverMyMeds Key/confirmation #/EOC [id number] Status is pending

## 2023-04-29 ENCOUNTER — Other Ambulatory Visit (HOSPITAL_COMMUNITY): Payer: Self-pay

## 2023-04-29 ENCOUNTER — Other Ambulatory Visit: Payer: Self-pay

## 2023-04-29 NOTE — Telephone Encounter (Signed)
 Pharmacy Patient Advocate Encounter  Received notification from [employer] that Prior Authorization for Mounjaro  2.5MG /0.5ML auto-injectors  has been APPROVED from 04/28/23 to 04/27/24. Unable to obtain price due to refill too soon rejection, last fill date 04/17/23 next available fill date2/15/25   PA #/Case ID/Reference #: [id number]

## 2023-04-30 ENCOUNTER — Encounter: Payer: Self-pay | Admitting: *Deleted

## 2023-05-05 ENCOUNTER — Other Ambulatory Visit: Payer: Self-pay

## 2023-05-11 ENCOUNTER — Other Ambulatory Visit: Payer: Self-pay

## 2023-05-20 ENCOUNTER — Encounter: Payer: Commercial Managed Care - PPO | Admitting: Nurse Practitioner

## 2023-05-28 ENCOUNTER — Other Ambulatory Visit (HOSPITAL_BASED_OUTPATIENT_CLINIC_OR_DEPARTMENT_OTHER): Payer: Self-pay | Admitting: Nurse Practitioner

## 2023-05-28 DIAGNOSIS — Z1231 Encounter for screening mammogram for malignant neoplasm of breast: Secondary | ICD-10-CM

## 2023-05-31 ENCOUNTER — Encounter (HOSPITAL_BASED_OUTPATIENT_CLINIC_OR_DEPARTMENT_OTHER): Payer: Managed Care, Other (non HMO) | Admitting: Radiology

## 2023-06-03 ENCOUNTER — Other Ambulatory Visit: Payer: Self-pay

## 2023-06-03 ENCOUNTER — Encounter (HOSPITAL_BASED_OUTPATIENT_CLINIC_OR_DEPARTMENT_OTHER): Payer: Self-pay | Admitting: Radiology

## 2023-06-03 ENCOUNTER — Encounter: Payer: Self-pay | Admitting: Nurse Practitioner

## 2023-06-03 ENCOUNTER — Ambulatory Visit (HOSPITAL_BASED_OUTPATIENT_CLINIC_OR_DEPARTMENT_OTHER)
Admission: RE | Admit: 2023-06-03 | Discharge: 2023-06-03 | Disposition: A | Discharge status: Home / Self Care | Source: Ambulatory Visit | Attending: Nurse Practitioner | Admitting: Nurse Practitioner

## 2023-06-03 ENCOUNTER — Ambulatory Visit: Payer: Managed Care, Other (non HMO) | Admitting: Nurse Practitioner

## 2023-06-03 VITALS — BP 120/84 | HR 83 | Temp 97.9°F | Ht 65.0 in | Wt 157.2 lb

## 2023-06-03 DIAGNOSIS — N898 Other specified noninflammatory disorders of vagina: Secondary | ICD-10-CM | POA: Diagnosis not present

## 2023-06-03 DIAGNOSIS — R2232 Localized swelling, mass and lump, left upper limb: Secondary | ICD-10-CM

## 2023-06-03 DIAGNOSIS — Z720 Tobacco use: Secondary | ICD-10-CM

## 2023-06-03 DIAGNOSIS — Z7985 Long-term (current) use of injectable non-insulin antidiabetic drugs: Secondary | ICD-10-CM

## 2023-06-03 DIAGNOSIS — E119 Type 2 diabetes mellitus without complications: Secondary | ICD-10-CM

## 2023-06-03 DIAGNOSIS — Z1231 Encounter for screening mammogram for malignant neoplasm of breast: Secondary | ICD-10-CM | POA: Insufficient documentation

## 2023-06-03 MED ORDER — TIRZEPATIDE 5 MG/0.5ML ~~LOC~~ SOAJ
5.0000 mg | SUBCUTANEOUS | 0 refills | Status: DC
  Filled 2023-06-03: qty 2, 28d supply, fill #0

## 2023-06-03 MED ORDER — TIRZEPATIDE 7.5 MG/0.5ML ~~LOC~~ SOAJ
7.5000 mg | SUBCUTANEOUS | 0 refills | Status: DC
Start: 2023-06-03 — End: 2023-08-05
  Filled 2023-06-03 – 2023-07-06 (×2): qty 2, 28d supply, fill #0

## 2023-06-03 NOTE — Assessment & Plan Note (Signed)
 A blister-like bump on the external vagina with smaller surrounding bumps noted on exam, with a smaller open sore area noted below. Concern for HSV, culture swab obtained. Will check HSV antibodies also. Counseled on disease transmission. Denies any known exposure. Denies any history of HSV. Further work up pending lab/swab results.

## 2023-06-03 NOTE — Assessment & Plan Note (Addendum)
 Painful blister-like lesions on fingers have persisted for two months. Previous labs were negative for autoimmune conditions. Consider occupational exposure as a potential cause as well as HSV, as similar lesions now present on vagina. Check additional labs to investigate further.

## 2023-06-03 NOTE — Assessment & Plan Note (Signed)
 Weight gain is likely due to incorrect Mounjaro administration. Discussed diet, exercise, and a potential switch back to Ozempic. Instruct on correct Mounjaro administration technique, ensuring subcutaneous injection without bubble. Repeat 5 mg dose of Mounjaro, then increase to 7.5 mg. Emphasize the importance of diet and exercise.

## 2023-06-03 NOTE — Progress Notes (Signed)
 Lori Dicker, NP-C Phone: [phone]  BRITLEY Lang is a 53 y.o. female who presents today for follow up.   Discussed the use of AI scribe software for clinical note transcription with the patient, who gave verbal consent to proceed.  History of Present Illness   The patient presents with concerns about weight gain and medication administration issues.  She has gained ten pounds since starting [place], increasing from 145 to 157 pounds, and is concerned about this weight gain. She aims to return to 145 pounds. She has been administering the medication in her legs, creating a bubble under the skin, which may not be the correct method. She previously used Ozempic with success.  She reports a new bump on the outside of her vagina that appeared on Monday. It is not open but has small bumps around it and resembles a blister. It is not extremely painful, but she is aware of its presence. No history of herpes.  She continues to experience issues with her fingers, describing them as painful and blistery. This has been ongoing for about two months, with symptoms including swelling and pain, particularly in one finger that was previously injured. The blisters appeared about a week or two before her last visit. Previous tests for autoimmune conditions, including rheumatoid arthritis, were negative.  She experiences night sweats, waking up with soaking wet pajamas and sheets. This has occurred three or four times recently and is described as a new symptom, different from her usual hot flashes. She underwent a total hysterectomy at age 25 and has been experiencing hot flashes since then. No excessive thirst or urination.  She is due for a mammogram, which is scheduled for later today and completed a Cologuard test last year. She is eligible for a lung cancer screening due to her smoking history but mentions difficulty scheduling the screening due to work constraints.      Social History   Tobacco Use   Smoking Status Every Day   Current packs/day: 1.00   Average packs/day: 1 pack/day for 30.0 years (30.0 ttl pk-yrs)   Types: Cigarettes  Smokeless Tobacco Never  Tobacco Comments   0.75PPD 05/01/2021    Current Outpatient Medications on File Prior to Visit  Medication Sig Dispense Refill   cyclobenzaprine (FLEXERIL) 5 MG tablet Take 1 tablet (5 mg total) by mouth at bedtime. 30 tablet 0   ibuprofen (ADVIL) 600 MG tablet Take 1 tablet (600 mg total) by mouth every 6 (six) hours as needed. 30 tablet 0   simvastatin (ZOCOR) 10 MG tablet Take 1 tablet (10 mg total) by mouth at bedtime. 90 tablet 2   No current facility-administered medications on file prior to visit.    ROS see history of present illness  Objective  Physical Exam Vitals:   06/03/23 0811  BP: 120/84  Pulse: 83  Temp: 97.9 F (36.6 C)  SpO2: 100%    BP Readings from Last 3 Encounters:  06/03/23 120/84  04/13/23 118/76  01/21/23 117/73   Wt Readings from Last 3 Encounters:  06/03/23 157 lb 3.2 oz (71.3 kg)  04/13/23 152 lb 6.4 oz (69.1 kg)  01/21/23 142 lb (64.4 kg)    Physical Exam Exam conducted with a chaperone present Donavan Foil, CMA).  Constitutional:      General: She is not in acute distress.    Appearance: Normal appearance.  HENT:     Head: Normocephalic.  Cardiovascular:     Rate and Rhythm: Normal rate and regular rhythm.  Heart sounds: Normal heart sounds.  Pulmonary:     Effort: Pulmonary effort is normal.     Breath sounds: Normal breath sounds.  Genitourinary:    Labia:        Left: Lesion present.        Comments: 2 lesions present. One larger, group of blisters. Smaller below open sore like area. Mild tenderness. No drainage.  Skin:    General: Skin is warm and dry.  Neurological:     General: No focal deficit present.     Mental Status: She is alert.  Psychiatric:        Mood and Affect: Mood normal.        Behavior: Behavior normal.     Assessment/Plan:  Please see individual problem list.  Type 2 diabetes mellitus without complication, without long-term current use of insulin (HCC) Assessment & Plan: Weight gain is likely due to incorrect Mounjaro administration. Discussed diet, exercise, and a potential switch back to Ozempic. Instruct on correct Mounjaro administration technique, ensuring subcutaneous injection without bubble. Repeat 5 mg dose of Mounjaro, then increase to 7.5 mg. Emphasize the importance of diet and exercise.  Orders: -     Tirzepatide; Inject 5 mg into the skin once a week.  Dispense: 2 mL; Refill: 0 -     Tirzepatide; Inject 7.5 mg into the skin once a week.  Dispense: 2 mL; Refill: 0  Vaginal lesion Assessment & Plan: A blister-like bump on the external vagina with smaller surrounding bumps noted on exam, with a smaller open sore area noted below. Concern for HSV, culture swab obtained. Will check HSV antibodies also. Counseled on disease transmission. Denies any known exposure. Denies any history of HSV. Further work up pending lab/swab results.   Orders: -     HSV(herpes simplex vrs) 1+2 ab-IgG -     RPR -     Herpes simplex virus culture  Nodule of skin of finger of left hand Assessment & Plan: Painful blister-like lesions on fingers have persisted for two months. Previous labs were negative for autoimmune conditions. Consider occupational exposure as a potential cause as well as HSV, as similar lesions now present on vagina. Check additional labs to investigate further.    Tobacco abuse -     Ambulatory Referral for Lung Cancer Scre    Return in about 3 months (around 09/03/2023), or if symptoms worsen or fail to improve, for Follow up.   Lori Dicker, NP-C  Primary Care - [facility]

## 2023-06-04 LAB — HSV(HERPES SIMPLEX VRS) I + II AB-IGG
HSV 1 IGG,TYPE SPECIFIC AB: 42.9 {index} — ABNORMAL HIGH
HSV 2 IGG,TYPE SPECIFIC AB: 13.8 {index} — ABNORMAL HIGH

## 2023-06-04 LAB — RPR: RPR Ser Ql: NONREACTIVE

## 2023-06-05 ENCOUNTER — Other Ambulatory Visit: Payer: Self-pay | Admitting: Nurse Practitioner

## 2023-06-05 ENCOUNTER — Other Ambulatory Visit: Payer: Self-pay

## 2023-06-05 DIAGNOSIS — B009 Herpesviral infection, unspecified: Secondary | ICD-10-CM

## 2023-06-05 LAB — HERPES SIMPLEX VIRUS CULTURE
MICRO NUMBER:: 16192167
SPECIMEN QUALITY:: ADEQUATE

## 2023-06-05 MED ORDER — VALACYCLOVIR HCL 1 G PO TABS
1000.0000 mg | ORAL_TABLET | Freq: Two times a day (BID) | ORAL | 0 refills | Status: DC
Start: 2023-06-05 — End: 2023-08-19
  Filled 2023-06-05: qty 14, 7d supply, fill #0

## 2023-06-08 ENCOUNTER — Encounter: Payer: Self-pay | Admitting: Nurse Practitioner

## 2023-07-06 ENCOUNTER — Other Ambulatory Visit: Payer: Self-pay

## 2023-07-08 ENCOUNTER — Ambulatory Visit: Admitting: Orthopedic Surgery

## 2023-08-05 ENCOUNTER — Other Ambulatory Visit: Payer: Self-pay

## 2023-08-05 ENCOUNTER — Telehealth: Payer: Self-pay

## 2023-08-05 ENCOUNTER — Other Ambulatory Visit (HOSPITAL_COMMUNITY): Payer: Self-pay

## 2023-08-05 ENCOUNTER — Ambulatory Visit: Admitting: Family

## 2023-08-05 ENCOUNTER — Ambulatory Visit: Admitting: Orthopedic Surgery

## 2023-08-05 ENCOUNTER — Encounter: Payer: Self-pay | Admitting: Family

## 2023-08-05 ENCOUNTER — Other Ambulatory Visit (INDEPENDENT_AMBULATORY_CARE_PROVIDER_SITE_OTHER): Payer: Self-pay

## 2023-08-05 VITALS — BP 120/78 | HR 70 | Temp 98.0°F | Ht 65.0 in | Wt 169.8 lb

## 2023-08-05 DIAGNOSIS — M79642 Pain in left hand: Secondary | ICD-10-CM

## 2023-08-05 DIAGNOSIS — M79641 Pain in right hand: Secondary | ICD-10-CM

## 2023-08-05 DIAGNOSIS — R591 Generalized enlarged lymph nodes: Secondary | ICD-10-CM

## 2023-08-05 DIAGNOSIS — E119 Type 2 diabetes mellitus without complications: Secondary | ICD-10-CM

## 2023-08-05 DIAGNOSIS — Z7985 Long-term (current) use of injectable non-insulin antidiabetic drugs: Secondary | ICD-10-CM

## 2023-08-05 DIAGNOSIS — M67442 Ganglion, left hand: Secondary | ICD-10-CM | POA: Diagnosis not present

## 2023-08-05 DIAGNOSIS — R590 Localized enlarged lymph nodes: Secondary | ICD-10-CM | POA: Insufficient documentation

## 2023-08-05 MED ORDER — SEMAGLUTIDE (1 MG/DOSE) 4 MG/3ML ~~LOC~~ SOPN
1.0000 mg | PEN_INJECTOR | SUBCUTANEOUS | 3 refills | Status: DC
Start: 2023-08-05 — End: 2023-08-19
  Filled 2023-08-05 (×2): qty 3, 28d supply, fill #0

## 2023-08-05 NOTE — Assessment & Plan Note (Addendum)
 No systemic URI symptoms. Smoker Right lateral neck supraclavicular 2cm soft non tender mass.  Left posterior auricular 1cm hardened mass, non tender.  CT neck to evaluate lymphadenopathy and enlarged thyroid  on exam.  Reiterated the importance of scheduling CT lung cancer screen. Phone number provided to schedule

## 2023-08-05 NOTE — Telephone Encounter (Signed)
 Pharmacy Patient Advocate Encounter   Received notification from CoverMyMeds that prior authorization for Ozempic  (1 MG/DOSE) 4MG /3ML pen-injectors is required/requested.   Insurance verification completed.   The patient is insured through [employer] .   Per test claim: PA required; PA submitted to above mentioned insurance via CoverMyMeds Key/confirmation #/EOC [id number] Status is pending

## 2023-08-05 NOTE — Assessment & Plan Note (Signed)
 Previously well controlled. She has gained weight on Mounjaro  7.5mg  and prefers to resume Ozempic  which she had previously done quite well with. Stop mounjaro . Start ozempic  1mg  and titrate.  Counseled on black box warning regarding MEN, MLC, and pancreatitis and also side effects including constipation, nausea.

## 2023-08-05 NOTE — Progress Notes (Signed)
 Assessment & Plan:  Lymphadenopathy Assessment & Plan: No systemic URI symptoms. Smoker Right lateral neck supraclavicular 2cm soft non tender mass.  Left posterior auricular 1cm hardened mass, non tender.  CT neck to evaluate lymphadenopathy and enlarged thyroid  on exam.  Reiterated the importance of scheduling CT lung cancer screen. Phone number provided to schedule  Orders: -     CT SOFT TISSUE NECK W CONTRAST; Future  Type 2 diabetes mellitus without complication, without long-term current use of insulin (HCC) Assessment & Plan: Previously well controlled. She has gained weight on Mounjaro  7.5mg  and prefers to resume Ozempic  which she had previously done quite well with. Stop mounjaro . Start ozempic  1mg  and titrate.  Counseled on black box warning regarding MEN, MLC, and pancreatitis and also side effects including constipation, nausea.   Orders: -     Semaglutide  (1 MG/DOSE); Inject 1 mg as directed once a week.  Dispense: 3 mL; Refill: 3     Return precautions given.   Risks, benefits, and alternatives of the medications and treatment plan prescribed today were discussed, and patient expressed understanding.   Education regarding symptom management and diagnosis given to patient on AVS either electronically or printed.  No follow-ups on file.  Bascom Bossier, FNP  Subjective:    Patient ID: Lori Lang, female    DOB: 01/04/1971, 53 y.o.   MRN: [id number]  CC: Lori Lang is a 53 y.o. female who presents today for an acute visit.    HPI: Complains of right sided neck 'lump' x 2 weeks, slightly smaller now  Non tender.   For years she has also noted left posterior ear hard lump. Non tender. Unchanged.   No fever, cough, sinus pressure, sore throat      H/o DM  Mammogram UTD  Smoker   H/o enlarged thyroid   She would like to return from mounjaro  to ozempic  ( stoppped d/t cost). She has new insurance now. She has gained weight on mounjaro  7.5mg .    No personal or family h/o MLC, MEN  No personal h/o pancreatitis  Tolerated ozempic  well before without constipation  She has received a phone call to schedule CT lung cancer screen and plans to call them back.  Allergies: Patient has no known allergies. Current Outpatient Medications on File Prior to Visit  Medication Sig Dispense Refill   cyclobenzaprine  (FLEXERIL ) 5 MG tablet Take 1 tablet (5 mg total) by mouth at bedtime. 30 tablet 0   ibuprofen  (ADVIL ) 600 MG tablet Take 1 tablet (600 mg total) by mouth every 6 (six) hours as needed. 30 tablet 0   simvastatin  (ZOCOR ) 10 MG tablet Take 1 tablet (10 mg total) by mouth at bedtime. 90 tablet 2   valACYclovir  (VALTREX ) 1000 MG tablet Take 1 tablet (1,000 mg total) by mouth 2 (two) times daily. 14 tablet 0   No current facility-administered medications on file prior to visit.    Review of Systems  Constitutional:  Negative for chills and fever.  HENT:  Negative for congestion, sinus pressure, trouble swallowing and voice change.   Respiratory:  Negative for cough and shortness of breath.   Cardiovascular:  Negative for chest pain and palpitations.  Gastrointestinal:  Negative for constipation, nausea and vomiting.  Hematological:  Positive for adenopathy.      Objective:    BP 120/78   Pulse 70   Temp 98 F (36.7 C) (Oral)   Ht 5\' 5"  (1.651 m)   Wt 169 lb 12.8 oz (77  kg)   SpO2 97%   BMI 28.26 kg/m   BP Readings from Last 3 Encounters:  08/05/23 120/78  06/03/23 120/84  04/13/23 118/76   Wt Readings from Last 3 Encounters:  08/05/23 169 lb 12.8 oz (77 kg)  06/03/23 157 lb 3.2 oz (71.3 kg)  04/13/23 152 lb 6.4 oz (69.1 kg)    Physical Exam Vitals reviewed.  Constitutional:      Appearance: She is well-developed.  HENT:     Head: Normocephalic and atraumatic.     Right Ear: Hearing, tympanic membrane, ear canal and external ear normal. No decreased hearing noted. No drainage, swelling or tenderness. No middle  ear effusion. No foreign body. Tympanic membrane is not erythematous or bulging.     Left Ear: Hearing, tympanic membrane, ear canal and external ear normal. No decreased hearing noted. No drainage, swelling or tenderness.  No middle ear effusion. No foreign body. Tympanic membrane is not erythematous or bulging.     Nose: Nose normal. No rhinorrhea.     Right Sinus: No maxillary sinus tenderness or frontal sinus tenderness.     Left Sinus: No maxillary sinus tenderness or frontal sinus tenderness.     Mouth/Throat:     Pharynx: Uvula midline. No oropharyngeal exudate or posterior oropharyngeal erythema.     Tonsils: No tonsillar abscesses.  Eyes:     Conjunctiva/sclera: Conjunctivae normal.  Neck:     Thyroid : Thyromegaly present. No thyroid  mass or thyroid  tenderness.  Cardiovascular:     Rate and Rhythm: Normal rate and regular rhythm.     Pulses: Normal pulses.     Heart sounds: Normal heart sounds.  Pulmonary:     Effort: Pulmonary effort is normal.     Breath sounds: Normal breath sounds. No wheezing, rhonchi or rales.  Lymphadenopathy:     Head:     Right side of head: No submental, submandibular, tonsillar, preauricular, posterior auricular or occipital adenopathy.     Left side of head: Posterior auricular adenopathy present. No submental, submandibular, tonsillar, preauricular or occipital adenopathy.     Cervical: No cervical adenopathy.     Upper Body:     Right upper body: Supraclavicular adenopathy present. No axillary adenopathy.     Left upper body: No supraclavicular or axillary adenopathy.     Comments: Right lateral neck supraclavicular 2cm soft non tender mass.  Left posterior auricular 1cm hardened mass, non tender.  Skin:    General: Skin is warm and dry.  Neurological:     Mental Status: She is alert.  Psychiatric:        Speech: Speech normal.        Behavior: Behavior normal.        Thought Content: Thought content normal.

## 2023-08-05 NOTE — Patient Instructions (Addendum)
 Call to make an appointment for Annual Lung cancer screen  With CT Chest:  [phone]. Let me know if any issues in doing so.  Ordered CT neck  Let us  know if you dont hear back within a week in regards to an appointment being scheduled.   So that you are aware, if you are [facility] MyChart user , please pay attention to your MyChart messages as you may receive a MyChart message with a phone number to call and schedule this test/appointment own your own from our referral coordinator. This is a new process so I do not want you to miss this message.  If you are not a MyChart user, you will receive a phone call.    Stop mounjaro    You may start with ozempic  1mg  weekly for 2 weeks, if tolerating you increase to 2mg  once weekly and request a new prescription  Monitor for constipation

## 2023-08-05 NOTE — Progress Notes (Signed)
 Lori Lang - 53 y.o. female MRN [id number]  Date of birth: 09-May-1970  Office Visit Note: Visit Date: 08/05/2023 PCP: Bluford Burkitt, NP Referred by: Bluford Burkitt, NP  Subjective: No chief complaint on file.  HPI: Lori Lang is a pleasant 53 y.o. female who presents today for evaluation of bilateral hand mucous cyst at the DIP joints, she states that the masses will come and go occasionally.  Her most symptomatic 1 is the left index finger DIP region.  There is significant pain in this area and she is interested in potential surgical excision.  Nondiabetic, does smoke approximately three fourths of a pack per day.  Pertinent ROS were reviewed with the patient and found to be negative unless otherwise specified above in HPI.   Visit Reason: bilateral hands mucous cyst, most notable left index finger DIP Duration of symptoms:years Hand dominance: right Occupation: Diabetic: No Smoking: Yes Heart/Lung History: none Blood Thinners: none  Prior Testing/EMG: none Injections (Date): none Treatments:none Prior Surgery: none  Assessment & Plan: Visit Diagnoses:  1. Digital mucous cyst of finger of left hand   2. Bilateral hand pain     Plan: Extensive discussion was had with the patient today regarding her mucous cyst of the bilateral hands.  I reviewed her x-rays in detail with her today which do show diffuse DIP joint arthritis bilaterally which is the underlying cause for these ongoing mucous cysts.  We discussed the underlying etiology and pathophysiology of this condition as well as treat modalities ranging from conservative to surgical.  From conserver standpoint, we discussed ongoing observation, [place] wrapping, activity modification and anti-inflammatory medication both topical and oral.  From a surgical standpoint, we discussed cyst excision and DIP joint debridement.  I did mention that the underlying arthritis does persist in these cases, there is a possibility for  recurrence of the cyst after excision.  From a definitive standpoint, we discussed the possibility of DIP fusion in the future should the mucous cyst become recurrent.  In particular, she would like to focus on the left index finger mucous cyst at the DIP joint currently which is appropriate.  Risks and benefits of the procedure were discussed, risks including but not limited to infection, bleeding, scarring, stiffness, nerve injury, tendon injury, vascular injury, recurrence of symptoms, need for skin advancement flap and need for subsequent operation.  We also discussed the appropriate postoperative protocol and timeframe for return to activities and function.  Patient expressed understanding.    Will move forward with surgical scheduling of left index finger DIP mucous cyst excision at the next available surgical date per patient preference.    Follow-up: No follow-ups on file.   Meds & Orders: No orders of the defined types were placed in this encounter.   Orders Placed This Encounter  Procedures   XR Hand Complete Right   XR Hand Complete Left     Procedures: No procedures performed      Clinical History: No specialty comments available.  She reports that she has been smoking cigarettes. She has a 30 pack-year smoking history. She has never used smokeless tobacco.  Recent Labs    04/13/23 0831  HGBA1C 5.8    Objective:   Vital Signs: There were no vitals taken for this visit.  Physical Exam  Gen: Well-appearing, in no acute distress; non-toxic CV: Regular Rate. Well-perfused. Warm.  Resp: Breathing unlabored on room air; no wheezing. Psych: Fluid speech in conversation; appropriate affect; normal thought process  Ortho Exam Left hand: Index finger - Palpable cyst over the radial border of the DIP region, measures approximately 1.5 cm x 1.5 cm, painful to palpation, soft, compressible, no significant nailbed deformity or nail plate deformity - DIP range of motion  0-45 - Pinch strength at the index right 12, left 6  Imaging: XR Hand Complete Right Result Date: 08/05/2023 X-rays of the right  hand, focused on the index DIP region were performed today There is no evidence of fracture or dislocation. Soft tissues are unremarkable. 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  XR Hand Complete Left Result Date: 08/05/2023 X-rays of the left hand, focused on the index DIP region were performed today X-rays demonstrate arthritic changes at the DIP of the index finger with joint space narrowing and dorsal osteophyte formation.  There is a calcification present over the dorsal aspect of the DIP joint as well.  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   Past Medical/Family/Surgical/Social History: Medications & Allergies reviewed per EMR, new medications updated. Patient Active Problem List   Diagnosis Date Noted   Lymphadenopathy 08/05/2023   Vaginal lesion 06/03/2023   Nodule of skin of finger of left hand 04/13/2023   Generalized abdominal pain 01/13/2023   S/P laparoscopic cholecystectomy 01/02/2023   Acute cholecystitis 12/23/2022   BMI 32.0-32.9,adult 09/27/2021   Type 2 diabetes mellitus without complication, without long-term current use of insulin (HCC) 09/27/2021   Hyperlipidemia 09/27/2021   Obesity (BMI 30-39.9) 04/17/2021   COVID-19 04/17/2021   Frontal sinusitis 04/17/2021   Liver hemangioma 03/14/2021   Liver lesion 03/14/2021   Fatty liver 03/14/2021   Aortic atherosclerosis (HCC) 12/26/2019   Overweight (BMI 25.0-29.9) 12/22/2019   Lumbar radiculopathy 05/25/2019   Sciatica (Left) 07/14/2018   Chronic pain syndrome 07/14/2018   Preventative health care 07/14/2018   Disorder of skeletal system 07/14/2018   Problems influencing health status 07/14/2018   Failed back surgical syndrome 07/14/2018   Chronic low back pain (Secondary Area of Pain) (Left) w/ sciatica (Left)  07/14/2018   Chronic lower extremity pain (Primary area of Pain) (Left) 07/14/2018   Lumbar facet arthropathy 07/14/2018   DDD (degenerative disc disease), lumbosacral 07/14/2018   Leg cramps (Bilateral) 07/14/2018   Displacement of lumbar intervertebral disc (L5-S1) w/ radiculopathy (S1) (Left) 07/14/2018   Lumbar spondylosis 07/14/2018   Chronic musculoskeletal pain 07/14/2018   Neurogenic pain 07/14/2018   Abnormal MRI, lumbar spine 07/14/2018   Nocturnal leg cramps (Bilateral) 06/22/2018   Vitamin D  deficiency 08/31/2017   HLD (hyperlipidemia) 08/31/2017   Tobacco abuse 08/31/2017   Polycythemia 08/31/2017   Hot flashes 08/25/2017   Lumbosacral radiculopathy (S1) (Left) 08/25/2017   Past Medical History:  Diagnosis Date   Chicken pox    COVID-19    03/2020, 03/2021   Family history of adverse reaction to anesthesia 1996   mother did not wake up   GERD (gastroesophageal reflux disease)    History of kidney stones    passed 3 , litrotrispy   Hyperlipidemia    Liver lesion    hemangioma vs FNH vs adenoma noted MRI 2008 (Dr. Ole Berkeley)    Pneumonia    Pre-diabetes    Prediabetes    Trigger finger of left thumb    Family History  Problem Relation Age of Onset   Arthritis Mother    Early death Mother    Heart disease Mother        MVP with repair died  from anestheisa    Hyperlipidemia Mother    Hypertension Mother    Kidney disease Mother    Pulmonary embolism Mother    Early death Father    Diabetes Father    Hyperlipidemia Father    Heart disease Father    Hypertension Father    Kidney disease Father    Mitral valve prolapse Father    Deep vein thrombosis Father    Pulmonary embolism Father        caused him to die   Diabetes Mellitus I Father    Kidney failure Father        s/p transplant   Diabetes Maternal Grandmother    Heart disease Maternal Grandmother    Heart disease Maternal Grandfather    Early death Paternal Grandmother    Diabetes Paternal  Grandmother    Cancer Paternal Grandfather        prostate    Asthma Daughter    Depression Daughter    Bipolar disorder Daughter    Thyroid  cancer Neg Hx    Past Surgical History:  Procedure Laterality Date   ABDOMINAL HYSTERECTOMY     2006/2007 for endometriosis    BACK SURGERY     L4/5 discectomy 09/18/2015 Dr. Jackee Marus    breast reduction     1994   EXTRACORPOREAL SHOCK WAVE LITHOTRIPSY Right 06/07/2015   Procedure: EXTRACORPOREAL SHOCK WAVE LITHOTRIPSY (ESWL);  Surgeon: Rea Cambridge, MD;  Location: [facility];  Service: Urology;  Laterality: Right;   LAPAROTOMY  12/23/2022   Procedure: LAPAROTOMY;  Surgeon: Alben Alma, MD;  Location: [facility];  Service: General;;   LYSIS OF ADHESION  12/23/2022   Procedure: LYSIS OF ADHESION;  Surgeon: Alben Alma, MD;  Location: [facility];  Service: General;;   RADIOLOGY WITH ANESTHESIA N/A 07/19/2015   Procedure: MRI LUMBER SPIN WITHOUT;  Surgeon: Medication Radiologist, MD;  Location: [facility];  Service: Radiology;  Laterality: N/A;   RADIOLOGY WITH ANESTHESIA N/A 09/22/2017   Procedure: MRI LUMBAR SPINE WITHOUT CONTRAST;  Surgeon: Radiologist, Medication, MD;  Location: [facility];  Service: Radiology;  Laterality: N/A;   RADIOLOGY WITH ANESTHESIA N/A 04/09/2021   Procedure: MRI LIVER WITH AND WITHOUT CONTRAST WITH ANESTHESIA;  Surgeon: Radiologist, Medication, MD;  Location: [facility];  Service: Radiology;  Laterality: N/A;   SHOULDER OPEN ROTATOR CUFF REPAIR Bilateral 2007 2008   right 2007, left 2008    TRIGGER FINGER RELEASE Left 02/18/2016   Procedure: LEFT TRIGGER THUMB RELEASE;  Surgeon: Rober Chimera, MD;  Location: [facility];  Service: Orthopedics;  Laterality: Left;   WISDOM TOOTH EXTRACTION     Social History   Occupational History   Not on file  Tobacco Use   Smoking status: Every Day    Current packs/day: 1.00    Average packs/day: 1 pack/day for 30.0 years (30.0 ttl pk-yrs)    Types: Cigarettes   Smokeless  tobacco: Never   Tobacco comments:    0.75PPD 05/01/2021  Vaping Use   Vaping status: Never Used  Substance and Sexual Activity   Alcohol use: No   Drug use: No   Sexual activity: Yes    Mikeya Tomasetti Alvia Jointer, M.D. [facility], Hand Surgery

## 2023-08-05 NOTE — Telephone Encounter (Signed)
 Copied from CRM [id number]. Topic: General - Other >> Aug 05, 2023  1:28 PM Allyne Areola wrote: Reason for CRM: [employer] was calling to inform that the prior auth for Ozempic  was approved. Effective date is 08/05/2023-08/04/2024.

## 2023-08-06 ENCOUNTER — Other Ambulatory Visit (HOSPITAL_COMMUNITY): Payer: Self-pay

## 2023-08-06 ENCOUNTER — Other Ambulatory Visit: Payer: Self-pay | Admitting: Family

## 2023-08-06 DIAGNOSIS — R591 Generalized enlarged lymph nodes: Secondary | ICD-10-CM

## 2023-08-14 ENCOUNTER — Ambulatory Visit: Payer: Self-pay | Admitting: Family

## 2023-08-14 ENCOUNTER — Encounter: Payer: Self-pay | Admitting: Family

## 2023-08-14 ENCOUNTER — Ambulatory Visit
Admission: RE | Admit: 2023-08-14 | Discharge: 2023-08-14 | Disposition: A | Discharge status: Home / Self Care | Source: Ambulatory Visit | Attending: Family | Admitting: Family

## 2023-08-14 DIAGNOSIS — R591 Generalized enlarged lymph nodes: Secondary | ICD-10-CM

## 2023-08-14 MED ORDER — IOPAMIDOL (ISOVUE-300) INJECTION 61%
75.0000 mL | Freq: Once | INTRAVENOUS | Status: AC | PRN
  Administered 2023-08-14: 75 mL via INTRAVENOUS

## 2023-08-14 NOTE — Telephone Encounter (Signed)
 See other telephone encounter.

## 2023-08-18 ENCOUNTER — Other Ambulatory Visit: Payer: Self-pay

## 2023-08-18 ENCOUNTER — Other Ambulatory Visit: Payer: Self-pay | Admitting: Family

## 2023-08-18 ENCOUNTER — Encounter: Payer: Self-pay | Admitting: Family

## 2023-08-18 DIAGNOSIS — E119 Type 2 diabetes mellitus without complications: Secondary | ICD-10-CM

## 2023-08-18 NOTE — Telephone Encounter (Signed)
 Pt requesting 2mg . Per last note you informed pt she could titrate up and to send in a request. New dosage pended for appoval

## 2023-08-19 ENCOUNTER — Inpatient Hospital Stay

## 2023-08-19 ENCOUNTER — Other Ambulatory Visit: Payer: Self-pay | Admitting: Family

## 2023-08-19 ENCOUNTER — Inpatient Hospital Stay: Attending: Internal Medicine | Admitting: Internal Medicine

## 2023-08-19 ENCOUNTER — Other Ambulatory Visit: Payer: Self-pay

## 2023-08-19 DIAGNOSIS — R61 Generalized hyperhidrosis: Secondary | ICD-10-CM | POA: Diagnosis not present

## 2023-08-19 DIAGNOSIS — Z8042 Family history of malignant neoplasm of prostate: Secondary | ICD-10-CM | POA: Diagnosis not present

## 2023-08-19 DIAGNOSIS — R590 Localized enlarged lymph nodes: Secondary | ICD-10-CM | POA: Insufficient documentation

## 2023-08-19 DIAGNOSIS — F17210 Nicotine dependence, cigarettes, uncomplicated: Secondary | ICD-10-CM | POA: Diagnosis not present

## 2023-08-19 DIAGNOSIS — E119 Type 2 diabetes mellitus without complications: Secondary | ICD-10-CM

## 2023-08-19 LAB — COMPREHENSIVE METABOLIC PANEL WITH GFR
ALT: 21 U/L (ref 0–44)
AST: 19 U/L (ref 15–41)
Albumin: 4 g/dL (ref 3.5–5.0)
Alkaline Phosphatase: 81 U/L (ref 38–126)
Anion gap: 8 (ref 5–15)
BUN: 15 mg/dL (ref 6–20)
CO2: 27 mmol/L (ref 22–32)
Calcium: 8.9 mg/dL (ref 8.9–10.3)
Chloride: 103 mmol/L (ref 98–111)
Creatinine, Ser: 0.86 mg/dL (ref 0.44–1.00)
GFR, Estimated: >60 mL/min (ref >60)
Glucose, Bld: 80 mg/dL (ref 70–99)
Potassium: 3.4 mmol/L — ABNORMAL LOW (ref 3.5–5.1)
Sodium: 138 mmol/L (ref 135–145)
Total Bilirubin: 0.7 mg/dL (ref 0.0–1.2)
Total Protein: 7.6 g/dL (ref 6.5–8.1)

## 2023-08-19 LAB — HEPATITIS B SURFACE ANTIGEN: Hepatitis B Surface Ag: NONREACTIVE

## 2023-08-19 LAB — CBC WITH DIFFERENTIAL/PLATELET
Abs Immature Granulocytes: 0.03 10*3/uL (ref 0.00–0.07)
Basophils Absolute: 0 10*3/uL (ref 0.0–0.1)
Basophils Relative: 1 %
Eosinophils Absolute: 0.1 10*3/uL (ref 0.0–0.5)
Eosinophils Relative: 1 %
HCT: 45.2 % (ref 36.0–46.0)
Hemoglobin: 15.5 g/dL — ABNORMAL HIGH (ref 12.0–15.0)
Immature Granulocytes: 1 %
Lymphocytes Relative: 43 %
Lymphs Abs: 1.8 10*3/uL (ref 0.7–4.0)
MCH: 30.2 pg (ref 26.0–34.0)
MCHC: 34.3 g/dL (ref 30.0–36.0)
MCV: 87.9 fL (ref 80.0–100.0)
Monocytes Absolute: 0.6 10*3/uL (ref 0.1–1.0)
Monocytes Relative: 14 %
Neutro Abs: 1.8 10*3/uL (ref 1.7–7.7)
Neutrophils Relative %: 40 %
Platelets: 297 10*3/uL (ref 150–400)
RBC: 5.14 MIL/uL — ABNORMAL HIGH (ref 3.87–5.11)
RDW: 14 % (ref 11.5–15.5)
WBC: 4.3 10*3/uL (ref 4.0–10.5)
nRBC: 0 % (ref 0.0–0.2)

## 2023-08-19 LAB — HEPATITIS B CORE ANTIBODY, IGM: Hep B C IgM: NONREACTIVE

## 2023-08-19 LAB — HEPATITIS C ANTIBODY: HCV Ab: NONREACTIVE

## 2023-08-19 LAB — HIV ANTIBODY (ROUTINE TESTING W REFLEX): HIV Screen 4th Generation wRfx: NONREACTIVE

## 2023-08-19 LAB — LACTATE DEHYDROGENASE: LDH: 165 U/L (ref 98–192)

## 2023-08-19 MED ORDER — OZEMPIC (2 MG/DOSE) 8 MG/3ML ~~LOC~~ SOPN
2.0000 mg | PEN_INJECTOR | SUBCUTANEOUS | 2 refills | Status: DC
Start: 2023-08-19 — End: 2023-11-20
  Filled 2023-08-19: qty 3, fill #0
  Filled 2023-08-19 – 2023-08-27 (×4): qty 3, 28d supply, fill #0
  Filled 2023-09-21: qty 3, 28d supply, fill #1
  Filled 2023-10-22: qty 3, 28d supply, fill #2

## 2023-08-19 NOTE — Progress Notes (Signed)
 Patient is doing ok, she just has two lumps on her neck that she would like to be checked.

## 2023-08-19 NOTE — Progress Notes (Signed)
 [facility] CONSULT NOTE  Patient Care Team: Bluford Burkitt, NP as PCP - General (Nurse Practitioner) Gwyn Leos, MD as Consulting Physician (Oncology)  CHIEF COMPLAINTS/PURPOSE OF CONSULTATION:  enlarged lymph nodes   Lymph nodes: Palpable marker correlates with the posterior triangle enlarged lymph node on the right which measures 14 mm length. Palpable marker on the left likewise relates to an enlarged lymph node measuring 14 mm in length. Diffuse mild prominence of cervical lymph nodes with homogeneous density.   Vascular: Mild atheromatous calcification.   Limited intracranial: Unremarkable   Visualized orbits: Unremarkable   Mastoids and visualized paranasal sinuses: Clear   Skeleton: Unremarkable   Upper chest: Clear apical lungs   IMPRESSION: Palpable markers correlate with mildly enlarged lymph nodes - generalized mild cervical adenopathy. Findings could relate to systemic infection (such is URI if acute or HIV of chronic), granulomatous disease, or lymphoproliferative disease.     Electronically Signed   By: Ronnette Coke M.D.   On: 08/14/2023 09:50  HISTORY OF PRESENTING ILLNESS: Patient ambulating-independently. Accompanied by husband.   Lori Lang 53 y.o.  female pleasant patient with no prior history of any malignancy has been referred to us  for further evaluation recommendations for lymphadenopathy.  Patient states that Lori Lang noted to have a lump in the right neck about 2 to 3 weeks ago.  It is not growing.  Lori Lang denies any lumps or bumps anywhere else.  Lori Lang had about 2 episodes of profuse sweating over the last 3 weeks.  Sweats were drenching and Lori Lang had to change.  Of note patient has had chronic sweats-related to her history of hysterectomy.  Otherwise denies any weight loss.  Any fatigue.  Denies any unusual shortness of breath or cough or chest pain.  Patient denies any history of difficulty swallowing.  No hoarseness of  voice.  Review of Systems  Constitutional:  Positive for diaphoresis. Negative for chills, fever, malaise/fatigue and weight loss.  HENT:  Negative for nosebleeds and sore throat.   Eyes:  Negative for double vision.  Respiratory:  Negative for cough, hemoptysis, sputum production, shortness of breath and wheezing.   Cardiovascular:  Negative for chest pain, palpitations, orthopnea and leg swelling.  Gastrointestinal:  Negative for abdominal pain, blood in stool, constipation, diarrhea, heartburn, melena, nausea and vomiting.  Genitourinary:  Negative for dysuria, frequency and urgency.  Musculoskeletal:  Positive for joint pain. Negative for back pain.  Skin: Negative.  Negative for itching and rash.  Neurological:  Negative for dizziness, tingling, focal weakness, weakness and headaches.  Endo/Heme/Allergies:  Does not bruise/bleed easily.  Psychiatric/Behavioral:  Negative for depression. The patient is not nervous/anxious and does not have insomnia.     MEDICAL HISTORY:  Past Medical History:  Diagnosis Date   Chicken pox    COVID-19    03/2020, 03/2021   Family history of adverse reaction to anesthesia 1996   mother did not wake up   GERD (gastroesophageal reflux disease)    History of kidney stones    passed 3 , litrotrispy   Hyperlipidemia    Liver lesion    hemangioma vs FNH vs adenoma noted MRI 2008 (Dr. Ole Berkeley)    Pneumonia    Pre-diabetes    Prediabetes    Trigger finger of left thumb     SURGICAL HISTORY: Past Surgical History:  Procedure Laterality Date   ABDOMINAL HYSTERECTOMY     2006/2007 for endometriosis    BACK SURGERY     L4/5 discectomy  09/18/2015 Dr. Jackee Marus    breast reduction     1994   EXTRACORPOREAL SHOCK WAVE LITHOTRIPSY Right 06/07/2015   Procedure: EXTRACORPOREAL SHOCK WAVE LITHOTRIPSY (ESWL);  Surgeon: Rea Cambridge, MD;  Location: [facility];  Service: Urology;  Laterality: Right;   LAPAROTOMY  12/23/2022   Procedure: LAPAROTOMY;  Surgeon:  Alben Alma, MD;  Location: [facility];  Service: General;;   LYSIS OF ADHESION  12/23/2022   Procedure: LYSIS OF ADHESION;  Surgeon: Alben Alma, MD;  Location: [facility];  Service: General;;   RADIOLOGY WITH ANESTHESIA N/A 07/19/2015   Procedure: MRI LUMBER SPIN WITHOUT;  Surgeon: Medication Radiologist, MD;  Location: [facility];  Service: Radiology;  Laterality: N/A;   RADIOLOGY WITH ANESTHESIA N/A 09/22/2017   Procedure: MRI LUMBAR SPINE WITHOUT CONTRAST;  Surgeon: Radiologist, Medication, MD;  Location: [facility] OR;  Service: Radiology;  Laterality: N/A;   RADIOLOGY WITH ANESTHESIA N/A 04/09/2021   Procedure: MRI LIVER WITH AND WITHOUT CONTRAST WITH ANESTHESIA;  Surgeon: Radiologist, Medication, MD;  Location: [facility];  Service: Radiology;  Laterality: N/A;   SHOULDER OPEN ROTATOR CUFF REPAIR Bilateral 2007 2008   right 2007, left 2008    TRIGGER FINGER RELEASE Left 02/18/2016   Procedure: LEFT TRIGGER THUMB RELEASE;  Surgeon: Rober Chimera, MD;  Location: [facility];  Service: Orthopedics;  Laterality: Left;   WISDOM TOOTH EXTRACTION      SOCIAL HISTORY: Social History   Socioeconomic History   Marital status: Married    Spouse name: Not on file   Number of children: Not on file   Years of education: Not on file   Highest education level: Some college, no degree  Occupational History   Not on file  Tobacco Use   Smoking status: Every Day    Current packs/day: 1.00    Average packs/day: 1 pack/day for 30.0 years (30.0 ttl pk-yrs)    Types: Cigarettes   Smokeless tobacco: Never   Tobacco comments:    0.75PPD 05/01/2021  Vaping Use   Vaping status: Never Used  Substance and Sexual Activity   Alcohol use: No   Drug use: No   Sexual activity: Yes  Other Topics Concern   Not on file  Social History Narrative   Married    2 kids son going to army   Daughter going to [employer] fall 2019    Owns guns    Wears seat belt    Safe in relationship    Social Drivers of Health    Financial Resource Strain: Low Risk  (04/13/2023)   Overall Financial Resource Strain (CARDIA)    Difficulty of Paying Living Expenses: Not hard at all  Food Insecurity: No Food Insecurity (04/13/2023)   Hunger Vital Sign    Worried About Running Out of Food in the Last Year: Never true    Ran Out of Food in the Last Year: Never true  Transportation Needs: No Transportation Needs (04/13/2023)   PRAPARE - Administrator, Civil Service (Medical): No    Lack of Transportation (Non-Medical): No  Physical Activity: Unknown (04/13/2023)   Exercise Vital Sign    Days of Exercise per Week: 0 days    Minutes of Exercise per Session: Not on file  Stress: No Stress Concern Present (04/13/2023)   [employer] of Occupational Health - Occupational Stress Questionnaire    Feeling of Stress : Only a little  Social Connections: Moderately Integrated (04/13/2023)   Social Connection and Isolation Panel [  NHANES]    Frequency of Communication with Friends and Family: More than three times a week    Frequency of Social Gatherings with Friends and Family: Once a week    Attends Religious Services: More than 4 times per year    Active Member of [employer] or Organizations: No    Attends Engineer, structural: Not on file    Marital Status: Married  Catering manager Violence: Not At Risk (12/23/2022)   Humiliation, Afraid, Rape, and Kick questionnaire    Fear of Current or Ex-Partner: No    Emotionally Abused: No    Physically Abused: No    Sexually Abused: No    FAMILY HISTORY: Family History  Problem Relation Age of Onset   Arthritis Mother    Early death Mother    Heart disease Mother        MVP with repair died from anestheisa    Hyperlipidemia Mother    Hypertension Mother    Kidney disease Mother    Pulmonary embolism Mother    Early death Father    Diabetes Father    Hyperlipidemia Father    Heart disease Father    Hypertension Father    Kidney disease Father     Mitral valve prolapse Father    Deep vein thrombosis Father    Pulmonary embolism Father        caused him to die   Diabetes Mellitus I Father    Kidney failure Father        s/p transplant   Diabetes Maternal Grandmother    Heart disease Maternal Grandmother    Heart disease Maternal Grandfather    Early death Paternal Grandmother    Diabetes Paternal Grandmother    Cancer Paternal Grandfather        prostate    Asthma Daughter    Depression Daughter    Bipolar disorder Daughter    Thyroid  cancer Neg Hx     ALLERGIES:  has no known allergies.  MEDICATIONS:  Current Outpatient Medications  Medication Sig Dispense Refill   cyclobenzaprine  (FLEXERIL ) 5 MG tablet Take 1 tablet (5 mg total) by mouth at bedtime. 30 tablet 0   Semaglutide , 2 MG/DOSE, (OZEMPIC , 2 MG/DOSE,) 8 MG/3ML SOPN Inject 2 mg into the skin once a week. 3 mL 2   No current facility-administered medications for this visit.    PHYSICAL EXAMINATION:   Vitals:   08/19/23 1407  BP: 138/85  Pulse: 93  Resp: 18  Temp: 97.8 F (36.6 C)  SpO2: 98%   Filed Weights   08/19/23 1407  Weight: 167 lb (75.8 kg)   Rubbery mobile nontender approximately half an inch lymph node noted in the right posterior triangle of the neck.  No tonsillar inflammation.  No tonsil masses noted.  Physical Exam Vitals and nursing note reviewed.  HENT:     Head: Normocephalic and atraumatic.     Mouth/Throat:     Pharynx: Oropharynx is clear.  Eyes:     Extraocular Movements: Extraocular movements intact.     Pupils: Pupils are equal, round, and reactive to light.  Cardiovascular:     Rate and Rhythm: Normal rate and regular rhythm.  Pulmonary:     Comments: Decreased breath sounds bilaterally.  Abdominal:     Palpations: Abdomen is soft.  Musculoskeletal:        General: Normal range of motion.     Cervical back: Normal range of motion.  Skin:    General: Skin is  warm.  Neurological:     General: No focal deficit  present.     Mental Status: Lori Lang is alert and oriented to person, place, and time.  Psychiatric:        Behavior: Behavior normal.        Judgment: Judgment normal.     LABORATORY DATA:  I have reviewed the data as listed Lab Results  Component Value Date   WBC 4.4 04/13/2023   HGB 14.6 04/13/2023   HCT 44.2 04/13/2023   MCV 91.9 04/13/2023   PLT 285.0 04/13/2023   Recent Labs    11/06/22 0010 12/23/22 0503 12/31/22 2257 04/13/23 0831  NA 138 138 136 141  K 3.5 3.2* 4.2 3.8  CL 103 106 100 107  CO2 24 26 25 26   GLUCOSE 95 104* 112* 80  BUN 18 15 15 18   CREATININE 0.73 0.64 0.76 0.70  CALCIUM  9.1 8.8* 9.1 9.1  GFRNONAA >60 >60 >60  --   PROT 7.5 7.2 9.2* 6.9  ALBUMIN 3.9 3.7 4.3 4.0  AST 132* 280* 28 19  ALT 65* 191* 38 29  ALKPHOS 88 113 95 86  BILITOT 0.9 1.1 1.3* 0.4    RADIOGRAPHIC STUDIES: I have personally reviewed the radiological images as listed and agreed with the findings in the report. CT SOFT TISSUE NECK W CONTRAST Result Date: 08/14/2023 CLINICAL DATA:  Non pulsatile neck mass.  Right lateral neck mass EXAM: CT NECK WITH CONTRAST TECHNIQUE: Multidetector CT imaging of the neck was performed using the standard protocol following the bolus administration of intravenous contrast. RADIATION DOSE REDUCTION: This exam was performed according to the departmental dose-optimization program which includes automated exposure control, adjustment of the mA and/or kV according to patient size and/or use of iterative reconstruction technique. CONTRAST:  75mL ISOVUE-300 IOPAMIDOL (ISOVUE-300) INJECTION 61% COMPARISON:  None Available. FINDINGS: Pharynx and larynx: Small presumed retention cyst in the nasopharyngeal adenoid. Mild thickening of the palatine tonsils. No focal mass or abnormal enhancement Salivary glands: No inflammation, mass, or stone. Thyroid : Small nodules measuring up to 7 mm on the right, not meeting size threshold for sonographic follow-up. Lymph nodes:  Palpable marker correlates with the posterior triangle enlarged lymph node on the right which measures 14 mm length. Palpable marker on the left likewise relates to an enlarged lymph node measuring 14 mm in length. Diffuse mild prominence of cervical lymph nodes with homogeneous density. Vascular: Mild atheromatous calcification. Limited intracranial: Unremarkable Visualized orbits: Unremarkable Mastoids and visualized paranasal sinuses: Clear Skeleton: Unremarkable Upper chest: Clear apical lungs IMPRESSION: Palpable markers correlate with mildly enlarged lymph nodes - generalized mild cervical adenopathy. Findings could relate to systemic infection (such is URI if acute or HIV of chronic), granulomatous disease, or lymphoproliferative disease. Electronically Signed   By: Ronnette Coke M.D.   On: 08/14/2023 09:50   XR Hand Complete Right Result Date: 08/05/2023 X-rays of the right  hand, focused on the index DIP region were performed today There is no evidence of fracture or dislocation. Soft tissues are unremarkable. 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  XR Hand Complete Left Result Date: 08/05/2023 X-rays of the left hand, focused on the index DIP region were performed today X-rays demonstrate arthritic changes at the DIP of the index finger with joint space narrowing and dorsal osteophyte formation.  There is a calcification present over the dorsal aspect of the DIP joint as well.  There is some DIP arthritis seen diffusely throughout the small  joints.    Lymphadenopathy of head and neck region MAY 23rd, 2025- Lymph nodes: Palpable marker correlates with the posterior triangle enlarged lymph node on the right which measures 14 mm length. Palpable marker on the left likewise relates to an enlarged lymph node measuring 14 mm in length. Diffuse mild prominence of cervical lymph nodes with homogeneous density.   # On clinical exam no lymphadenopathy anywhere else.   I reviewed the differential diagnosis of large lymph nodes includes infection-autoimmune conditions, possible malignancy.  Low concern for malignancy-except for drenching night sweats. For now recommend CBC CMP LDH; EBV antibody/PCR.  Hepatitis/HIV workup.   # Discussed that we will reevaluate the patient next few weeks-and then if above infectious workup is negative and continues to have swollen lymph nodes recommend a biopsy.  Patient is in agreement.   Thank you Ms. Arnett for allowing me to participate in the care of your pleasant patient. Please do not hesitate to contact me with questions or concerns in the interim.   # I reviewed the blood work- with the patient in detail; also reviewed the imaging independently [as summarized above]; and with the patient in detail.  My contact information was given to the patient.   mychart # DISPOSITION: # labs today- ordered # follow up in 4  weeks- MD; no labs- Dr.B  Above plan of care was discussed with patient/family in detail.  My contact information was given to the patient/family.       Gwyn Leos, MD 08/19/2023 3:13 PM

## 2023-08-19 NOTE — Assessment & Plan Note (Addendum)
 MAY 23rd, 2025- Lymph nodes: Palpable marker correlates with the posterior triangle enlarged lymph node on the right which measures 14 mm length. Palpable marker on the left likewise relates to an enlarged lymph node measuring 14 mm in length. Diffuse mild prominence of cervical lymph nodes with homogeneous density.   # On clinical exam no lymphadenopathy anywhere else.  I reviewed the differential diagnosis of large lymph nodes includes infection-autoimmune conditions, possible malignancy.  Low concern for malignancy-except for drenching night sweats. For now recommend CBC CMP LDH; EBV antibody/PCR.  Hepatitis/HIV workup.   # Discussed that we will reevaluate the patient next few weeks-and then if above infectious workup is negative and continues to have swollen lymph nodes recommend a biopsy.  Patient is in agreement.   Thank you Ms. Arnett for allowing me to participate in the care of your pleasant patient. Please do not hesitate to contact me with questions or concerns in the interim.   # I reviewed the blood work- with the patient in detail; also reviewed the imaging independently [as summarized above]; and with the patient in detail.  My contact information was given to the patient.   mychart # DISPOSITION: # labs today- ordered # follow up in 4  weeks- MD; no labs- Dr.B

## 2023-08-20 LAB — EPSTEIN-BARR VIRUS (EBV) ANTIBODY PROFILE
EBV NA IgG: 123 U/mL — ABNORMAL HIGH (ref 0.0–17.9)
EBV VCA IgG: >600 U/mL — ABNORMAL HIGH (ref 0.0–17.9)
EBV VCA IgM: <36 U/mL (ref 0.0–35.9)

## 2023-08-20 LAB — EPSTEIN BARR VRS(EBV DNA BY PCR): EBV DNA QN by PCR: NEGATIVE [IU]/mL

## 2023-08-20 NOTE — Telephone Encounter (Signed)
 Pt notified pt stated that she is doing trh 1mg  for 4 weeks and wanted to go ahead and place the 2mg  order so the pharmacy could have it in stock. Pt denied any GI issues at this time

## 2023-08-21 ENCOUNTER — Other Ambulatory Visit: Payer: Self-pay

## 2023-08-21 NOTE — Telephone Encounter (Signed)
 Noted  Ozempic  2mg  previously sent

## 2023-08-27 ENCOUNTER — Other Ambulatory Visit: Payer: Self-pay

## 2023-08-28 ENCOUNTER — Other Ambulatory Visit: Payer: Self-pay

## 2023-09-03 ENCOUNTER — Ambulatory Visit: Admitting: Orthopedic Surgery

## 2023-09-09 ENCOUNTER — Ambulatory Visit: Admitting: Nurse Practitioner

## 2023-09-09 ENCOUNTER — Ambulatory Visit: Payer: Self-pay | Admitting: Nurse Practitioner

## 2023-09-09 VITALS — BP 108/70 | HR 73 | Temp 97.8°F | Ht 65.0 in | Wt 165.4 lb

## 2023-09-09 DIAGNOSIS — B009 Herpesviral infection, unspecified: Secondary | ICD-10-CM | POA: Diagnosis not present

## 2023-09-09 DIAGNOSIS — E119 Type 2 diabetes mellitus without complications: Secondary | ICD-10-CM

## 2023-09-09 DIAGNOSIS — R590 Localized enlarged lymph nodes: Secondary | ICD-10-CM | POA: Diagnosis not present

## 2023-09-09 DIAGNOSIS — E785 Hyperlipidemia, unspecified: Secondary | ICD-10-CM

## 2023-09-09 DIAGNOSIS — D751 Secondary polycythemia: Secondary | ICD-10-CM | POA: Diagnosis not present

## 2023-09-09 DIAGNOSIS — Z7985 Long-term (current) use of injectable non-insulin antidiabetic drugs: Secondary | ICD-10-CM

## 2023-09-09 LAB — MICROALBUMIN / CREATININE URINE RATIO
Creatinine,U: 274.1 mg/dL
Microalb Creat Ratio: 5 mg/g (ref 0.0–30.0)
Microalb, Ur: 1.4 mg/dL (ref 0.0–1.9)

## 2023-09-09 LAB — POCT GLYCOSYLATED HEMOGLOBIN (HGB A1C)
HbA1c POC (<> result, manual entry): 5.2 % (ref 4.0–5.6)
HbA1c, POC (controlled diabetic range): 5.2 % (ref 0.0–7.0)
HbA1c, POC (prediabetic range): 5.2 % — AB (ref 5.7–6.4)
Hemoglobin A1C: 5.2 % (ref 4.0–5.6)

## 2023-09-09 NOTE — Progress Notes (Unsigned)
  Bluford Burkitt, NP-C Phone: [phone]  Lori Lang is a 53 y.o. female who presents today for follow up.   ***  Social History   Tobacco Use  Smoking Status Every Day   Current packs/day: 1.00   Average packs/day: 1 pack/day for 30.0 years (30.0 ttl pk-yrs)   Types: Cigarettes  Smokeless Tobacco Never  Tobacco Comments   0.75PPD 05/01/2021    Current Outpatient Medications on File Prior to Visit  Medication Sig Dispense Refill   cyclobenzaprine  (FLEXERIL ) 5 MG tablet Take 1 tablet (5 mg total) by mouth at bedtime. 30 tablet 0   Semaglutide , 2 MG/DOSE, (OZEMPIC , 2 MG/DOSE,) 8 MG/3ML SOPN Inject 2 mg into the skin once a week. 3 mL 2   No current facility-administered medications on file prior to visit.     ROS see history of present illness  Objective  Physical Exam Vitals:   09/09/23 0831  BP: 108/70  Pulse: 73  Temp: 97.8 F (36.6 C)  SpO2: 93%    BP Readings from Last 3 Encounters:  09/09/23 108/70  08/19/23 138/85  08/05/23 120/78   Wt Readings from Last 3 Encounters:  09/09/23 165 lb 6.4 oz (75 kg)  08/19/23 167 lb (75.8 kg)  08/05/23 169 lb 12.8 oz (77 kg)    Physical Exam Constitutional:      General: She is not in acute distress.    Appearance: Normal appearance.  HENT:     Head: Normocephalic.   Cardiovascular:     Rate and Rhythm: Normal rate and regular rhythm.     Heart sounds: Normal heart sounds.  Pulmonary:     Effort: Pulmonary effort is normal.     Breath sounds: Normal breath sounds.   Skin:    General: Skin is warm and dry.   Neurological:     General: No focal deficit present.     Mental Status: She is alert.   Psychiatric:        Mood and Affect: Mood normal.        Behavior: Behavior normal.      Assessment/Plan: Please see individual problem list.  Type 2 diabetes mellitus without complication, without long-term current use of insulin (HCC) -     POCT glycosylated hemoglobin (Hb A1C) -     Microalbumin /  creatinine urine ratio  Lymphadenopathy of head and neck region  Hyperlipidemia, unspecified hyperlipidemia type     Return in about 6 months (around 03/10/2024) for Follow up.   Bluford Burkitt, NP-C [facility]

## 2023-09-15 ENCOUNTER — Encounter: Payer: Self-pay | Admitting: Nurse Practitioner

## 2023-09-15 DIAGNOSIS — B009 Herpesviral infection, unspecified: Secondary | ICD-10-CM | POA: Insufficient documentation

## 2023-09-15 NOTE — Assessment & Plan Note (Signed)
 A CT scan showed enlarged cervical lymph nodes. The differential diagnosis includes reactive lymphadenopathy due to EBV or other inflammatory processes, with a low risk of malignancy per specialist evaluation. A biopsy may be considered if further tests are inconclusive. Follow up with Hematology/Oncology as scheduled.

## 2023-09-15 NOTE — Assessment & Plan Note (Signed)
 The first outbreak was treated and resolved. HSV can remain dormant and cause future outbreaks. She has past exposure to HSV-1 and HSV-2. Discussed triggers and suppressive therapy if frequent outbreaks occur. Monitor for future outbreaks and consider suppressive therapy if they occur frequently.

## 2023-09-15 NOTE — Assessment & Plan Note (Signed)
 Polycythemia is likely secondary to smoking, indicated by an elevated red blood cell count. The current level is not concerning but requires monitoring. Advise smoking cessation and monitor red blood cell count.

## 2023-09-15 NOTE — Assessment & Plan Note (Signed)
 She is on Ozempic  2 mg with good glycemic control, A1c was 5.0 in January, and she has no hyperglycemia symptoms. There is concern about the lack of significant weight loss with Ozempic . Check A1c today and perform a urine diabetic kidney evaluation. Continue Ozempic  2 mg. Encourage healthy diet and regular exercise.

## 2023-09-16 ENCOUNTER — Encounter: Payer: Self-pay | Admitting: Internal Medicine

## 2023-09-16 ENCOUNTER — Inpatient Hospital Stay: Attending: Internal Medicine | Admitting: Internal Medicine

## 2023-09-16 ENCOUNTER — Encounter: Admitting: Orthopedic Surgery

## 2023-09-16 DIAGNOSIS — R590 Localized enlarged lymph nodes: Secondary | ICD-10-CM | POA: Diagnosis not present

## 2023-09-16 NOTE — Progress Notes (Signed)
 I connected with SANNA PORCARO on 09/16/23 at  3:00 PM EDT by video enabled telemedicine visit and verified that I am speaking with the correct person using two identifiers.  I discussed the limitations, risks, security and privacy concerns of performing an evaluation and management service by telemedicine and the availability of in-person appointments. I also discussed with the patient that there may be a patient responsible charge related to this service. The patient expressed understanding and agreed to proceed.    Other persons participating in the visit and their role in the encounter: RN/medical reconciliation Patient's location: office Provider's location: home  Oncology History   No history exists.     Chief Complaint: Lymphadenopathy.   History of present illness:Lori Lang 53 y.o.  female with history of lymphadenopathy is here to review results of her blood work.   Patient states her lymph nodes have not progressed.  She denies any pain.  Also notes that her night sweats have resolved.  Otherwise no fever no chills.  Otherwise physically active.   Observation/objective: Alert & oriented x 3. In No acute distress.   Assessment and plan: Lymphadenopathy of head and neck region MAY 23rd, 2025- CT: Lymph nodes: Palpable marker correlates with the posterior triangle enlarged lymph node on the right which measures 14 mm length. Palpable marker on the left likewise relates to an enlarged lymph node measuring 14 mm in length. Diffuse mild prominence of cervical lymph nodes with homogeneous density. CBC CMP LDH; EBV antibody- IgG elevated. Hepatitis/HIV workup- NEGATIVE   # On clinical exam no lymphadenopathy anywhere else.  I reviewed the differential diagnosis of large lymph nodes includes infection-autoimmune conditions, possible malignancy. # Low concern for malignancy-except for drenching night sweats-however currently resolved.  Discussed with patient-recommend lymph node  biopsy. Also discussed with radiology, Dr.Henn.   [place] # DISPOSITION: #  US  guided right side of the neck LN biopsy- please order # follow up 1 week or so after the lymph node Biopsy-  MD; no labs- Dr.B  Follow-up instructions:  I discussed the assessment and treatment plan with the patient.  The patient was provided an opportunity to ask questions and all were answered.  The patient agreed with the plan and demonstrated understanding of instructions.  The patient was advised to call back or seek an in person evaluation if the symptoms worsen or if the condition fails to improve as anticipated.    Dr. Abria Vannostrand [facility] at [facility] 09/16/2023 3:32 PM

## 2023-09-16 NOTE — Progress Notes (Signed)
 No concerns today

## 2023-09-16 NOTE — Assessment & Plan Note (Addendum)
 MAY 23rd, 2025- CT: Lymph nodes: Palpable marker correlates with the posterior triangle enlarged lymph node on the right which measures 14 mm length. Palpable marker on the left likewise relates to an enlarged lymph node measuring 14 mm in length. Diffuse mild prominence of cervical lymph nodes with homogeneous density. CBC CMP LDH; EBV antibody- IgG elevated. Hepatitis/HIV workup- NEGATIVE   # On clinical exam no lymphadenopathy anywhere else.  I reviewed the differential diagnosis of large lymph nodes includes infection-autoimmune conditions, possible malignancy. # Low concern for malignancy-except for drenching night sweats-however currently resolved.  Discussed with patient-recommend lymph node biopsy. Also discussed with radiology, Dr.Henn.   Lori Lang # DISPOSITION: #  US  guided right side of the neck LN biopsy- please order # follow up 1 week or so after the lymph node Biopsy-  MD; no labs- Dr.B

## 2023-09-16 NOTE — Addendum Note (Signed)
 Addended by: LAEL BROWNING A on: 09/16/2023 03:37 PM   Modules accepted: Orders

## 2023-09-18 ENCOUNTER — Telehealth: Payer: Self-pay | Admitting: *Deleted

## 2023-09-18 NOTE — Telephone Encounter (Signed)
 Phone call to patient regarding for U/S guided biopsy . They can put her on Thurs 7/3 at 1:30p an arrive at 1p. She can drive herself, does not have to be NPO, but she will check in at the [facility] registration desk . Patient has agreed to this appointment.

## 2023-09-18 NOTE — Telephone Encounter (Signed)
 Patient needs us  biopsy. Per [facility]- patient can be scheduled on Thurs 7/3 at 1:30p an [facility] at 1p. She can drive herself, does not have to be NPO.    I attempted to reach patient. I called her left a vm- requesting a call back. Mychart msg sent

## 2023-09-22 ENCOUNTER — Encounter: Admitting: Orthopedic Surgery

## 2023-09-23 NOTE — Progress Notes (Signed)
 Patient for US  guided Core RT cervical LN Biopsy on Thurs 09/24/23, I called and spoke with the patient on the phone and gave pre-procedure instructions. Pt was made aware to be here at 1p and check in at the [facility] registration desk. Pt stated understanding.  Called 09/23/23

## 2023-09-23 NOTE — Progress Notes (Signed)
 Luverne Aran, MD sent to [facility] PROCEDURE / BIOPSY REVIEW Date: 09/17/23  Requested Biopsy site: Posterior right cervical lymph node Reason for request: Lymphadenopathy Imaging review: Best seen on Neck CT  Decision: Approved Imaging modality to perform: Ultrasound Schedule with: Local Schedule for: Any VIR  Additional comments:   Please contact me with questions, concerns, or if issue pertaining to this request arise.  Aran ONEIDA Luverne, MD [facility]

## 2023-09-24 ENCOUNTER — Encounter: Admitting: Orthopedic Surgery

## 2023-09-24 ENCOUNTER — Ambulatory Visit
Admission: RE | Admit: 2023-09-24 | Discharge: 2023-09-24 | Disposition: A | Discharge status: Home / Self Care | Source: Ambulatory Visit | Attending: Internal Medicine | Admitting: Internal Medicine

## 2023-09-24 DIAGNOSIS — R590 Localized enlarged lymph nodes: Secondary | ICD-10-CM | POA: Insufficient documentation

## 2023-09-24 MED ORDER — LIDOCAINE HCL (PF) 1 % IJ SOLN
10.0000 mL | Freq: Once | INTRAMUSCULAR | Status: AC
  Administered 2023-09-24: 10 mL
  Filled 2023-09-24: qty 10

## 2023-09-24 NOTE — CV Procedure (Signed)
 Interventional Radiology Procedure Note  Procedure: U/S Guided Biopsy of right neck lymph node  Complications: None  Estimated Blood Loss: < 10 mL  Findings: 18 G core biopsy of lymph node performed under U/S guidance.  2 core samples obtained and sent to Pathology.  Cordella DELENA Banner, MD

## 2023-09-29 ENCOUNTER — Other Ambulatory Visit: Payer: Self-pay | Admitting: Orthopedic Surgery

## 2023-09-29 ENCOUNTER — Ambulatory Visit: Admitting: Orthopedic Surgery

## 2023-09-29 DIAGNOSIS — M67442 Ganglion, left hand: Secondary | ICD-10-CM | POA: Diagnosis not present

## 2023-09-29 NOTE — Progress Notes (Deleted)
   Procedure Note  Patient: Lori Lang             Date of Birth: February 25, 1971           MRN: [id number]             Visit Date: 09/29/2023  Procedures: Visit Diagnoses: No diagnosis found.  No procedures performed

## 2023-09-29 NOTE — Progress Notes (Deleted)
 NAME: Lori Lang: [id number] DATE OF BIRTH: 1970-06-03 FACILITY: [facility]  LOCATION: [facility] PHYSICIAN: GILDARDO ALDERTON, MD   OPERATIVE REPORT   DATE OF PROCEDURE: 09/29/23    PREOPERATIVE DIAGNOSIS: Left index finger DIP mucous cyst   POSTOPERATIVE DIAGNOSIS: Left index finger DIP mucous cyst   PROCEDURE: Left index finger DIP mucous cyst excision Left index finger DIP joint debridement Left index finger skin flap advancement, 3 cm x 1.5 cm   SURGEON:  GILDARDO ALDERTON, M.D.   ASSISTANT: Almeda Rummer, PA   ANESTHESIA:  Local   ESTIMATED BLOOD LOSS:  Minimal.   COMPLICATIONS:  None.   SPECIMENS: Specimen sent for permanent pathology   TOURNIQUET TIME: Turnicot utilized, 15 minutes   DISPOSITION:  Stable to PACU.   INDICATIONS: 53 year old female with left index finger DIP mucous cyst with underlying DIP joint arthritis.  Patient is indicated for left index finger DIP mucous cyst excision given her ongoing pain in this region.  We discussed underlying arthritic nature of the joint and the need for DIP joint debridement.  We also discussed possibility of skin flap advancement for coverage.  We did discuss the possibility of recurrence given the underlying DIP joint arthritis and the possible need for DIP joint fusion in the future.  Patient expressed understanding.  Risks and benefits of surgery were discussed including the risks of infection, bleeding, scarring, stiffness, nerve injury, vascular injury, tendon injury, need for subsequent operation, , recurrence.  She voiced understanding of these risks and elected to proceed.  OPERATIVE COURSE: Patient was seen and identified and marked appropriately.  Surgical consent had been signed. She was transferred to the procedure room and placed in supine position with the left upper extremity on an arm board.  Left upper extremity was prepped and draped in normal sterile orthopedic fashion.  10 cc of  1% lidocaine  plain was utilized for digital block purposes.  A surgical pause was performed between the surgeon and staff, all were in agreement as to the patient, procedure, and site of procedure.  Turnicot was placed and padded appropriately for the left index finger.  Excision of the mucous cyst was performed.  An L-shaped incision was designed over the dorsum of the finger.  An ellipse was designed around the mucous cyst.  The incision was taken down to subcutaneous tissues.  The extensor mechanism was identified and carefully protected.  The mucous cyst was then sharply excised and sent to surgical otology as a permanent specimen.  Following excision of the cyst, a DIP joint arthrotomy was performed both radially and ulnarly.  The DIP joint osteophytes were carefully debrided utilizing a rondure.  This was completed to reduce risk of cyst recurrence.  Finally, the skin flap was elevated from the extensor mechanism.  Care was taken avoid injury to the extensor peritenon.  The skin flap itself measured approximately 1.5 cm x 3 cm in dimension.  Once the flap was elevated, was advanced distally into the mucous cyst excision defect.  Turnicot was then removed.  Copious irrigation was performed.  Skin closure was performed in standard fashion utilizing 4-0 nylon.  Sterile dressings were applied.  Bulky dressing was performed around the index finger, care was taken to maintain extension at the DIP joint.  Sponge and needle counts were correct at the end of the case.  I was present throughout the entire case and performed all aspects of it.  Post-operative plan: The patient will recover in the post-seizure care  unit and then be discharged home.  The patient will be non weight bearing on the left upper extremity in a soft dressing.   I will see the patient back in the office in 1 week for postoperative followup.    Nason Conradt, MD Electronically signed, 09/29/23

## 2023-09-29 NOTE — Progress Notes (Signed)
 Procedure Note  Patient: Lori Lang             Date of Birth: 1970/07/02           MRN: [id number]             Visit Date: 09/29/2023  Procedures: Visit Diagnoses:  1. Digital mucous cyst of finger of left hand     NAME: Lori Lang MEDICAL RECORD NO: [id number] DATE OF BIRTH: 1970/05/27 FACILITY: [facility]  LOCATION: [facility] PHYSICIAN: GILDARDO ALDERTON, MD   OPERATIVE REPORT   DATE OF PROCEDURE: 09/29/23    PREOPERATIVE DIAGNOSIS: Left index finger DIP mucous cyst   POSTOPERATIVE DIAGNOSIS: Left index finger DIP mucous cyst   PROCEDURE: Left index finger DIP mucous cyst excision Left index finger DIP joint debridement Left index finger skin flap advancement, 3 cm x 1.5 cm   SURGEON:  GILDARDO ALDERTON, M.D.   ASSISTANT: Almeda Rummer, PA   ANESTHESIA:  Local   ESTIMATED BLOOD LOSS:  Minimal.   COMPLICATIONS:  None.   SPECIMENS: Specimen sent for permanent pathology   TOURNIQUET TIME: Turnicot utilized, 15 minutes   DISPOSITION:  Stable to PACU.   INDICATIONS: 53 year old female with left index finger DIP mucous cyst with underlying DIP joint arthritis.  Patient is indicated for left index finger DIP mucous cyst excision given her ongoing pain in this region.  We discussed underlying arthritic nature of the joint and the need for DIP joint debridement.  We also discussed possibility of skin flap advancement for coverage.  We did discuss the possibility of recurrence given the underlying DIP joint arthritis and the possible need for DIP joint fusion in the future.  Patient expressed understanding.  Risks and benefits of surgery were discussed including the risks of infection, bleeding, scarring, stiffness, nerve injury, vascular injury, tendon injury, need for subsequent operation, , recurrence.  She voiced understanding of these risks and elected to proceed.  OPERATIVE COURSE: Patient was seen and identified and marked appropriately.  Surgical  consent had been signed. She was transferred to the procedure room and placed in supine position with the left upper extremity on an arm board.  Left upper extremity was prepped and draped in normal sterile orthopedic fashion.  10 cc of 1% lidocaine  plain was utilized for digital block purposes.  A surgical pause was performed between the surgeon and staff, all were in agreement as to the patient, procedure, and site of procedure.  Turnicot was placed and padded appropriately for the left index finger.  Excision of the mucous cyst was performed.  An L-shaped incision was designed over the dorsum of the finger.  An ellipse was designed around the mucous cyst.  The incision was taken down to subcutaneous tissues.  The extensor mechanism was identified and carefully protected.  The mucous cyst was then sharply excised and sent to surgical otology as a permanent specimen.  Following excision of the cyst, a DIP joint arthrotomy was performed both radially and ulnarly.  The DIP joint osteophytes were carefully debrided utilizing a rondure.  This was completed to reduce risk of cyst recurrence.  Finally, the skin flap was elevated from the extensor mechanism.  Care was taken avoid injury to the extensor peritenon.  The skin flap itself measured approximately 1.5 cm x 3 cm in dimension.  Once the flap was elevated, was advanced distally into the mucous cyst excision defect.  Turnicot was then removed.  Copious irrigation was performed.  Skin closure was  performed in standard fashion utilizing 4-0 nylon.  Sterile dressings were applied.  Bulky dressing was performed around the index finger, care was taken to maintain extension at the DIP joint.  Sponge and needle counts were correct at the end of the case.  I was present throughout the entire case and performed all aspects of it.  Post-operative plan: The patient will recover in the post-seizure care unit and then be discharged home.  The patient will be non weight  bearing on the left upper extremity in a soft dressing.   I will see the patient back in the office in 1 week for postoperative followup.    Lori Ausley, MD Electronically signed, 09/29/23

## 2023-10-01 ENCOUNTER — Encounter: Payer: Self-pay | Admitting: Internal Medicine

## 2023-10-01 LAB — DERMATOLOGY PATHOLOGY

## 2023-10-01 LAB — SURGICAL PATHOLOGY

## 2023-10-06 ENCOUNTER — Ambulatory Visit (INDEPENDENT_AMBULATORY_CARE_PROVIDER_SITE_OTHER): Admitting: Orthopedic Surgery

## 2023-10-06 ENCOUNTER — Telehealth: Payer: Self-pay | Admitting: Internal Medicine

## 2023-10-06 DIAGNOSIS — M67442 Ganglion, left hand: Secondary | ICD-10-CM

## 2023-10-06 NOTE — Progress Notes (Unsigned)
   Lori Lang - 53 y.o. female MRN [id number]  Date of birth: 05-02-1970  Office Visit Note: Visit Date: 10/06/2023 PCP: Gretel App, NP Referred by: Gretel App, NP  Subjective:  HPI: Lori Lang is a 53 y.o. female who presents today for follow up 1 week status post mucous cyst removal.    Pertinent ROS were reviewed with the patient and found to be negative unless otherwise specified above in HPI.   Assessment & Plan: Visit Diagnoses: No diagnosis found.  Plan: ***  Follow-up: No follow-ups on file.   Meds & Orders: No orders of the defined types were placed in this encounter.  No orders of the defined types were placed in this encounter.    Procedures: No procedures performed       Objective:   Vital Signs: There were no vitals taken for this visit.  Ortho Exam ***  Imaging: No results found.   Lyndel Dancel Afton Alderton, M.D. [facility], Hand Surgery

## 2023-10-06 NOTE — Telephone Encounter (Signed)
 Returned patient's phone call-unable to reach the patient-left a voicemail no obvious evidence of malignancy-however could consider an excisional biopsy for further evaluation..   Recommend follow-up in the clinic in the next few weeks-discussed further options.  Please schedule an appointment-next available to review the result of biopsy- MD: no labs-  GB

## 2023-10-07 ENCOUNTER — Encounter: Admitting: Orthopedic Surgery

## 2023-10-13 ENCOUNTER — Encounter: Admitting: Orthopedic Surgery

## 2023-10-14 ENCOUNTER — Ambulatory Visit (INDEPENDENT_AMBULATORY_CARE_PROVIDER_SITE_OTHER): Admitting: Orthopedic Surgery

## 2023-10-14 DIAGNOSIS — M67442 Ganglion, left hand: Secondary | ICD-10-CM

## 2023-10-14 NOTE — Progress Notes (Signed)
   Lori Lang - 53 y.o. female MRN [id number]  Date of birth: [birth year]  Office Visit Note: Visit Date: 10/14/2023 PCP: Gretel App, NP Referred by: Gretel App, NP  Subjective:  HPI: Lori Lang is a 53 y.o. female who presents today for follow up 2 weeks status post left index finger mucous cyst removal.  She is doing very well overall, pleased with her outcome.  Pain is controlled.  Pertinent ROS were reviewed with the patient and found to be negative unless otherwise specified above in HPI.   Assessment & Plan: Visit Diagnoses: No diagnosis found.  Plan: She has done very well postoperatively.  Sutures removed today.  I did once again explained to her the possibility of recurrence with mucous cyst in this region.  She expressed understanding.  Return approxi-1 month or on an as-needed basis per patient preference.  Follow-up: No follow-ups on file.   Meds & Orders: No orders of the defined types were placed in this encounter.  No orders of the defined types were placed in this encounter.    Procedures: No procedures performed       Objective:   Vital Signs: There were no vitals taken for this visit.  Ortho Exam Left index finger: - Well-healed dorsal incision at the DIP region, skin edges well-approximated, sutures removed today, Steri-Strips applied - No evidence of cyst recurrence, DIP range of motion 0-45, normal: Capillary refill distally  Imaging: No results found.   Fedrick Cefalu Afton Alderton, M.D. [facility], Hand Surgery

## 2023-10-15 ENCOUNTER — Ambulatory Visit
Admission: RE | Admit: 2023-10-15 | Discharge: 2023-10-15 | Disposition: A | Discharge status: Home / Self Care | Source: Ambulatory Visit

## 2023-10-15 VITALS — BP 118/68 | HR 79 | Temp 98.1°F | Resp 18

## 2023-10-15 DIAGNOSIS — M542 Cervicalgia: Secondary | ICD-10-CM | POA: Diagnosis not present

## 2023-10-15 DIAGNOSIS — S46811A Strain of other muscles, fascia and tendons at shoulder and upper arm level, right arm, initial encounter: Secondary | ICD-10-CM

## 2023-10-15 MED ORDER — CYCLOBENZAPRINE HCL 10 MG PO TABS: 10.0000 mg | ORAL_TABLET | Freq: Every day | ORAL | 0 refills | Status: DC

## 2023-10-15 MED ORDER — PREDNISONE 10 MG (21) PO TBPK: ORAL_TABLET | Freq: Every day | ORAL | 0 refills | Status: DC

## 2023-10-15 NOTE — Discharge Instructions (Signed)
 Today you were evaluated for your neck pain which I believe is muscular based on your examination  starting tomorrow take prednisone  every morning with food as directed, may use Tylenol  or any topical medicines additionally but avoid ibuprofen   You may use muscle relaxant at bedtime as needed for additional comfort  You may use heating pad in 15 minute intervals as needed for additional comfort, within the first 2-3 days you may find comfort in using ice in 10-15 minutes over affected area  Begin stretching affected area daily for 10 minutes as tolerated to further loosen muscles   When lying down place pillow underneath underneath arms and behind the neck  Can try sleeping without pillow on firm mattress as this keeps the spine in alignment  Practice good posture: head back, shoulders back, chest forward, pelvis back and weight distributed evenly on both legs  If pain persist after recommended treatment or reoccurs if may be beneficial to follow up with orthopedic specialist for evaluation, this doctor specializes in the bones and can manage your symptoms long-term with options such as but not limited to imaging, medications or physical therapy
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
## 2023-10-21 IMAGING — US US ABDOMEN COMPLETE
1 series · 15 of 25 positions shown · non-contrast
Comparison: 08/26/2006 and CT 11/17/2018

CLINICAL DATA: Right upper quadrant pain.

EXAM:
ABDOMEN ULTRASOUND COMPLETE

[Series 1: us abdomen complete · 15 of 95 slices shown]
[im 1/95]
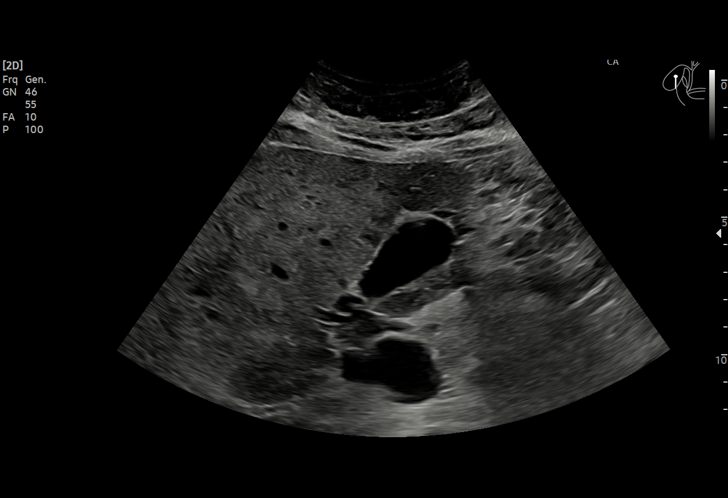
[im 8/95]
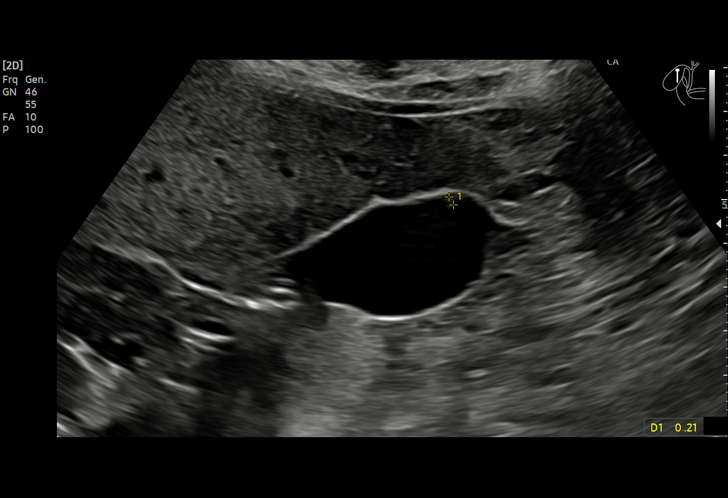
[im 16/95]
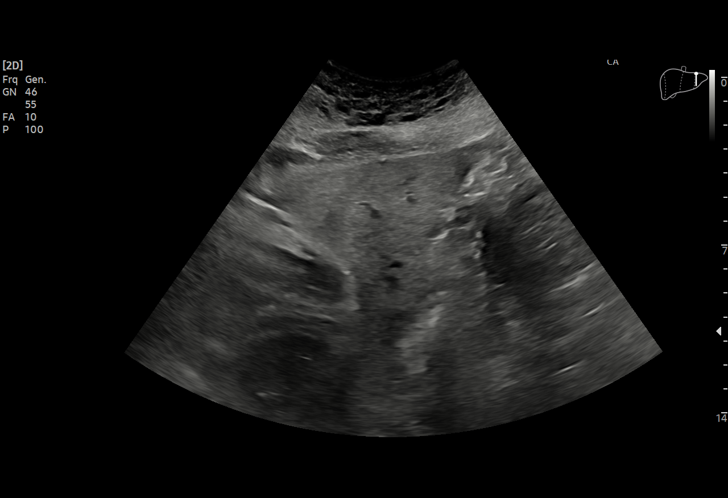
[im 20/95]
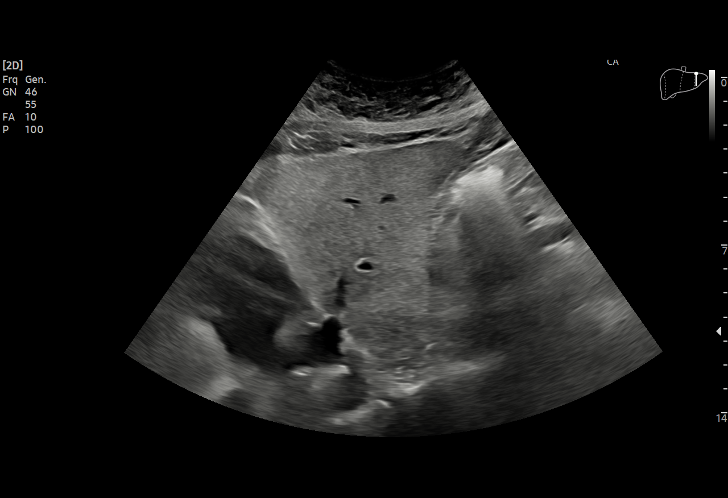
[im 28/95]
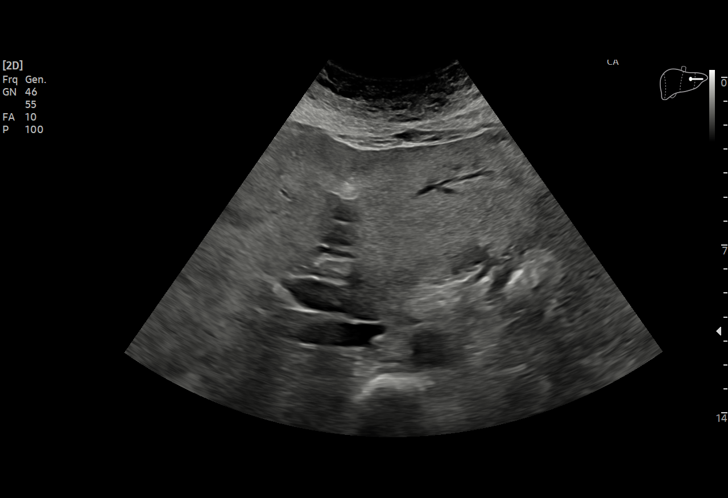
[im 36/95]
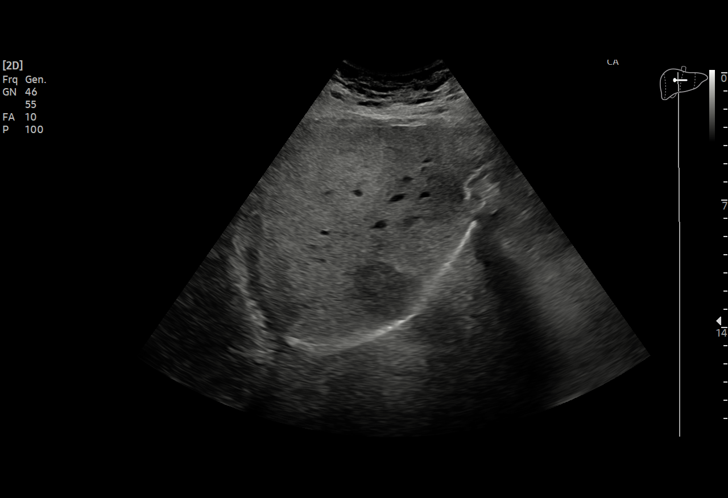
[im 40/95]
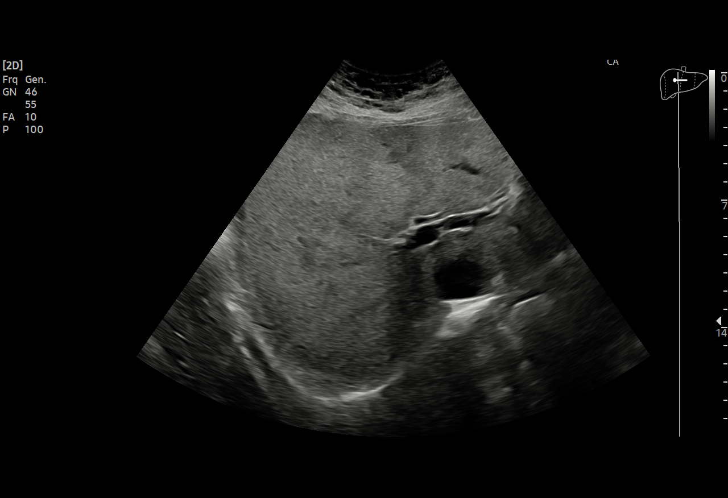
[im 48/95]
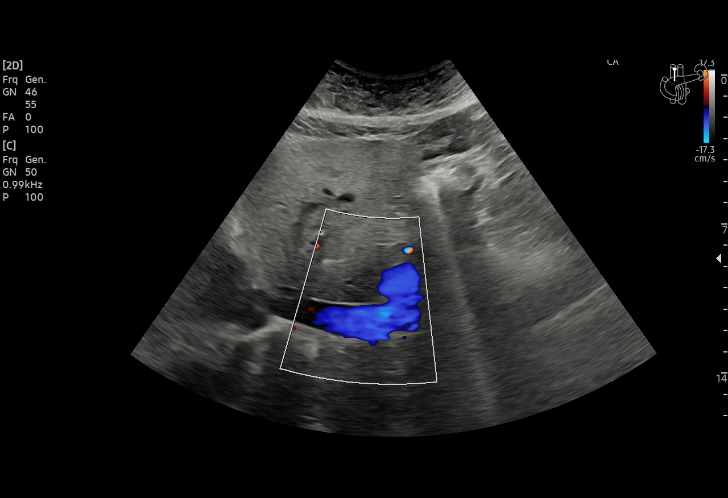
[im 55/95]
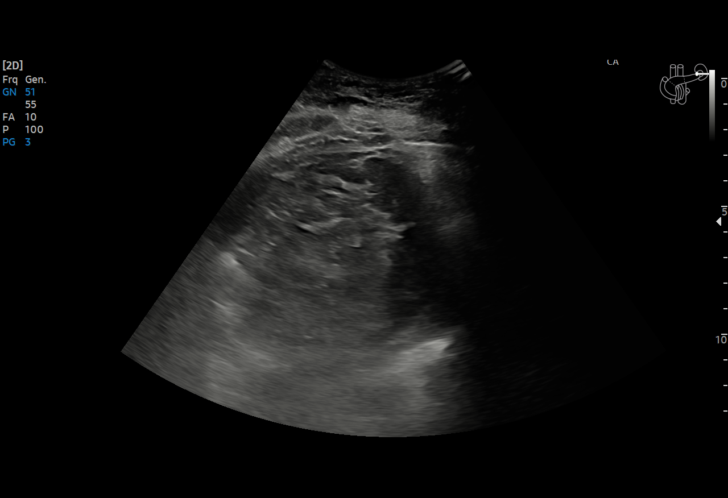
[im 59/95]
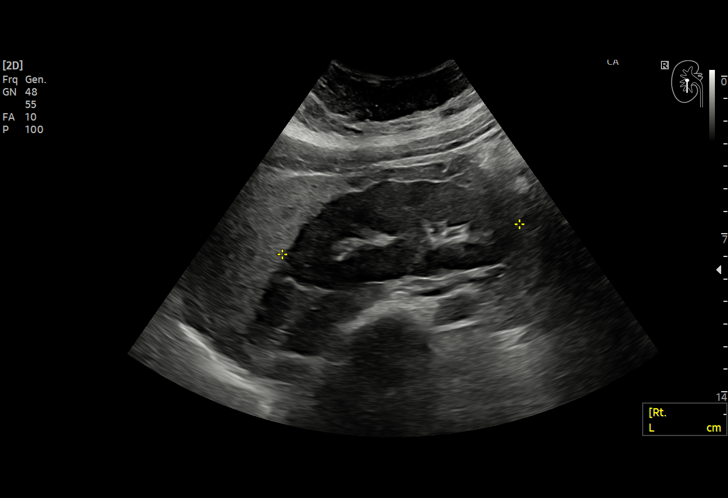
[im 67/95]
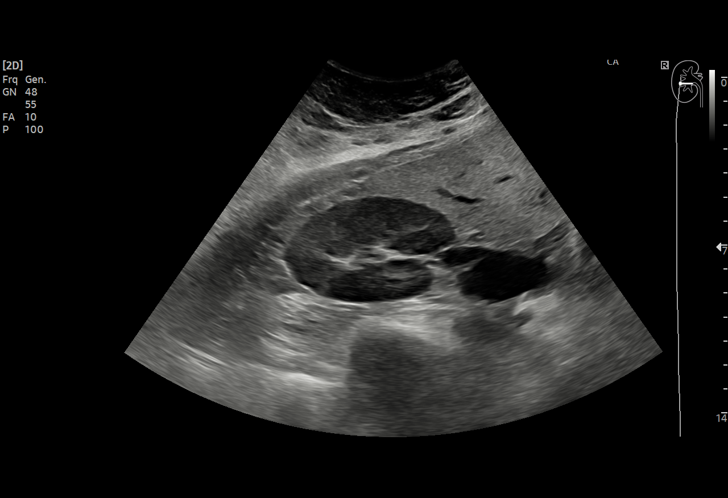
[im 75/95]
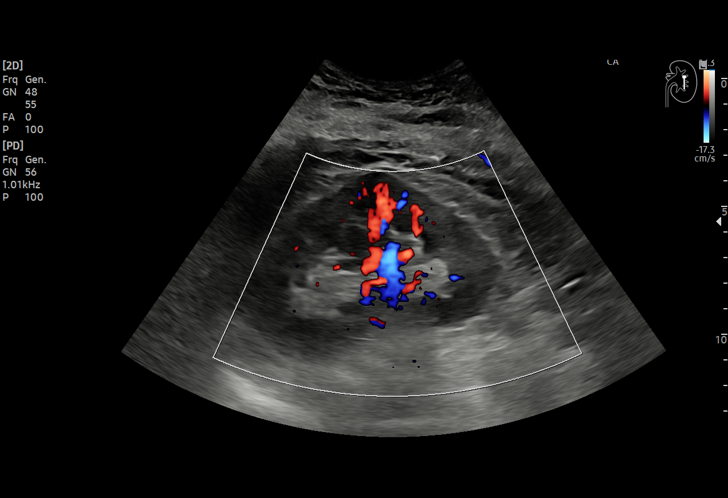
[im 79/95]
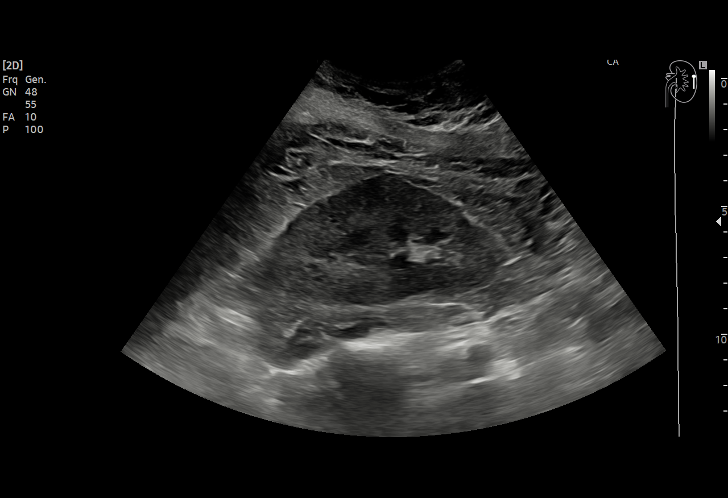
[im 87/95]
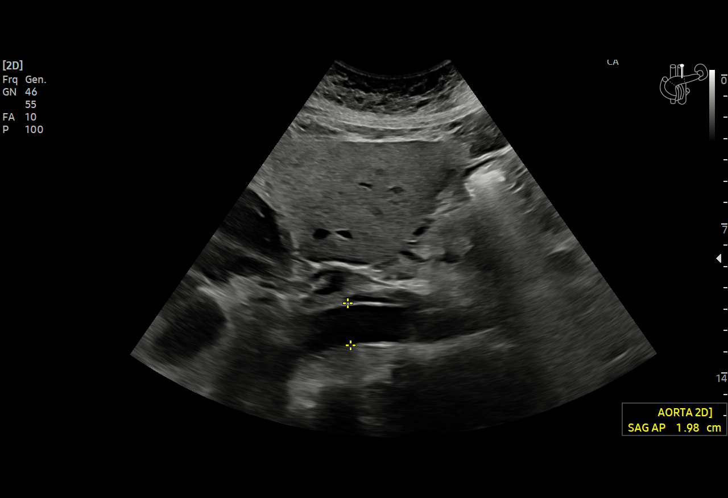
[im 95/95]
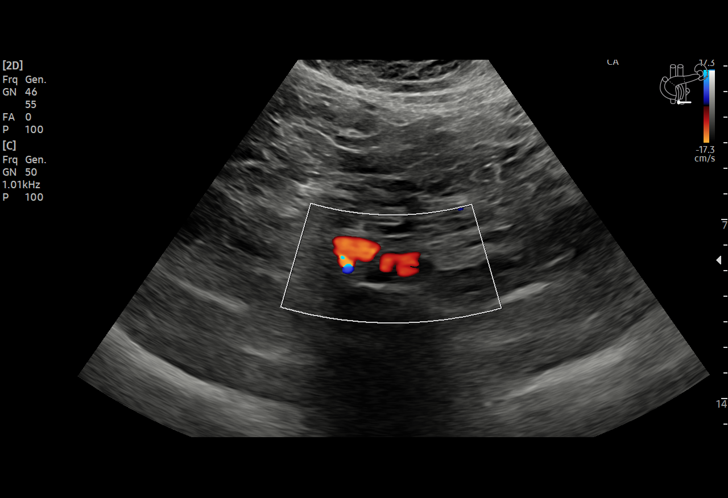

[15 of 25 positions shown; findings below may reference images not displayed]

FINDINGS: Gallbladder: No gallstones or wall thickening visualized. No
sonographic Murphy sign noted by sonographer. Single 2.3 mm adherent
echogenic focus over the gallbladder wall without shadowing likely a
small polyp.

Common bile duct: Diameter: 5.2 mm.

Liver: Diffuse increased parenchymal echogenicity compatible with a
degree of steatosis. 4.6 cm masslike hypoechoic abnormality over the
right lobe. Patient had a 4 cm enhancing mass over the dome of the
right lobe of the liver on previous CT from 0669 suggesting a
hemangioma as this may or may not represent the same current
abnormality seen today. Portal vein is patent on color Doppler
imaging with normal direction of blood flow towards the liver.

IVC: No abnormality visualized.

Pancreas: Visualized portion unremarkable.

Spleen: Size and appearance within normal limits.

Right Kidney: Length: 10.6 cm. Echogenicity within normal limits. No
mass or hydronephrosis visualized.

Left Kidney: Length: 10.0 cm. Echogenicity within normal limits. No
mass or hydronephrosis visualized.

Abdominal aorta: No aneurysm visualized.

Other findings: None.
IMPRESSION: 1.  No acute hepatobiliary findings.

2. Findings suggesting a degree of hepatic steatosis. 4.6 cm
masslike hypoechoic abnormality over the right lobe of the liver.
Recommend further evaluation via MRI on an elective basis.

## 2023-11-04 ENCOUNTER — Inpatient Hospital Stay: Admitting: Internal Medicine

## 2023-11-04 ENCOUNTER — Telehealth: Payer: Self-pay | Admitting: Internal Medicine

## 2023-11-04 NOTE — Telephone Encounter (Signed)
 Pt was scheduled for MD appt today 8/13. Pt r/s appt. Multiple dates and times were given to pt but the only one she could take was 9/5 due to work schedule.

## 2023-11-20 ENCOUNTER — Other Ambulatory Visit: Payer: Self-pay

## 2023-11-20 ENCOUNTER — Other Ambulatory Visit: Payer: Self-pay | Admitting: Family

## 2023-11-20 DIAGNOSIS — E119 Type 2 diabetes mellitus without complications: Secondary | ICD-10-CM

## 2023-11-20 MED ORDER — OZEMPIC (2 MG/DOSE) 8 MG/3ML ~~LOC~~ SOPN
2.0000 mg | PEN_INJECTOR | SUBCUTANEOUS | 2 refills | Status: DC
  Filled 2023-11-20: qty 3, 28d supply, fill #0
  Filled 2023-12-21 – 2023-12-22 (×2): qty 3, 28d supply, fill #1
  Filled 2024-01-28: qty 3, 28d supply, fill #2

## 2023-11-27 ENCOUNTER — Inpatient Hospital Stay: Attending: Internal Medicine | Admitting: Internal Medicine

## 2023-11-27 ENCOUNTER — Encounter: Payer: Self-pay | Admitting: Internal Medicine

## 2023-11-27 VITALS — BP 111/83 | HR 57 | Temp 96.7°F | Resp 18 | Ht 65.0 in | Wt 162.3 lb

## 2023-11-27 DIAGNOSIS — F17210 Nicotine dependence, cigarettes, uncomplicated: Secondary | ICD-10-CM | POA: Insufficient documentation

## 2023-11-27 DIAGNOSIS — R590 Localized enlarged lymph nodes: Secondary | ICD-10-CM | POA: Diagnosis not present

## 2023-11-27 DIAGNOSIS — Z8042 Family history of malignant neoplasm of prostate: Secondary | ICD-10-CM | POA: Insufficient documentation

## 2023-11-27 NOTE — Progress Notes (Signed)
 Bx 09/24/23 lymph node.

## 2023-11-27 NOTE — Progress Notes (Signed)
 [facility] CONSULT NOTE  Patient Care Team: Gretel App, NP as PCP - General (Nurse Practitioner) Rennie Cindy SAUNDERS, MD as Consulting Physician (Oncology)  CHIEF COMPLAINTS/PURPOSE OF CONSULTATION:  enlarged lymph nodes   Lymph nodes: Palpable marker correlates with the posterior triangle enlarged lymph node on the right which measures 14 mm length. Palpable marker on the left likewise relates to an enlarged lymph node measuring 14 mm in length. Diffuse mild prominence of cervical lymph nodes with homogeneous density.   Vascular: Mild atheromatous calcification.   Limited intracranial: Unremarkable   Visualized orbits: Unremarkable   Mastoids and visualized paranasal sinuses: Clear   Skeleton: Unremarkable   Upper chest: Clear apical lungs   IMPRESSION: Palpable markers correlate with mildly enlarged lymph nodes - generalized mild cervical adenopathy. Findings could relate to systemic infection (such is URI if acute or HIV of chronic), granulomatous disease, or lymphoproliferative disease.     Electronically Signed   By: Dorn Roulette M.D.   On: 08/14/2023 09:50  HISTORY OF PRESENTING ILLNESS: Patient ambulating-independently. Alone.   Lori Lang 53 y.o.  female pleasant patient with no prior history of any malignancy with neck  lymphadenopathy is here to review the results of lymph node biopsy.   Patient noted to have mild improvement of the neck lymphadenopathy.   Otherwise denies any weight loss.  Any fatigue.  Denies any unusual shortness of breath or cough or chest pain.  Patient denies any history of difficulty swallowing.  No hoarseness of voice.  Review of Systems  Constitutional:  Positive for diaphoresis. Negative for chills, fever, malaise/fatigue and weight loss.  HENT:  Negative for nosebleeds and sore throat.   Eyes:  Negative for double vision.  Respiratory:  Negative for cough, hemoptysis, sputum production, shortness of  breath and wheezing.   Cardiovascular:  Negative for chest pain, palpitations, orthopnea and leg swelling.  Gastrointestinal:  Negative for abdominal pain, blood in stool, constipation, diarrhea, heartburn, melena, nausea and vomiting.  Genitourinary:  Negative for dysuria, frequency and urgency.  Musculoskeletal:  Positive for joint pain. Negative for back pain.  Skin: Negative.  Negative for itching and rash.  Neurological:  Negative for dizziness, tingling, focal weakness, weakness and headaches.  Endo/Heme/Allergies:  Does not bruise/bleed easily.  Psychiatric/Behavioral:  Negative for depression. The patient is not nervous/anxious and does not have insomnia.     MEDICAL HISTORY:  Past Medical History:  Diagnosis Date   Chicken pox    COVID-19    03/2020, 03/2021   Family history of adverse reaction to anesthesia 1996   mother did not wake up   GERD (gastroesophageal reflux disease)    History of kidney stones    passed 3 , litrotrispy   Hyperlipidemia    Liver lesion    hemangioma vs FNH vs adenoma noted MRI 2008 (Dr. Jinny)    Pneumonia    Pre-diabetes    Prediabetes    Trigger finger of left thumb     SURGICAL HISTORY: Past Surgical History:  Procedure Laterality Date   ABDOMINAL HYSTERECTOMY     2006/2007 for endometriosis    BACK SURGERY     L4/5 discectomy 09/18/2015 Dr. Beuford    breast reduction     1994   EXTRACORPOREAL SHOCK WAVE LITHOTRIPSY Right 06/07/2015   Procedure: EXTRACORPOREAL SHOCK WAVE LITHOTRIPSY (ESWL);  Surgeon: Ozell SAUNDERS Burkes, MD;  Location: [facility];  Service: Urology;  Laterality: Right;   LAPAROTOMY  12/23/2022   Procedure: LAPAROTOMY;  Surgeon:  Jordis Laneta FALCON, MD;  Location: [facility];  Service: General;;   LYSIS OF ADHESION  12/23/2022   Procedure: LYSIS OF ADHESION;  Surgeon: Jordis Laneta FALCON, MD;  Location: [facility];  Service: General;;   RADIOLOGY WITH ANESTHESIA N/A 07/19/2015   Procedure: MRI LUMBER SPIN WITHOUT;  Surgeon: Medication  Radiologist, MD;  Location: [facility];  Service: Radiology;  Laterality: N/A;   RADIOLOGY WITH ANESTHESIA N/A 09/22/2017   Procedure: MRI LUMBAR SPINE WITHOUT CONTRAST;  Surgeon: Radiologist, Medication, MD;  Location: [facility] OR;  Service: Radiology;  Laterality: N/A;   RADIOLOGY WITH ANESTHESIA N/A 04/09/2021   Procedure: MRI LIVER WITH AND WITHOUT CONTRAST WITH ANESTHESIA;  Surgeon: Radiologist, Medication, MD;  Location: [facility];  Service: Radiology;  Laterality: N/A;   SHOULDER OPEN ROTATOR CUFF REPAIR Bilateral 2007 2008   right 2007, left 2008    TRIGGER FINGER RELEASE Left 02/18/2016   Procedure: LEFT TRIGGER THUMB RELEASE;  Surgeon: Alm Hummer, MD;  Location: [facility];  Service: Orthopedics;  Laterality: Left;   WISDOM TOOTH EXTRACTION      SOCIAL HISTORY: Social History   Socioeconomic History   Marital status: Married    Spouse name: Not on file   Number of children: Not on file   Years of education: Not on file   Highest education level: Some college, no degree  Occupational History   Not on file  Tobacco Use   Smoking status: Every Day    Current packs/day: 1.00    Average packs/day: 1 pack/day for 30.0 years (30.0 ttl pk-yrs)    Types: Cigarettes   Smokeless tobacco: Never   Tobacco comments:    0.75PPD 05/01/2021  Vaping Use   Vaping status: Never Used  Substance and Sexual Activity   Alcohol use: No   Drug use: No   Sexual activity: Yes  Other Topics Concern   Not on file  Social History Narrative   Married    2 kids son going to army   Daughter going to [employer] fall 2019    Owns guns    Wears seat belt    Safe in relationship    Social Drivers of Health   Financial Resource Strain: Low Risk  (09/09/2023)   Overall Financial Resource Strain (CARDIA)    Difficulty of Paying Living Expenses: Not very hard  Food Insecurity: No Food Insecurity (09/09/2023)   Hunger Vital Sign    Worried About Running Out of Food in the Last Year: Never true    Ran  Out of Food in the Last Year: Never true  Transportation Needs: No Transportation Needs (09/09/2023)   PRAPARE - Administrator, Civil Service (Medical): No    Lack of Transportation (Non-Medical): No  Physical Activity: Insufficiently Active (09/09/2023)   Exercise Vital Sign    Days of Exercise per Week: 2 days    Minutes of Exercise per Session: 30 min  Stress: No Stress Concern Present (09/09/2023)   [employer] of Occupational Health - Occupational Stress Questionnaire    Feeling of Stress: Only a little  Social Connections: Socially Integrated (09/09/2023)   Social Connection and Isolation Panel    Frequency of Communication with Friends and Family: More than three times a week    Frequency of Social Gatherings with Friends and Family: Once a week    Attends Religious Services: More than 4 times per year    Active Member of [employer] or Organizations: Yes    Attends Club or  Organization Meetings: 1 to 4 times per year    Marital Status: Married  Catering manager Violence: Not At Risk (12/23/2022)   Humiliation, Afraid, Rape, and Kick questionnaire    Fear of Current or Ex-Partner: No    Emotionally Abused: No    Physically Abused: No    Sexually Abused: No    FAMILY HISTORY: Family History  Problem Relation Age of Onset   Arthritis Mother    Early death Mother    Heart disease Mother        MVP with repair died from anestheisa    Hyperlipidemia Mother    Hypertension Mother    Kidney disease Mother    Pulmonary embolism Mother    Early death Father    Diabetes Father    Hyperlipidemia Father    Heart disease Father    Hypertension Father    Kidney disease Father    Mitral valve prolapse Father    Deep vein thrombosis Father    Pulmonary embolism Father        caused him to die   Diabetes Mellitus I Father    Kidney failure Father        s/p transplant   Diabetes Maternal Grandmother    Heart disease Maternal Grandmother    Heart disease Maternal  Grandfather    Early death Paternal Grandmother    Diabetes Paternal Grandmother    Cancer Paternal Grandfather        prostate    Asthma Daughter    Depression Daughter    Bipolar disorder Daughter    Thyroid  cancer Neg Hx     ALLERGIES:  has no known allergies.  MEDICATIONS:  Current Outpatient Medications  Medication Sig Dispense Refill   Cholecalciferol  (VITAMIN D3 PO) Take 1 tablet by mouth once a week.     cyclobenzaprine  (FLEXERIL ) 10 MG tablet Take 1 tablet (10 mg total) by mouth at bedtime. 10 tablet 0   Semaglutide , 2 MG/DOSE, (OZEMPIC , 2 MG/DOSE,) 8 MG/3ML SOPN Inject 2 mg into the skin once a week. 3 mL 2   No current facility-administered medications for this visit.    PHYSICAL EXAMINATION:   Vitals:   11/27/23 1002  BP: 111/83  Pulse: (!) 57  Resp: 18  Temp: (!) 96.7 F (35.9 C)  SpO2: 100%   Filed Weights   11/27/23 1002  Weight: 162 lb 4.8 oz (73.6 kg)   Improvement of bilateral neck adenopathy.  Physical Exam Vitals and nursing note reviewed.  HENT:     Head: Normocephalic and atraumatic.     Mouth/Throat:     Pharynx: Oropharynx is clear.  Eyes:     Extraocular Movements: Extraocular movements intact.     Pupils: Pupils are equal, round, and reactive to light.  Cardiovascular:     Rate and Rhythm: Normal rate and regular rhythm.  Pulmonary:     Comments: Decreased breath sounds bilaterally.  Abdominal:     Palpations: Abdomen is soft.  Musculoskeletal:        General: Normal range of motion.     Cervical back: Normal range of motion.  Skin:    General: Skin is warm.  Neurological:     General: No focal deficit present.     Mental Status: She is alert and oriented to person, place, and time.  Psychiatric:        Behavior: Behavior normal.        Judgment: Judgment normal.     LABORATORY DATA:  I  have reviewed the data as listed Lab Results  Component Value Date   WBC 4.3 08/19/2023   HGB 15.5 (H) 08/19/2023   HCT 45.2  08/19/2023   MCV 87.9 08/19/2023   PLT 297 08/19/2023   Recent Labs    12/23/22 0503 12/31/22 2257 04/13/23 0831 08/19/23 1520  NA 138 136 141 138  K 3.2* 4.2 3.8 3.4*  CL 106 100 107 103  CO2 26 25 26 27   GLUCOSE 104* 112* 80 80  BUN 15 15 18 15   CREATININE 0.64 0.76 0.70 0.86  CALCIUM  8.8* 9.1 9.1 8.9  GFRNONAA >60 >60  --  >60  PROT 7.2 9.2* 6.9 7.6  ALBUMIN 3.7 4.3 4.0 4.0  AST 280* 28 19 19   ALT 191* 38 29 21  ALKPHOS 113 95 86 81  BILITOT 1.1 1.3* 0.4 0.7    RADIOGRAPHIC STUDIES: I have personally reviewed the radiological images as listed and agreed with the findings in the report. No results found.    Lymphadenopathy of head and neck region MAY 23rd, 2025- CT: Lymph nodes: Palpable marker correlates with the posterior triangle enlarged lymph node on the right which measures 14 mm length. Palpable marker on the left likewise relates to an enlarged lymph node measuring 14 mm in length. Diffuse mild prominence of cervical lymph nodes with homogeneous density. CBC CMP LDH; EBV antibody- IgG elevated. Hepatitis/HIV workup- NEGATIVE   JULY 2025- 1. Lymph node, biopsy, right neck :  - LYMPH NODE WITH PREDOMINANTLY REACTIVE FEATURES.-Low clinical concerns of an malignancy especially the lymph node seem to be improving at this time.  # Hold off on excisional biopsy at this time.  Will follow-up in 6 months with clinical exam-and if improved hold off on imaging and discharge from the clinic at that time.  Patient seems to be in agreement.  # DISPOSITION: # follow up  in 6 months- MD; no labs- Dr.B  Above plan of care was discussed with patient/family in detail.  My contact information was given to the patient/family.       Cindy JONELLE Joe, MD 11/27/2023 10:24 AM

## 2023-11-27 NOTE — Assessment & Plan Note (Addendum)
 MAY 23rd, 2025- CT: Lymph nodes: Palpable marker correlates with the posterior triangle enlarged lymph node on the right which measures 14 mm length. Palpable marker on the left likewise relates to an enlarged lymph node measuring 14 mm in length. Diffuse mild prominence of cervical lymph nodes with homogeneous density. CBC CMP LDH; EBV antibody- IgG elevated. Hepatitis/HIV workup- NEGATIVE   JULY 2025- 1. Lymph node, biopsy, right neck :  - LYMPH NODE WITH PREDOMINANTLY REACTIVE FEATURES.-Low clinical concerns of an malignancy especially the lymph node seem to be improving at this time.  # Hold off on excisional biopsy at this time.  Will follow-up in 6 months with clinical exam-and if improved hold off on imaging and discharge from the clinic at that time.  Patient seems to be in agreement.  # DISPOSITION: # follow up  in 6 months- MD; no labs- Dr.B

## 2023-12-07 ENCOUNTER — Telehealth: Admitting: Family Medicine

## 2023-12-07 ENCOUNTER — Encounter: Payer: Self-pay | Admitting: Family Medicine

## 2023-12-07 DIAGNOSIS — J069 Acute upper respiratory infection, unspecified: Secondary | ICD-10-CM | POA: Diagnosis not present

## 2023-12-07 MED ORDER — BENZONATATE 100 MG PO CAPS: 100.0000 mg | ORAL_CAPSULE | Freq: Three times a day (TID) | ORAL | 0 refills | Status: DC | PRN

## 2023-12-07 MED ORDER — PROMETHAZINE-DM 6.25-15 MG/5ML PO SYRP: 5.0000 mL | ORAL_SOLUTION | Freq: Four times a day (QID) | ORAL | 0 refills | Status: DC | PRN

## 2023-12-07 NOTE — Patient Instructions (Addendum)
  Lori Lang, thank you for joining Lori CHRISTELLA Barefoot, NP for today's virtual visit.  While this provider is not your primary care provider (PCP), if your PCP is located in our provider database this encounter information will be shared with them immediately following your visit.   A [facility] MyChart account gives you access to today's visit and all your visits, tests, and labs performed at [facility]  click here if you don't have a [facility] MyChart account or go to [email or web address]  Consent: (Patient) Lori Lang provided verbal consent for this virtual visit at the beginning of the encounter.  Current Medications:  Current Outpatient Medications:    benzonatate  (TESSALON ) 100 MG capsule, Take 1 capsule (100 mg total) by mouth 3 (three) times daily as needed for cough., Disp: 30 capsule, Rfl: 0   promethazine -dextromethorphan (PROMETHAZINE -DM) 6.25-15 MG/5ML syrup, Take 5 mLs by mouth 4 (four) times daily as needed for cough., Disp: 118 mL, Rfl: 0   Cholecalciferol  (VITAMIN D3 PO), Take 1 tablet by mouth once a week., Disp: , Rfl:    cyclobenzaprine  (FLEXERIL ) 10 MG tablet, Take 1 tablet (10 mg total) by mouth at bedtime., Disp: 10 tablet, Rfl: 0   Semaglutide , 2 MG/DOSE, (OZEMPIC , 2 MG/DOSE,) 8 MG/3ML SOPN, Inject 2 mg into the skin once a week., Disp: 3 mL, Rfl: 2   Medications ordered in this encounter:  Meds ordered this encounter  Medications   promethazine -dextromethorphan (PROMETHAZINE -DM) 6.25-15 MG/5ML syrup    Sig: Take 5 mLs by mouth 4 (four) times daily as needed for cough.    Dispense:  118 mL    Refill:  0    Supervising Provider:   LAMPTEY, PHILIP O [[id number]]   benzonatate  (TESSALON ) 100 MG capsule    Sig: Take 1 capsule (100 mg total) by mouth 3 (three) times daily as needed for cough.    Dispense:  30 capsule    Refill:  0    Supervising Provider:   BLAISE ALEENE KIDD [[id number]]     *If you need refills on other medications prior to your  next appointment, please contact your pharmacy*  Follow-Up: Call back or seek an in-person evaluation if the symptoms worsen or if the condition fails to improve as anticipated.  [facility] [phone]  Other Instructions  URI recommendations: - Increased rest - Increasing Fluids - Acetaminophen  / ibuprofen  as needed for fever/pain.  - Saline nasal spray if congestion or if nasal passages feel dry. - Humidifying the air.     If you have been instructed to have an in-person evaluation today at a local Urgent Care facility, please use the link below. It will take you to a list of all of our available [facility], including address, phone number and hours of operation. Please do not delay care.  [facility]  If you or a family member do not have a primary care provider, use the link below to schedule a visit and establish care. When you choose a [facility] primary care physician or advanced practice provider, you gain a long-term partner in health. Find a Primary Care Provider  Learn more about [facility]'s in-office and virtual care options: [facility] - Get Care Now

## 2023-12-07 NOTE — Progress Notes (Signed)
 Virtual Visit Consent   Lori Lang, you are scheduled for a virtual visit with a [facility] provider today. Just as with appointments in the office, your consent must be obtained to participate. Your consent will be active for this visit and any virtual visit you may have with one of our providers in the next 365 days. If you have a MyChart account, a copy of this consent can be sent to you electronically.  As this is a virtual visit, video technology does not allow for your provider to perform a traditional examination. This may limit your provider's ability to fully assess your condition. If your provider identifies any concerns that need to be evaluated in person or the need to arrange testing (such as labs, EKG, etc.), we will make arrangements to do so. Although advances in technology are sophisticated, we cannot ensure that it will always work on either your end or our end. If the connection with a video visit is poor, the visit may have to be switched to a telephone visit. With either a video or telephone visit, we are not always able to ensure that we have a secure connection.  By engaging in this virtual visit, you consent to the provision of healthcare and authorize for your insurance to be billed (if applicable) for the services provided during this visit. Depending on your insurance coverage, you may receive a charge related to this service.  I need to obtain your verbal consent now. Are you willing to proceed with your visit today? Lori Lang has provided verbal consent on 12/07/2023 for a virtual visit (video or telephone). [name], NP  Date: 12/07/2023 9:30 AM   Virtual Visit via Video Note   I, [name], connected with  Lori Lang  ([id number], 12-Mar-1971) on 12/07/23 at  9:30 AM EDT by a video-enabled telemedicine application and verified that I am speaking with the correct person using two identifiers.  Location: Patient: Virtual Visit Location Patient:  Home Provider: Virtual Visit Location Provider: Home Office   I discussed the limitations of evaluation and management by telemedicine and the availability of in person appointments. The patient expressed understanding and agreed to proceed.    History of Present Illness: Lori Lang is a 53 y.o. who identifies as a female who was assigned female at birth, and is being seen today for cough   Onset was 4 days ago- head is stuffy, sore throat, and dry cough Associated symptoms are headache-sinus pressure, cough is the worse- keeping her up at night  Modifying factors are advil , OJ  Denies chest pain, shortness of breath, fevers, chills  Exposure to sick contacts- known with Cabin [employer] (not covid)  COVID test:  no     Problems:  Patient Active Problem List   Diagnosis Date Noted   HSV (herpes simplex virus) infection 09/15/2023   Lymphadenopathy of head and neck region 08/05/2023   Vaginal lesion 06/03/2023   Nodule of skin of finger of left hand 04/13/2023   Generalized abdominal pain 01/13/2023   S/P laparoscopic cholecystectomy 01/02/2023   Acute cholecystitis 12/23/2022   BMI 32.0-32.9,adult 09/27/2021   Type 2 diabetes mellitus without complication, without long-term current use of insulin (HCC) 09/27/2021   Obesity (BMI 30-39.9) 04/17/2021   COVID-19 04/17/2021   Frontal sinusitis 04/17/2021   Liver hemangioma 03/14/2021   Liver lesion 03/14/2021   Fatty liver 03/14/2021   Aortic atherosclerosis (HCC) 12/26/2019   Overweight (BMI 25.0-29.9) 12/22/2019  Lumbar radiculopathy 05/25/2019   Sciatica (Left) 07/14/2018   Chronic pain syndrome 07/14/2018   Preventative health care 07/14/2018   Disorder of skeletal system 07/14/2018   Problems influencing health status 07/14/2018   Failed back surgical syndrome 07/14/2018   Chronic low back pain (Secondary Area of Pain) (Left) w/ sciatica (Left) 07/14/2018   Chronic lower extremity pain (Primary area of Pain) (Left)  07/14/2018   Lumbar facet arthropathy 07/14/2018   DDD (degenerative disc disease), lumbosacral 07/14/2018   Leg cramps (Bilateral) 07/14/2018   Displacement of lumbar intervertebral disc (L5-S1) w/ radiculopathy (S1) (Left) 07/14/2018   Lumbar spondylosis 07/14/2018   Chronic musculoskeletal pain 07/14/2018   Neurogenic pain 07/14/2018   Abnormal MRI, lumbar spine 07/14/2018   Nocturnal leg cramps (Bilateral) 06/22/2018   Vitamin D  deficiency 08/31/2017   HLD (hyperlipidemia) 08/31/2017   Tobacco abuse 08/31/2017   Polycythemia 08/31/2017   Hot flashes 08/25/2017   Lumbosacral radiculopathy (S1) (Left) 08/25/2017    Allergies: No Known Allergies Medications:  Current Outpatient Medications:    Cholecalciferol  (VITAMIN D3 PO), Take 1 tablet by mouth once a week., Disp: , Rfl:    cyclobenzaprine  (FLEXERIL ) 10 MG tablet, Take 1 tablet (10 mg total) by mouth at bedtime., Disp: 10 tablet, Rfl: 0   Semaglutide , 2 MG/DOSE, (OZEMPIC , 2 MG/DOSE,) 8 MG/3ML SOPN, Inject 2 mg into the skin once a week., Disp: 3 mL, Rfl: 2  Observations/Objective: Patient is well-developed, well-nourished in no acute distress.  Resting comfortably  at home.  Head is normocephalic, atraumatic.  No labored breathing.  Speech is clear and coherent with logical content.  Patient is alert and oriented at baseline.    Assessment and Plan:  1. Viral URI with cough (Primary)  - promethazine -dextromethorphan (PROMETHAZINE -DM) 6.25-15 MG/5ML syrup; Take 5 mLs by mouth 4 (four) times daily as needed for cough.  Dispense: 118 mL; Refill: 0 - benzonatate  (TESSALON ) 100 MG capsule; Take 1 capsule (100 mg total) by mouth 3 (three) times daily as needed for cough.  Dispense: 30 capsule; Refill: 0   URI recommendations: - Increased rest - Increasing Fluids - Acetaminophen  / ibuprofen  as needed for fever/pain.  - Saline nasal spray if congestion or if nasal passages feel dry. - Humidifying the air.     Reviewed  side effects, risks and benefits of medication.    Patient acknowledged agreement and understanding of the plan.   Past Medical, Surgical, Social History, Allergies, and Medications have been Reviewed.    Follow Up Instructions: I discussed the assessment and treatment plan with the patient. The patient was provided an opportunity to ask questions and all were answered. The patient agreed with the plan and demonstrated an understanding of the instructions.  A copy of instructions were sent to the patient via MyChart unless otherwise noted below.     The patient was advised to call back or seek an in-person evaluation if the symptoms worsen or if the condition fails to improve as anticipated.    [name], NP

## 2023-12-22 ENCOUNTER — Other Ambulatory Visit: Payer: Self-pay

## 2024-01-04 ENCOUNTER — Telehealth: Payer: Self-pay

## 2024-01-04 NOTE — Telephone Encounter (Signed)
 Copied from CRM #[id number]. Topic: Appointments - Transfer of Care >> Jan 04, 2024  1:43 PM Ashley R wrote:   Pt is requesting to transfer FROM: Gretel App, NP  Pt is requesting to transfer UN:Xjlm, Charanpreet, NP Reason for requested transfer: Patient has health question she wants to discuss only with a provider. Unable to wait until next appt. With PCP. It is the responsibility of the team the patient would like to transfer to (Dr. Vincente) to reach out to the patient if for any reason this transfer is not acceptable.

## 2024-01-07 ENCOUNTER — Ambulatory Visit: Admitting: Podiatry

## 2024-01-07 DIAGNOSIS — M21962 Unspecified acquired deformity of left lower leg: Secondary | ICD-10-CM | POA: Diagnosis not present

## 2024-01-07 DIAGNOSIS — M2042 Other hammer toe(s) (acquired), left foot: Secondary | ICD-10-CM | POA: Diagnosis not present

## 2024-01-07 DIAGNOSIS — M21961 Unspecified acquired deformity of right lower leg: Secondary | ICD-10-CM

## 2024-01-07 NOTE — Progress Notes (Signed)
 Subjective:  Patient ID: Lori Lang, female    DOB: Sep 23, 1970,  MRN: [id number]  Chief Complaint  Patient presents with   Nail Problem    53 y.o. female presents with the above complaint.  Patient presents with left fifth digit hyperkeratotic lesion with painful to touch.  She just wanted get it evaluated she has a history of hammertoe surgery done in the past.  She has not seen MRIs prior to seeing me she also would like to discuss orthotics options.  She wears [employer].  She has flatfoot deformity.  She does not wear any kind of support.   Review of Systems: Negative except as noted in the HPI. Denies N/V/F/Ch.  Past Medical History:  Diagnosis Date   Chicken pox    COVID-19    03/2020, 03/2021   Family history of adverse reaction to anesthesia 1996   mother did not wake up   GERD (gastroesophageal reflux disease)    History of kidney stones    passed 3 , litrotrispy   Hyperlipidemia    Liver lesion    hemangioma vs FNH vs adenoma noted MRI 2008 (Dr. Jinny)    Pneumonia    Pre-diabetes    Prediabetes    Trigger finger of left thumb     Current Outpatient Medications:    benzonatate  (TESSALON ) 100 MG capsule, Take 1 capsule (100 mg total) by mouth 3 (three) times daily as needed for cough., Disp: 30 capsule, Rfl: 0   Cholecalciferol  (VITAMIN D3 PO), Take 1 tablet by mouth once a week., Disp: , Rfl:    cyclobenzaprine  (FLEXERIL ) 10 MG tablet, Take 1 tablet (10 mg total) by mouth at bedtime., Disp: 10 tablet, Rfl: 0   promethazine -dextromethorphan (PROMETHAZINE -DM) 6.25-15 MG/5ML syrup, Take 5 mLs by mouth 4 (four) times daily as needed for cough., Disp: 118 mL, Rfl: 0   Semaglutide , 2 MG/DOSE, (OZEMPIC , 2 MG/DOSE,) 8 MG/3ML SOPN, Inject 2 mg into the skin once a week., Disp: 3 mL, Rfl: 2  Social History   Tobacco Use  Smoking Status Every Day   Current packs/day: 1.00   Average packs/day: 1 pack/day for 30.0 years (30.0 ttl pk-yrs)   Types: Cigarettes   Smokeless Tobacco Never  Tobacco Comments   0.75PPD 05/01/2021    No Known Allergies Objective:  There were no vitals filed for this visit. There is no height or weight on file to calculate BMI. Constitutional Well developed. Well nourished.  Vascular Dorsalis pedis pulses palpable bilaterally. Posterior tibial pulses palpable bilaterally. Capillary refill normal to all digits.  No cyanosis or clubbing noted. Pedal hair growth normal.  Neurologic Normal speech. Oriented to person, place, and time. Epicritic sensation to light touch grossly present bilaterally.  Dermatologic Left fifth digit digit hyperkeratotic lesion noted with underlying previous hammertoe surgery noted.  No other abnormalities noted no open wounds or lesion noted.  Mild pain on left third digit ingrown noted.  Orthopedic: Normal joint ROM without pain or crepitus bilaterally. No visible deformities. No bony tenderness.   Radiographs: None Assessment:   1. Foot deformity, bilateral   2. Hammertoe of left foot    Plan:  Patient was evaluated and treated and all questions answered.  Left fifth digit hammertoe contracture - Questions and concerns were discussed with the patient in extensive detail given the presence of hammertoe contracture in the future she may need revisional surgery however for now shoe gear modification discussed -She will work with shoe gear modification and orthotics for  now  Pes planovalgus/foot deformity -I explained to patient the etiology of pes planovalgus and relationship with heel pain/arch pain and various treatment options were discussed.  Given patient foot structure in the setting of heel pain/arch pain I believe patient will benefit from custom-made orthotics to help control the hindfoot motion support the arch of the foot and take the stress away from arches.  Patient agrees with the plan like to proceed with orthotics -Patient was casted for orthotics   No follow-ups on  file.

## 2024-01-08 ENCOUNTER — Encounter: Admitting: Nurse Practitioner

## 2024-01-25 ENCOUNTER — Encounter: Payer: Self-pay | Admitting: Radiology

## 2024-01-28 ENCOUNTER — Telehealth: Payer: Self-pay | Admitting: Nurse Practitioner

## 2024-01-28 NOTE — Telephone Encounter (Signed)
 Copied from CRM [id number]. Topic: Appointments - Scheduling Inquiry for Clinic >> Jan 28, 2024  1:35 PM Ashley R wrote: Reason for CRM: Requesting Arnett or another available provider to speak with about recent lab results, underlying issues before January appt (scheduled with PCP). Unable to get wait list appt. in time callback [phone]   ----------------------------------------------------------------------- From previous Reason for Contact - Scheduling: Patient/patient representative is calling to schedule an appointment. Refer to attachments for appointment information.

## 2024-01-29 NOTE — Telephone Encounter (Signed)
Pt informed

## 2024-02-01 ENCOUNTER — Encounter: Payer: Self-pay | Admitting: Nurse Practitioner

## 2024-02-01 DIAGNOSIS — M7918 Myalgia, other site: Secondary | ICD-10-CM

## 2024-02-02 ENCOUNTER — Telehealth: Payer: Self-pay

## 2024-02-02 ENCOUNTER — Other Ambulatory Visit: Payer: Self-pay

## 2024-02-02 MED ORDER — CYCLOBENZAPRINE HCL 10 MG PO TABS
10.0000 mg | ORAL_TABLET | Freq: Every day | ORAL | 0 refills | Status: AC
  Filled 2024-02-02: qty 90, 90d supply, fill #0

## 2024-02-02 NOTE — Telephone Encounter (Signed)
 Patient called in to see the status of her order. Does not look like you have placed the order just yet. Patient was seen by Dr.Patel and scanned on her visit with him on 01/07/2024

## 2024-02-23 ENCOUNTER — Ambulatory Visit: Payer: Self-pay

## 2024-02-23 NOTE — Telephone Encounter (Signed)
 FYI Only or Action Required?: FYI only for provider: UC advised. Patient unable to schedule due to work.  Patient was last seen in primary care on 12/07/2023 by Moishe Chiquita HERO, NP.  Called Nurse Triage reporting Breast Pain.  Symptoms began a week ago.  Interventions attempted: Nothing.  Symptoms are: unchanged.  Triage Disposition: See Physician Within 24 Hours  Patient/caregiver understands and will follow disposition?: Yes  Copied from CRM #[id number]. Topic: Clinical - Red Word Triage >> Feb 23, 2024  3:30 PM China J wrote: [facility] that prompted transfer to Nurse Triage: Patient thinks she cracked a rib. Reason for Disposition  [1] Chest pain lasts < 5 minutes AND [2] NO chest pain or cardiac symptoms (e.g., breathing difficulty, sweating) now  (Exception: Chest pains that last only a few seconds.)  Answer Assessment - Initial Assessment Questions Tried to schedule 12/3 but due to work, patient unable to take appointment. UC advised.   1. LOCATION: Where does it hurt?       Right breast pain that has been happening for 1 1/2 ago when husband gave her a big hug to try to pop her back. 2. RADIATION: Does the pain go anywhere else? (e.g., into neck, jaw, arms, back)     Denies 3. ONSET: When did the chest pain begin? (Minutes, hours or days)      Constant for 1 1/2 weeks. 4. PATTERN: Does the pain come and go, or has it been constant since it started?  Does it get worse with exertion?      Has been constant 5. DURATION: How long does it last (e.g., seconds, minutes, hours)     Days 6. SEVERITY: How bad is the pain?  (e.g., Scale 1-10; mild, moderate, or severe)     6-7 9. CAUSE: What do you think is causing the chest pain?   Husband tried to pop her back and she felt a pop that has been hurting since.  Protocols used: Chest Pain-A-AH

## 2024-02-23 NOTE — Telephone Encounter (Signed)
 Pt stated she will try to go to an UC if need be she is scheduled to see pcp on the 11th

## 2024-02-29 NOTE — Telephone Encounter (Signed)
 Patient Orthotics are in.

## 2024-03-03 ENCOUNTER — Ambulatory Visit: Admitting: Nurse Practitioner

## 2024-03-13 ENCOUNTER — Encounter: Payer: Self-pay | Admitting: Nurse Practitioner

## 2024-03-19 ENCOUNTER — Encounter

## 2024-03-28 ENCOUNTER — Other Ambulatory Visit: Payer: Self-pay | Admitting: Nurse Practitioner

## 2024-03-28 DIAGNOSIS — E119 Type 2 diabetes mellitus without complications: Secondary | ICD-10-CM

## 2024-03-31 ENCOUNTER — Ambulatory Visit: Admitting: Nurse Practitioner

## 2024-04-01 MED ORDER — OZEMPIC (2 MG/DOSE) 8 MG/3ML ~~LOC~~ SOPN
2.0000 mg | PEN_INJECTOR | SUBCUTANEOUS | 2 refills | Status: AC
  Filled 2024-04-01 – 2024-04-28 (×7): qty 3, 28d supply, fill #0

## 2024-04-04 ENCOUNTER — Other Ambulatory Visit (HOSPITAL_COMMUNITY): Payer: Self-pay

## 2024-04-05 ENCOUNTER — Other Ambulatory Visit (HOSPITAL_COMMUNITY): Payer: Self-pay

## 2024-04-06 ENCOUNTER — Other Ambulatory Visit: Payer: Self-pay

## 2024-04-06 ENCOUNTER — Encounter: Payer: Self-pay | Admitting: Nurse Practitioner

## 2024-04-06 ENCOUNTER — Ambulatory Visit: Payer: Self-pay | Admitting: Nurse Practitioner

## 2024-04-06 VITALS — BP 108/74 | HR 73 | Temp 97.9°F | Ht 65.0 in | Wt 156.0 lb

## 2024-04-06 DIAGNOSIS — D1803 Hemangioma of intra-abdominal structures: Secondary | ICD-10-CM

## 2024-04-06 DIAGNOSIS — E119 Type 2 diabetes mellitus without complications: Secondary | ICD-10-CM | POA: Diagnosis not present

## 2024-04-06 DIAGNOSIS — Z720 Tobacco use: Secondary | ICD-10-CM | POA: Diagnosis not present

## 2024-04-06 DIAGNOSIS — E785 Hyperlipidemia, unspecified: Secondary | ICD-10-CM | POA: Diagnosis not present

## 2024-04-06 DIAGNOSIS — Z1329 Encounter for screening for other suspected endocrine disorder: Secondary | ICD-10-CM | POA: Diagnosis not present

## 2024-04-06 DIAGNOSIS — Z7985 Long-term (current) use of injectable non-insulin antidiabetic drugs: Secondary | ICD-10-CM

## 2024-04-06 DIAGNOSIS — E559 Vitamin D deficiency, unspecified: Secondary | ICD-10-CM | POA: Diagnosis not present

## 2024-04-06 DIAGNOSIS — R911 Solitary pulmonary nodule: Secondary | ICD-10-CM

## 2024-04-06 DIAGNOSIS — D751 Secondary polycythemia: Secondary | ICD-10-CM

## 2024-04-06 LAB — COMPREHENSIVE METABOLIC PANEL WITH GFR
ALT: 11 U/L (ref 3–35)
AST: 13 U/L (ref 5–37)
Albumin: 4.1 g/dL (ref 3.5–5.2)
Alkaline Phosphatase: 86 U/L (ref 39–117)
BUN: 13 mg/dL (ref 6–23)
CO2: 31 meq/L (ref 19–32)
Calcium: 9.6 mg/dL (ref 8.4–10.5)
Chloride: 102 meq/L (ref 96–112)
Creatinine, Ser: 0.69 mg/dL (ref 0.40–1.20)
GFR: 97.91 mL/min
Glucose, Bld: 70 mg/dL (ref 70–99)
Potassium: 4.3 meq/L (ref 3.5–5.1)
Sodium: 138 meq/L (ref 135–145)
Total Bilirubin: 0.7 mg/dL (ref 0.2–1.2)
Total Protein: 7.5 g/dL (ref 6.0–8.3)

## 2024-04-06 LAB — CBC WITH DIFFERENTIAL/PLATELET
Basophils Absolute: 0 K/uL (ref 0.0–0.1)
Basophils Relative: 0.4 % (ref 0.0–3.0)
Eosinophils Absolute: 0 K/uL (ref 0.0–0.7)
Eosinophils Relative: 1 % (ref 0.0–5.0)
HCT: 43.7 % (ref 36.0–46.0)
Hemoglobin: 15 g/dL (ref 12.0–15.0)
Lymphocytes Relative: 32.4 % (ref 12.0–46.0)
Lymphs Abs: 1.5 K/uL (ref 0.7–4.0)
MCHC: 34.4 g/dL (ref 30.0–36.0)
MCV: 89.1 fl (ref 78.0–100.0)
Monocytes Absolute: 0.6 K/uL (ref 0.1–1.0)
Monocytes Relative: 14 % — ABNORMAL HIGH (ref 3.0–12.0)
Neutro Abs: 2.4 K/uL (ref 1.4–7.7)
Neutrophils Relative %: 52.2 % (ref 43.0–77.0)
Platelets: 377 K/uL (ref 150.0–400.0)
RBC: 4.91 Mil/uL (ref 3.87–5.11)
RDW: 14 % (ref 11.5–15.5)
WBC: 4.6 K/uL (ref 4.0–10.5)

## 2024-04-06 LAB — LIPID PANEL
Cholesterol: 193 mg/dL (ref 28–200)
HDL: 44.8 mg/dL
LDL Cholesterol: 128 mg/dL — ABNORMAL HIGH (ref 10–99)
NonHDL: 148.67
Total CHOL/HDL Ratio: 4
Triglycerides: 102 mg/dL (ref 10.0–149.0)
VLDL: 20.4 mg/dL (ref 0.0–40.0)

## 2024-04-06 LAB — VITAMIN D 25 HYDROXY (VIT D DEFICIENCY, FRACTURES): VITD: 14.8 ng/mL — ABNORMAL LOW (ref 30.00–100.00)

## 2024-04-06 LAB — HEMOGLOBIN A1C: Hgb A1c MFr Bld: 5.7 % (ref 4.6–6.5)

## 2024-04-06 LAB — TSH: TSH: 0.64 u[IU]/mL (ref 0.35–5.50)

## 2024-04-06 MED ORDER — ROSUVASTATIN CALCIUM 5 MG PO TABS
5.0000 mg | ORAL_TABLET | Freq: Every day | ORAL | 3 refills | Status: AC
  Filled 2024-04-06 – 2024-04-08 (×3): qty 90, 90d supply, fill #0

## 2024-04-06 NOTE — Progress Notes (Signed)
 " Leron Glance, NP-C Phone: [phone]  Lori Lang is a 54 y.o. female who presents today for follow up.   She recently completed a CT Heart/Lung test through [facility] and would like to review the results. She was noted to have a right upper lobe pulmonary nodule, RADS category 2, benign. Recommended continued surveillance with annual low-dose CT chest. She was also found to have a 4 cm hypodense mass in the right hepatic dome. This is consistent with her known liver hemangioma. She has previously been referred to GI regarding this and was awaiting MRI options.  Her CAC score was noted to be 93 and in the 90th percentile rank. She is not currently on cholesterol or statin medication.   DIABETES Disease Monitoring: Blood Sugar ranges- Not checking Polyuria/phagia/dipsia- No      Optho- Yes Medications: Compliance- Ozempic  Hypoglycemic symptoms- No   Tobacco Use History[1]  Medications Ordered Prior to Encounter[2]   ROS see history of present illness  Objective  Physical Exam Vitals:   04/06/24 1130  BP: 108/74  Pulse: 73  Temp: 97.9 F (36.6 C)  SpO2: 97%    BP Readings from Last 3 Encounters:  04/06/24 108/74  11/27/23 111/83  10/15/23 118/68   Wt Readings from Last 3 Encounters:  04/06/24 156 lb (70.8 kg)  11/27/23 162 lb 4.8 oz (73.6 kg)  09/09/23 165 lb 6.4 oz (75 kg)    Physical Exam Constitutional:      General: She is not in acute distress.    Appearance: Normal appearance.  HENT:     Head: Normocephalic.  Cardiovascular:     Rate and Rhythm: Normal rate and regular rhythm.     Heart sounds: Normal heart sounds.  Pulmonary:     Effort: Pulmonary effort is normal.     Breath sounds: Normal breath sounds.  Skin:    General: Skin is warm and dry.  Neurological:     General: No focal deficit present.     Mental Status: She is alert.  Psychiatric:        Mood and Affect: Mood normal.        Behavior: Behavior normal.       Assessment/Plan: Please see individual problem list.  Type 2 diabetes mellitus without complication, without long-term current use of insulin (HCC) Assessment & Plan: She is on Ozempic  2 mg with good glycemic control and she has no hyperglycemia symptoms. Check A1c today. Continue Ozempic  2 mg weekly. Encourage healthy diet and regular exercise.   Orders: -     Hemoglobin A1c  Hyperlipidemia, unspecified hyperlipidemia type Assessment & Plan: Previously on Simvastatin  but stopped. CAC score of 93 in 90th percentile. Check lipid panel today. Start Crestor  5 mg daily. We will continue to monitor. Goal LDL under 70.   Orders: -     Rosuvastatin  Calcium ; Take 1 tablet (5 mg total) by mouth daily.  Dispense: 90 tablet; Refill: 3 -     Lipid panel  Liver hemangioma Assessment & Plan: CT Abd/Pelvis on 01/15/2022-  Hepatic steatosis with a 4.8 cm x 3.7 cm x 2.6 cm hemangioma within the liver dome. CT on 03/10/2024- 4 cm hypodense mass in the right hepatic dome is not fully characterized. Patient never followed up with GI previously. New referral placed for further evaluation and to discuss imaging due to need for sedation because of claustrophobia. Encouraged to schedule.  Orders: -     Comprehensive metabolic panel with GFR -  Ambulatory referral to Gastroenterology  Pulmonary nodule Assessment & Plan: CT Lung on 03/10/2024- 3 mm pleural-based right upper lobe pulmonary nodule. Lung-RADS category 2; benign exam. Recommend continued surveillance with annual low-dose CT chest. Referral placed to Pulmonology for annual lung cancer screening.   Orders: -     Ambulatory Referral for Lung Cancer Scre  Tobacco abuse Assessment & Plan: Currently smoking and not ready to quit. Discussed health risks and benefits of quitting. Encouraged cessation and offered support when ready. Referral placed for Lung Cancer Screening.   Orders: -     Ambulatory Referral for Lung Cancer  Scre  Polycythemia Assessment & Plan: Polycythemia is likely secondary to smoking, indicated by an elevated red blood cell count. The current level is not concerning but requires monitoring. Advise smoking cessation and monitor red blood cell count.  Orders: -     CBC with Differential/Platelet  Vitamin D  deficiency -     VITAMIN D  25 Hydroxy (Vit-D Deficiency, Fractures)  Thyroid  disorder screen -     TSH     Return in about 6 weeks (around 05/18/2024) for fasting labs then in 6 months for follow up.   Leron Glance, NP-C [facility] [place]      [1]  Social History Tobacco Use  Smoking Status Every Day   Current packs/day: 1.00   Average packs/day: 1 pack/day for 30.0 years (30.0 ttl pk-yrs)   Types: Cigarettes  Smokeless Tobacco Never  Tobacco Comments   0.75PPD 05/01/2021  [2]  Current Outpatient Medications on File Prior to Visit  Medication Sig Dispense Refill   Cholecalciferol  (VITAMIN D3 PO) Take 1 tablet by mouth once a week.     cyclobenzaprine  (FLEXERIL ) 10 MG tablet Take 1 tablet (10 mg total) by mouth at bedtime. 90 tablet 0   Semaglutide , 2 MG/DOSE, (OZEMPIC , 2 MG/DOSE,) 8 MG/3ML SOPN Inject 2 mg into the skin once a week. 3 mL 2   No current facility-administered medications on file prior to visit.   "

## 2024-04-07 ENCOUNTER — Other Ambulatory Visit: Payer: Self-pay

## 2024-04-08 ENCOUNTER — Other Ambulatory Visit: Payer: Self-pay

## 2024-04-08 ENCOUNTER — Encounter: Payer: Self-pay | Admitting: Nurse Practitioner

## 2024-04-08 ENCOUNTER — Other Ambulatory Visit (HOSPITAL_COMMUNITY): Payer: Self-pay

## 2024-04-08 ENCOUNTER — Ambulatory Visit: Payer: Self-pay | Admitting: Nurse Practitioner

## 2024-04-08 DIAGNOSIS — E559 Vitamin D deficiency, unspecified: Secondary | ICD-10-CM

## 2024-04-08 MED ORDER — VITAMIN D (ERGOCALCIFEROL) 1.25 MG (50000 UNIT) PO CAPS
50000.0000 [IU] | ORAL_CAPSULE | ORAL | 3 refills | Status: AC
  Filled 2024-04-08: qty 13, 90d supply, fill #0

## 2024-04-10 ENCOUNTER — Other Ambulatory Visit: Payer: Self-pay

## 2024-04-11 ENCOUNTER — Other Ambulatory Visit: Payer: Self-pay

## 2024-04-14 ENCOUNTER — Other Ambulatory Visit: Payer: Self-pay

## 2024-04-15 ENCOUNTER — Telehealth: Payer: Self-pay

## 2024-04-15 ENCOUNTER — Other Ambulatory Visit: Payer: Self-pay

## 2024-04-15 ENCOUNTER — Other Ambulatory Visit (HOSPITAL_COMMUNITY): Payer: Self-pay

## 2024-04-15 NOTE — Telephone Encounter (Signed)
 Pharmacy Patient Advocate Encounter   Received notification from [facility] that prior authorization for Ozempic  (2 MG/DOSE) 8MG /3ML pen-injectors is required/requested.   Insurance verification completed.   The patient is insured through [employer].   Per test claim: PA required; PA submitted to above mentioned insurance via Latent Key/confirmation #/EOC [id number] Status is pending

## 2024-04-18 ENCOUNTER — Other Ambulatory Visit (HOSPITAL_COMMUNITY): Payer: Self-pay

## 2024-04-19 ENCOUNTER — Telehealth: Payer: Self-pay | Admitting: *Deleted

## 2024-04-19 ENCOUNTER — Other Ambulatory Visit (HOSPITAL_COMMUNITY): Payer: Self-pay

## 2024-04-19 ENCOUNTER — Telehealth: Payer: Self-pay | Admitting: Pharmacist

## 2024-04-19 ENCOUNTER — Other Ambulatory Visit: Payer: Self-pay | Admitting: *Deleted

## 2024-04-19 DIAGNOSIS — Z87891 Personal history of nicotine dependence: Secondary | ICD-10-CM

## 2024-04-19 DIAGNOSIS — Z122 Encounter for screening for malignant neoplasm of respiratory organs: Secondary | ICD-10-CM

## 2024-04-19 DIAGNOSIS — F17210 Nicotine dependence, cigarettes, uncomplicated: Secondary | ICD-10-CM

## 2024-04-19 NOTE — Telephone Encounter (Signed)
 Pharmacy Patient Advocate Encounter   Received notification from CoverMyMeds that prior authorization for Mounjaro  2.5MG /0.5ML auto-injectors is due for renewal.   Insurance verification completed.   The patient is insured through [facility].  Action: Medication has been discontinued. Archived Key: [id number] Per chart notes, pt switched from Mounjaro  to Ozempic .

## 2024-04-19 NOTE — Telephone Encounter (Signed)
 Lung Cancer Screening Narrative/Criteria Questionnaire (Cigarette Smokers Only- No Cigars/Pipes/vapes)   Lori Lang   SDMV:04/20/24 9:30- Wells                                           [birth year]              LDCT: 04/27/24 2:00- DRIA    54 y.o.   Phone: [phone]  Lung Screening Narrative (confirm age 39-77 yrs Medicare / 50-80 yrs Private pay insurance)   Insurance information:[employer]   Referring Provider:Lester   This screening involves an initial phone call with a team member from our program. It is called a shared decision making visit. The initial meeting is required by insurance and Medicare to make sure you understand the program. This appointment takes about 15-20 minutes to complete. The CT scan will completed at a separate date/time. This scan takes about 5-10 minutes to complete and you may eat and drink before and after the scan.  Criteria questions for Lung Cancer Screening:   Are you a current or former smoker? Current Age began smoking: 45   If you are a former smoker, what year did you quit smoking? (within 15 yrs)   To calculate your smoking history, I need an accurate estimate of how many packs of cigarettes you smoked per day and for how many years. (Not just the number of PPD you are now smoking)   Years smoking 37 x Packs per day 1/2- 3/4 = Pack years 24   (at least 20 pack yrs)   (Make sure they understand that we need to know how much they have smoked in the past, not just the number of PPD they are smoking now)  Do you have a personal history of cancer?  No    Do you have a family history of cancer? No  Are you coughing up blood?  No  Have you had unexplained weight loss of 15 lbs or more in the last 6 months? No  It looks like you meet all criteria.     Additional information: N/A

## 2024-04-20 ENCOUNTER — Other Ambulatory Visit (HOSPITAL_COMMUNITY): Payer: Self-pay

## 2024-04-20 ENCOUNTER — Ambulatory Visit

## 2024-04-20 DIAGNOSIS — Z122 Encounter for screening for malignant neoplasm of respiratory organs: Secondary | ICD-10-CM

## 2024-04-20 DIAGNOSIS — Z720 Tobacco use: Secondary | ICD-10-CM

## 2024-04-20 NOTE — Progress Notes (Signed)
 Virtual Visit via Telephone Note  I connected with [name] on 04/20/24 at  9:30 AM EST by telephone and verified that I am speaking with the correct person using two identifiers.  Location: Patient: At home, in [place]  Provider: [address], [place], [place], [address]    I discussed the limitations, risks, security and privacy concerns of performing an evaluation and management service by telephone and the availability of in person appointments. I also discussed with the patient that there may be a patient responsible charge related to this service. The patient expressed understanding and agreed to proceed.  Shared Decision Making Visit Lung Cancer Screening Program [phone])   Eligibility: Age 54 y.o. Pack Years Smoking History Calculation 24 pack years  (# packs/per year x # years smoked) Recent History of coughing up blood  no Unexplained weight loss? no ( >Than 15 pounds within the last 6 months ) Prior History Lung / other cancer no (Diagnosis within the last 5 years already requiring surveillance chest CT Scans). Smoking Status Current Smoker Former Smokers: Years since quit: N/A  Quit Date: N/A  Visit Components: Discussion included one or more decision making aids. yes Discussion included risk/benefits of screening. yes Discussion included potential follow up diagnostic testing for abnormal scans. yes Discussion included meaning and risk of over diagnosis. yes Discussion included meaning and risk of False Positives. yes Discussion included meaning of total radiation exposure. yes  Counseling Included: Importance of adherence to annual lung cancer LDCT screening. yes Impact of comorbidities on ability to participate in the program. yes Ability and willingness to under diagnostic treatment. yes  Smoking Cessation Counseling: Current Smokers:  Discussed importance of smoking cessation. yes Information about tobacco cessation classes and interventions provided  to patient. yes Patient provided with ticket for LDCT Scan. N/A Symptomatic Patient. yes  Counseling(Intermediate counseling: > three minutes) [address]406 Diagnosis Code: Tobacco Use Z72.0 Asymptomatic Patient no  Counseling  Former Smokers:  Discussed the importance of maintaining cigarette abstinence. N/A Diagnosis Code: Personal History of Nicotine Dependence. S12.108 Information about tobacco cessation classes and interventions provided to patient. Yes Patient provided with ticket for LDCT Scan. N/A Written Order for Lung Cancer Screening with LDCT placed in Epic. Yes (CT Chest Lung Cancer Screening Low Dose W/O CM) [id number] Z12.2-Screening of respiratory organs Z87.891-Personal history of nicotine dependence  Lori Lang is a current user of tobacco or nicotine products. She is considering quitting at this time. Discussed resources like severties.nl and 1-800-QUITNOW. Counseling provided today addressed the risks of continued use and the benefits of cessation. Discussed tobacco/nicotine use history, readiness to quit, and evidence-based treatment options including behavioral strategies, support resources, and pharmacologic therapies. Provided encouragement and educational materials on steps and resources to quit smoking. Patient questions were addressed, and follow-up recommended for continued support. Total time spent on counseling: 4 minutes.   Wells CHRISTELLA [place], FNP

## 2024-04-20 NOTE — Patient Instructions (Signed)
 Thank you for participating in the [facility]ening [facility]. It was our pleasure to meet you today. We will call you with the results of your scan within the next few days. Your scan will be assigned a Lung RADS category score by the physicians reading the scans.  This Lung RADS score determines follow up scanning.  See below for description of categories, and follow up screening recommendations. We will be in touch to schedule your follow up screening annually or based on recommendations of our providers. We will fax a copy of your scan results to your Primary Care Physician, or the physician who referred you to the program, to ensure they have the results. Please call the office if you have any questions or concerns regarding your scanning experience or results.  Our office number is [phone]. Please speak with Karna Curly, RN., Karna Doom RN, or [facility] RN, and Isaiah Dover RN. They are  our Lung Cancer Screening RN.'s If They are unavailable when you call, Please leave a message on the voice mail. We will return your call at our earliest convenience.This voice mail is monitored several times a day.  Remember, if your scan is normal, we will scan you annually as long as you continue to meet the criteria for the program. (Age 6-80, Current smoker or smoker who has quit within the last 15 years). If you are a smoker, remember, quitting is the single most powerful action that you can take to decrease your risk of lung cancer and other pulmonary, breathing related problems. We know quitting is hard, and we are here to help.  Please let us  know if there is anything we can do to help you meet your goal of quitting. If you are a former smoker, counselling psychologist. We are proud of you! Remain smoke free! Remember you can refer friends or family members through the number above.  We will screen them to make sure they meet criteria for the program. Thank you for helping us   take better care of you by participating in Lung Screening.  Lung RADS Categories:  Lung RADS 1: no nodules or definitely non-concerning nodules.  Recommendation is for a repeat annual scan in 12 months.  Lung RADS 2:  nodules that are non-concerning in appearance and behavior with a very low likelihood of becoming an active cancer. Recommendation is for a repeat annual scan in 12 months.  Lung RADS 3: nodules that are probably non-concerning , includes nodules with a low likelihood of becoming an active cancer.  Recommendation is for a 68-month repeat screening scan. Often noted after an upper respiratory illness. We will be in touch to make sure you have no questions, and to schedule your 88-month scan.  Lung RADS 4 A: nodules with concerning findings, recommendation is most often for a follow up scan in 3 months or additional testing based on our provider's assessment of the scan. We will be in touch to make sure you have no questions and to schedule the recommended 3 month follow up scan.  Lung RADS 4 B:  indicates findings that are concerning. We will be in touch with you to schedule additional diagnostic testing based on our provider's  assessment of the scan.  Smoking Cessation  Tips to Quit:  Pick a Quit Day within the next week.  Remove temptations: toss cigarettes, lighters, ashtrays.  Tell someone you trust for support.  Avoid triggers like stress, boredom, or being around smokers.  Use healthy replacements: water,  gum, walking, deep breaths.  Stay busy with hobbies, music, drawing, or exercise.  Be patient with yourself--slipping once doesnt mean failure.   [facility] Smoking/Nicotine Cessation Resources  If youre ready to quit TODAY, our virtual care team is available to start your journey to a tobacco free life.  Appointments are available from 8 a.m. to 8 p.m. Monday to Friday. Most health insurance plans will cover some level of tobacco cessation visits and medications.   To make a virtual appointment and access other resources, follow the link: severties.nl   [facility]uit[facility] (1-800-QUITNOW) [facility] offers free combination nicotine replacement therapy (patches plus either gum or lozenges). When used along with counseling, this will more than double your chances of quitting successfully.  Call 1-800-QUIT NOW or Text: Ready to 34191.   Spanish language portal: 1-855-DEJELO-YA 234-175-7864).  TTY: 518-433-4701 [place] Quitline: Call 888-7AI-QUIT 2148201042) http://carroll-castaneda.info/  The American Lung Association offers Freedom From Smoking Programs Self guided or group programs offered, check their website for free virtual programs available to [place] residents Lung Helpline at 1-800-LUNGUSA  http://keith.biz/  [employer] Offers tools and tips to quit smoking.  Free quitSTART app:   Monitor progress, manage cravings, access tools, and more with the app.  portablegrid.se  Hypnosis for smoking cessation  [employer] 321-557-4923  Acupuncture for smoking cessation  [employer] 631 217 5617    Please let us  know how we can support you as you quit smoking. I know this is hard, but you can do this!    Your CT scan is scheduled for 04/27/2024 at 2 pm at [facility].

## 2024-04-21 ENCOUNTER — Telehealth: Payer: Self-pay | Admitting: Podiatry

## 2024-04-21 NOTE — Telephone Encounter (Signed)
 Orthotics in [facility] called LM on VM for pt to call to schedule PUO appt.

## 2024-04-27 ENCOUNTER — Ambulatory Visit

## 2024-04-27 ENCOUNTER — Other Ambulatory Visit (HOSPITAL_COMMUNITY): Payer: Self-pay

## 2024-04-28 ENCOUNTER — Encounter: Payer: Self-pay | Admitting: Nurse Practitioner

## 2024-04-28 ENCOUNTER — Other Ambulatory Visit: Payer: Self-pay

## 2024-04-28 DIAGNOSIS — R911 Solitary pulmonary nodule: Secondary | ICD-10-CM | POA: Insufficient documentation

## 2024-04-28 NOTE — Assessment & Plan Note (Signed)
 Polycythemia is likely secondary to smoking, indicated by an elevated red blood cell count. The current level is not concerning but requires monitoring. Advise smoking cessation and monitor red blood cell count.

## 2024-04-28 NOTE — Assessment & Plan Note (Addendum)
 CT Abd/Pelvis on 01/15/2022-  Hepatic steatosis with a 4.8 cm x 3.7 cm x 2.6 cm hemangioma within the liver dome. CT on 03/10/2024- 4 cm hypodense mass in the right hepatic dome is not fully characterized. Patient never followed up with GI previously. New referral placed for further evaluation and to discuss imaging due to need for sedation because of claustrophobia. Encouraged to schedule.

## 2024-04-28 NOTE — Assessment & Plan Note (Addendum)
 Currently smoking and not ready to quit. Discussed health risks and benefits of quitting. Encouraged cessation and offered support when ready. Referral placed for Lung Cancer Screening.

## 2024-04-28 NOTE — Assessment & Plan Note (Signed)
 CT Lung on 03/10/2024- 3 mm pleural-based right upper lobe pulmonary nodule. Lung-RADS category 2; benign exam. Recommend continued surveillance with annual low-dose CT chest. Referral placed to Pulmonology for annual lung cancer screening.

## 2024-04-28 NOTE — Assessment & Plan Note (Signed)
 Previously on Simvastatin  but stopped. CAC score of 93 in 90th percentile. Check lipid panel today. Start Crestor  5 mg daily. We will continue to monitor. Goal LDL under 70.

## 2024-04-28 NOTE — Assessment & Plan Note (Signed)
 She is on Ozempic  2 mg with good glycemic control and she has no hyperglycemia symptoms. Check A1c today. Continue Ozempic  2 mg weekly. Encourage healthy diet and regular exercise.

## 2024-04-29 ENCOUNTER — Other Ambulatory Visit (HOSPITAL_COMMUNITY): Payer: Self-pay

## 2024-05-12 ENCOUNTER — Ambulatory Visit: Admitting: Nurse Practitioner

## 2024-05-12 ENCOUNTER — Ambulatory Visit

## 2024-05-18 ENCOUNTER — Other Ambulatory Visit

## 2024-05-27 ENCOUNTER — Ambulatory Visit: Admitting: Internal Medicine

## 2024-10-05 ENCOUNTER — Ambulatory Visit: Admitting: Nurse Practitioner
